# Patient Record
Sex: Female | Born: 1955 | Race: White | Hispanic: No | Smoking: Former smoker
Health system: Southern US, Community
[De-identification: ages and names within clinical notes are randomized; demographics above are authoritative.]

## PROBLEM LIST (undated history)

## (undated) DIAGNOSIS — Z8709 Personal history of other diseases of the respiratory system: Secondary | ICD-10-CM

## (undated) DIAGNOSIS — D649 Anemia, unspecified: Secondary | ICD-10-CM

## (undated) DIAGNOSIS — Z8041 Family history of malignant neoplasm of ovary: Secondary | ICD-10-CM

## (undated) DIAGNOSIS — F32A Depression, unspecified: Secondary | ICD-10-CM

## (undated) DIAGNOSIS — I10 Essential (primary) hypertension: Secondary | ICD-10-CM

## (undated) DIAGNOSIS — Z862 Personal history of diseases of the blood and blood-forming organs and certain disorders involving the immune mechanism: Secondary | ICD-10-CM

## (undated) DIAGNOSIS — D219 Benign neoplasm of connective and other soft tissue, unspecified: Secondary | ICD-10-CM

## (undated) DIAGNOSIS — K219 Gastro-esophageal reflux disease without esophagitis: Secondary | ICD-10-CM

## (undated) DIAGNOSIS — Z973 Presence of spectacles and contact lenses: Secondary | ICD-10-CM

## (undated) DIAGNOSIS — Z8489 Family history of other specified conditions: Secondary | ICD-10-CM

## (undated) DIAGNOSIS — T7840XA Allergy, unspecified, initial encounter: Secondary | ICD-10-CM

## (undated) DIAGNOSIS — M1990 Unspecified osteoarthritis, unspecified site: Secondary | ICD-10-CM

## (undated) DIAGNOSIS — J189 Pneumonia, unspecified organism: Secondary | ICD-10-CM

## (undated) DIAGNOSIS — Z9889 Other specified postprocedural states: Secondary | ICD-10-CM

## (undated) DIAGNOSIS — F329 Major depressive disorder, single episode, unspecified: Secondary | ICD-10-CM

## (undated) DIAGNOSIS — R112 Nausea with vomiting, unspecified: Secondary | ICD-10-CM

## (undated) HISTORY — PX: ESOPHAGOGASTRODUODENOSCOPY: SHX1529

## (undated) HISTORY — DX: Allergy, unspecified, initial encounter: T78.40XA

## (undated) HISTORY — PX: JOINT REPLACEMENT: SHX530

## (undated) HISTORY — DX: Benign neoplasm of connective and other soft tissue, unspecified: D21.9

## (undated) HISTORY — PX: COLONOSCOPY: SHX174

## (undated) HISTORY — DX: Family history of malignant neoplasm of ovary: Z80.41

## (undated) HISTORY — DX: Personal history of diseases of the blood and blood-forming organs and certain disorders involving the immune mechanism: Z86.2

---

## 1998-11-18 ENCOUNTER — Other Ambulatory Visit: Admission: RE | Admit: 1998-11-18 | Discharge: 1998-11-18 | Payer: Self-pay | Admitting: Gynecology

## 2000-06-15 ENCOUNTER — Other Ambulatory Visit: Admission: RE | Admit: 2000-06-15 | Discharge: 2000-06-15 | Payer: Self-pay | Admitting: Gynecology

## 2000-06-21 ENCOUNTER — Encounter: Admission: RE | Admit: 2000-06-21 | Discharge: 2000-06-21 | Payer: Self-pay | Admitting: Gynecology

## 2000-06-21 ENCOUNTER — Encounter: Payer: Self-pay | Admitting: Gynecology

## 2000-06-28 ENCOUNTER — Encounter: Admission: RE | Admit: 2000-06-28 | Discharge: 2000-06-28 | Payer: Self-pay | Admitting: Sports Medicine

## 2000-07-01 ENCOUNTER — Encounter: Admission: RE | Admit: 2000-07-01 | Discharge: 2000-07-01 | Payer: Self-pay | Admitting: Sports Medicine

## 2000-07-01 ENCOUNTER — Encounter: Payer: Self-pay | Admitting: Sports Medicine

## 2000-07-05 ENCOUNTER — Encounter: Admission: RE | Admit: 2000-07-05 | Discharge: 2000-07-05 | Payer: Self-pay | Admitting: Sports Medicine

## 2000-07-06 ENCOUNTER — Encounter: Admission: RE | Admit: 2000-07-06 | Discharge: 2000-07-06 | Payer: Self-pay | Admitting: Sports Medicine

## 2000-07-30 ENCOUNTER — Encounter: Admission: RE | Admit: 2000-07-30 | Discharge: 2000-07-30 | Payer: Self-pay | Admitting: Sports Medicine

## 2000-08-30 ENCOUNTER — Encounter: Admission: RE | Admit: 2000-08-30 | Discharge: 2000-08-30 | Payer: Self-pay | Admitting: Sports Medicine

## 2000-09-27 ENCOUNTER — Encounter: Admission: RE | Admit: 2000-09-27 | Discharge: 2000-09-27 | Payer: Self-pay | Admitting: Sports Medicine

## 2001-06-16 ENCOUNTER — Encounter: Admission: RE | Admit: 2001-06-16 | Discharge: 2001-06-16 | Payer: Self-pay | Admitting: Family Medicine

## 2001-06-16 ENCOUNTER — Encounter: Payer: Self-pay | Admitting: Family Medicine

## 2001-09-13 ENCOUNTER — Other Ambulatory Visit: Admission: RE | Admit: 2001-09-13 | Discharge: 2001-09-13 | Payer: Self-pay | Admitting: Gynecology

## 2003-01-02 ENCOUNTER — Other Ambulatory Visit: Admission: RE | Admit: 2003-01-02 | Discharge: 2003-01-02 | Payer: Self-pay | Admitting: Gynecology

## 2003-01-31 ENCOUNTER — Encounter: Admission: RE | Admit: 2003-01-31 | Discharge: 2003-01-31 | Payer: Self-pay | Admitting: Sports Medicine

## 2004-06-04 ENCOUNTER — Other Ambulatory Visit: Admission: RE | Admit: 2004-06-04 | Discharge: 2004-06-04 | Payer: Self-pay | Admitting: Gynecology

## 2004-06-17 ENCOUNTER — Ambulatory Visit: Payer: Self-pay | Admitting: Sports Medicine

## 2004-06-30 ENCOUNTER — Encounter: Admission: RE | Admit: 2004-06-30 | Discharge: 2004-06-30 | Payer: Self-pay | Admitting: Gynecology

## 2004-09-26 ENCOUNTER — Ambulatory Visit: Payer: Self-pay | Admitting: Sports Medicine

## 2004-10-28 ENCOUNTER — Ambulatory Visit: Payer: Self-pay | Admitting: Sports Medicine

## 2005-11-03 ENCOUNTER — Other Ambulatory Visit: Admission: RE | Admit: 2005-11-03 | Discharge: 2005-11-03 | Payer: Self-pay | Admitting: Gynecology

## 2006-02-23 ENCOUNTER — Ambulatory Visit: Payer: Self-pay | Admitting: Family Medicine

## 2006-02-24 ENCOUNTER — Encounter: Admission: RE | Admit: 2006-02-24 | Discharge: 2006-02-24 | Payer: Self-pay | Admitting: Sports Medicine

## 2006-03-23 ENCOUNTER — Ambulatory Visit: Payer: Self-pay | Admitting: Family Medicine

## 2006-10-06 ENCOUNTER — Encounter: Admission: RE | Admit: 2006-10-06 | Discharge: 2006-10-06 | Payer: Self-pay | Admitting: Gynecology

## 2006-10-11 ENCOUNTER — Encounter: Admission: RE | Admit: 2006-10-11 | Discharge: 2006-10-11 | Payer: Self-pay | Admitting: Gynecology

## 2007-01-24 ENCOUNTER — Other Ambulatory Visit: Admission: RE | Admit: 2007-01-24 | Discharge: 2007-01-24 | Payer: Self-pay | Admitting: Gynecology

## 2007-11-03 ENCOUNTER — Encounter: Admission: RE | Admit: 2007-11-03 | Discharge: 2007-11-03 | Payer: Self-pay | Admitting: Gynecology

## 2008-08-07 ENCOUNTER — Ambulatory Visit: Payer: Self-pay | Admitting: Internal Medicine

## 2008-09-04 ENCOUNTER — Encounter: Admission: RE | Admit: 2008-09-04 | Discharge: 2008-09-04 | Payer: Self-pay | Admitting: Internal Medicine

## 2008-09-04 ENCOUNTER — Ambulatory Visit: Payer: Self-pay | Admitting: Internal Medicine

## 2008-12-05 ENCOUNTER — Encounter: Admission: RE | Admit: 2008-12-05 | Discharge: 2008-12-05 | Payer: Self-pay | Admitting: Gynecology

## 2009-05-20 ENCOUNTER — Ambulatory Visit: Payer: Self-pay | Admitting: Internal Medicine

## 2009-07-17 ENCOUNTER — Ambulatory Visit: Payer: Self-pay | Admitting: Sports Medicine

## 2009-07-17 DIAGNOSIS — M76899 Other specified enthesopathies of unspecified lower limb, excluding foot: Secondary | ICD-10-CM | POA: Insufficient documentation

## 2009-07-17 DIAGNOSIS — M1611 Unilateral primary osteoarthritis, right hip: Secondary | ICD-10-CM | POA: Insufficient documentation

## 2009-09-27 ENCOUNTER — Ambulatory Visit: Payer: Self-pay | Admitting: Internal Medicine

## 2010-01-28 ENCOUNTER — Ambulatory Visit: Payer: Self-pay | Admitting: Internal Medicine

## 2010-02-12 ENCOUNTER — Ambulatory Visit: Payer: Self-pay | Admitting: Internal Medicine

## 2010-03-04 ENCOUNTER — Ambulatory Visit: Payer: Self-pay | Admitting: Internal Medicine

## 2010-03-18 ENCOUNTER — Ambulatory Visit: Payer: Self-pay | Admitting: Internal Medicine

## 2010-03-19 ENCOUNTER — Encounter: Admission: RE | Admit: 2010-03-19 | Discharge: 2010-03-19 | Payer: Self-pay | Admitting: Internal Medicine

## 2010-06-24 ENCOUNTER — Encounter: Admission: RE | Admit: 2010-06-24 | Discharge: 2010-06-24 | Payer: Self-pay | Admitting: Gynecology

## 2010-07-17 ENCOUNTER — Ambulatory Visit: Payer: Self-pay | Admitting: Internal Medicine

## 2010-10-01 ENCOUNTER — Other Ambulatory Visit: Payer: Self-pay | Admitting: Family Medicine

## 2010-10-01 ENCOUNTER — Ambulatory Visit
Admission: RE | Admit: 2010-10-01 | Discharge: 2010-10-01 | Disposition: A | Discharge status: Home / Self Care | Payer: 59 | Source: Ambulatory Visit | Attending: Family Medicine | Admitting: Family Medicine

## 2010-10-01 ENCOUNTER — Ambulatory Visit (INDEPENDENT_AMBULATORY_CARE_PROVIDER_SITE_OTHER): Payer: 59 | Admitting: Sports Medicine

## 2010-10-01 ENCOUNTER — Encounter: Payer: Self-pay | Admitting: Sports Medicine

## 2010-10-01 DIAGNOSIS — M25559 Pain in unspecified hip: Secondary | ICD-10-CM

## 2010-10-01 DIAGNOSIS — M25539 Pain in unspecified wrist: Secondary | ICD-10-CM | POA: Insufficient documentation

## 2010-10-01 DIAGNOSIS — M545 Low back pain, unspecified: Secondary | ICD-10-CM

## 2010-10-01 DIAGNOSIS — M25519 Pain in unspecified shoulder: Secondary | ICD-10-CM

## 2010-10-08 NOTE — Assessment & Plan Note (Addendum)
Summary: HIP PAIN/MJD [phone]   Vital Signs:  Patient profile:   55 year old female BP sitting:   133 / 84  Vitals Entered By: Lillia Pauls CMA (October 01, 2010 8:42 AM)  History of Present Illness: 55 yo F h/o Rt troch bursitis injected by Jakiah Bienaime over 1 year ago here with multiple complaints.  1.  B/l low back pain - present for last 3-4 months.  Located at top of buttock.  Denies new injury.  Sometimes comes around lateral aspect of Rt hip and into groin, sometimes down leg into her knee.  Has not radiated into Lt hip or leg.  Pain with initially getting out of seated position and into standing position.  Pain gets so bad when walks sometimes that she has to stop.  2.  Rt hip/groin pain - see above.  has noticed some increasing pain in b/l lateral hips, Rt > Lt.  Also with Rt groin pain as above.  3.  B/l hand numbness/tingling - present for several months as well, seems to be getting worse.  Worst at night, gets this in thumb and index finger.  Doesn't feel like radiating from neck.  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  4.  Upper back/b/l shoulder pain - present for a couple months.  seems to have gotten worse after dad died 6 weeks ago, which is causing lots of stress.   Not sleeping well through the night.  Present constantly, worst with lying on her back.  Physical Exam  General:  overweight-appearing.   Head:  normocephalic.   Eyes:  vision grossly intact.   Neck:  supple.  FROM.  neg spurling's b/l Lungs:  normal respiratory effort.   Msk:  Shoulders: FROM.  Nl RC strength.  + spasm/TTP diffusely across b/l traps and parascapular muscles  Spine: + ttp b/l lower lumbar muscles. Mild TTP over SI joints.  No bony ttp.  Neg seated SLR seated and supine.  Limited ROM with flexion/ext/b/l lateral flexion.  Neuro: nl LE strength and reflexes  Hips: Nl ROM b/l.  Neg log roll b/l.  + Stinchfield test on Rt.  Rt sided TTP about buttock and lateral hip.  Wrists: nl ROM.  Mildly + Tinel's and Durkin's.  Strength and muscle tone intact Neurologic:  alert & oriented X3.     Impression & Recommendations:  Problem # 1:  HIP PAIN, RIGHT (ICD-719.45) Concerning for hip path such as OA. - check AP pelvis - mobic once daily - stretches and low impact exercise - f/u 1 month  Her updated medication list for this problem includes:    Mobic 15 Mg Tabs (Meloxicam) ..... One by mouth once daily  Orders: Radiology other (Radiology Other)  Problem # 2:  LUMBAGO (ICD-724.2) muscular in nature.  no evidence of nerve impingement. - mobic once daily - discussed ROM and strength exercises - f/u 1 month - I think amitriptyline will help this as well  Her updated medication list for this problem includes:    Mobic 15 Mg Tabs (Meloxicam) ..... One by mouth once daily  Problem # 3:  SHOULDER PAIN, BILATERAL (ICD-719.41) More upper back and parascapular pathology.  I think related to muscle tension, spasm, and stress - mobic once daily - amitriptyline 25 mg at bedtime and titrate up to effective sleep pattern - f/u 1 month  Her updated medication list for this problem includes:    Mobic 15 Mg Tabs (Meloxicam) ..... One by mouth once daily  Problem # 4:  WRIST PAIN, BILATERAL (ICD-719.43) sounds like she may have had h/o CTS and has used wrist braces before.  Does not appear to be cervical radiculopathy - mobic once daily - restart night time wrist splints - f/u 1 month  >30 min spent in face to face time, > 50% spent on diagnosis, prognosis, and treatment  Complete Medication List: 1)  Mobic 15 Mg Tabs (Meloxicam) .... One by mouth once daily 2)  Amitriptyline Hcl 25 Mg Tabs (Amitriptyline hcl) .... One at bedtime Prescriptions: AMITRIPTYLINE HCL 25 MG TABS (AMITRIPTYLINE HCL) ONE at bedtime  #60 x 0   Entered by:   Lillia Pauls CMA   Authorized by:   Corbin Ade MD   Signed by:   Lillia Pauls CMA on 10/01/2010   Method used:   Electronically to         [facility]* (retail)       [address]       [place], [place]  [id number]       Ph: [phone]       Fax: [phone]   RxID:   [id number] MOBIC 15 MG TABS (MELOXICAM) ONE by mouth once daily  #30 x 1   Entered by:   Lillia Pauls CMA   Authorized by:   Corbin Ade MD   Signed by:   Lillia Pauls CMA on 10/01/2010   Method used:   Electronically to        [facility]* (retail)       [address]       [place], [place]  [address]       Ph: [phone]       Fax: [phone]   RxID:   [id number]    Orders Added: 1)  Radiology other [Radiology Other] 2)  Est. Patient Level IV [23557]  Appended Document: HIP PAIN/MJD [phone] We will adjust her medicines.  Too groggy.  cut amitriptyline to 10 @ hs. Stop Mobic and use ibuprofen during day. Try 1 hydrocodone at hs as needed.

## 2010-10-14 ENCOUNTER — Encounter (INDEPENDENT_AMBULATORY_CARE_PROVIDER_SITE_OTHER): Payer: Self-pay | Admitting: *Deleted

## 2010-10-22 ENCOUNTER — Ambulatory Visit: Payer: 59 | Admitting: Sports Medicine

## 2010-10-23 NOTE — Miscellaneous (Signed)
  Clinical Lists Changes  Medications: Added new medication of AMITRIPTYLINE HCL 10 MG TABS (AMITRIPTYLINE HCL) take 1 by mouth at bedtime - Signed Added new medication of HYDROCODONE-ACETAMINOPHEN 5-325 MG TABS (HYDROCODONE-ACETAMINOPHEN) take 1-2 at bedtime as needed - Signed Rx of AMITRIPTYLINE HCL 10 MG TABS (AMITRIPTYLINE HCL) take 1 by mouth at bedtime;  #30 x 2;  Signed;  Entered by: Rochele Pages RN;  Authorized by: Enid Baas MD;  Method used: Electronically to [facility]*, [address], [place], [place]  [id number], Ph: [phone], Fax: [phone] Rx of HYDROCODONE-ACETAMINOPHEN 5-325 MG TABS (HYDROCODONE-ACETAMINOPHEN) take 1-2 at bedtime as needed;  #60 x 0;  Signed;  Entered by: Rochele Pages RN;  Authorized by: Enid Baas MD;  Method used: Telephoned to [facility]*, [address], [place], [place]  [address], Ph: [phone], Fax: [phone]    Prescriptions: HYDROCODONE-ACETAMINOPHEN 5-325 MG TABS (HYDROCODONE-ACETAMINOPHEN) take 1-2 at bedtime as needed  #60 x 0   Entered by:   Rochele Pages RN   Authorized by:   Enid Baas MD   Signed by:   Rochele Pages RN on 10/14/2010   Method used:   Telephoned to ...       [facility]* (retail)       [address]       [place], [place]  [address]       Ph: [phone]       Fax: [phone]   RxID:   [id number] AMITRIPTYLINE HCL 10 MG TABS (AMITRIPTYLINE HCL) take 1 by mouth at bedtime  #30 x 2   Entered by:   Rochele Pages RN   Authorized by:   Enid Baas MD   Signed by:   Rochele Pages RN on 10/14/2010   Method used:   Electronically to        [facility]* (retail)       [address]       [place], [place]  [address]       Ph: [phone]       Fax: [phone]   RxID:   [id number]

## 2010-10-29 ENCOUNTER — Encounter: Payer: Self-pay | Admitting: Sports Medicine

## 2010-10-29 ENCOUNTER — Ambulatory Visit (INDEPENDENT_AMBULATORY_CARE_PROVIDER_SITE_OTHER): Payer: 59 | Admitting: Sports Medicine

## 2010-10-29 DIAGNOSIS — M545 Low back pain, unspecified: Secondary | ICD-10-CM

## 2010-10-29 DIAGNOSIS — M25559 Pain in unspecified hip: Secondary | ICD-10-CM

## 2010-10-29 DIAGNOSIS — M25519 Pain in unspecified shoulder: Secondary | ICD-10-CM

## 2010-11-04 NOTE — Assessment & Plan Note (Signed)
Summary: F/U HIP/ARM,MC   Vital Signs:  Patient profile:   55 year old female Height:      68 inches Weight:      250 pounds BMI:     38.15 Pulse rate:   85 / minute BP sitting:   119 / 76  (right arm)  Vitals Entered By: Rochele Pages RN (October 29, 2010 9:01 AM) CC: f/u hips and shoulders   CC:  f/u hips and shoulders.  History of Present Illness: Pt presents to clinic for follow up of bilat hip, shoulder, and arm pain.   Hips are 40% improved.  Pain is more localized in low back and posterior hip area, instead of entire low body.  Flexion and twisting at the waist is painful. Pain less intense at night now.  Meloxicam is helpful.  Doing HEP, and trying to do more walking for exercise.  Went on trip to [place] last weekend, did significant walking.  Most pain in hips with she stopped walking. She still cannot touch her toes which she is normally able to do.  Arm/hand numbness  not improved- r>l.  Worse when lying down at night.  Amitriptylene very sedative for her, taking 10 mg instead of 25mg .    New bilat upper back pain but worse on the left. It is located between shoulder blades x1 week.    Preventive Screening-Counseling & Management  Alcohol-Tobacco     Smoking Status: quit  Allergies (verified): 1)  ! Codeine 2)  ! Tramadol Hcl  Social History: Smoking Status:  quit  Physical Exam  General:  Well-developed,well-nourished,in no acute distress; alert,appropriate and cooperative throughout examination. overweight-appearing.   Head:  no abnormalities observed.   Eyes:  vision grossly intact.   Ears:  no external deformities.   Nose:  no external deformity.   Lungs:  normal respiratory effort.   Msk:  Neck: Numbness in distribution of C5-C7 Tenderness over the trapezius worse on the left Full neck range of motion Grip strength and sensation normal in bilateral hands Strength good C4 to T1 distribution Reflexes normal  Shoulder: Inspection reveals no abnormalities,  atrophy or asymmetry. Palpation is normal with no tenderness over AC joint or bicipital groove. ROM is full in all planes. Rotator cuff strength normal throughout. No signs of impingement with negative Neer and Hawkin's tests, empty can. Normal scapular function observed. No painful arc and no drop arm sign.  Hip: ROM Seated her RT hip shows only 50 deg of rotatory motion w IR limited to 20 deg Left hip is normal at 40 deg ER and 25 deg IR  Strength IR: 5/5, ER: 5/5, Flexion: 5/5, Extension: 5/5, Abduction: 5/5, Adduction: 5/5 Pelvic alignment unremarkable to inspection and palpation. Standing hip rotation and gait without trendelenburg / unsteadiness. Greater trochanter without tenderness to palpation. No SI joint tenderness and normal minimal SI movement.  Back Exam: Inspection: Normal Motion: Flexion: pain at more than 60 deg Extension feels good and is normal SLR seated:                          SLR lying: XSLR seated:                        XSLR lying: Palpable tenderness: None         Impression & Recommendations:  Problem # 1:  HIP PAIN, RIGHT (ICD-719.45)  Her updated medication list for this problem includes:  Mobic 15 Mg Tabs (Meloxicam) ..... One by mouth once daily    Hydrocodone-acetaminophen 5-325 Mg Tabs (Hydrocodone-acetaminophen) .Marland Kitchen... 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 One by mouth once daily    Hydrocodone-acetaminophen 5-325 Mg Tabs (Hydrocodone-acetaminophen) .Marland Kitchen... Take 1-2 at bedtime as needed   I think most of her shoulder and arm pain is triggered by symptoms from her neck. While she has normal neck range of motion there is tightness over both trapezius muscles and particularly over the left.     we will give her a trial of the cervical collar to use intermittently. Use neck range of motion exercises. Also try tramadol again but only a half a tablet at night to see if she can tolerate this. I would like to be able to increase her dose of amitriptyline as this did help her arm numbness but she is not able to tolerate it secondary to nausea at normal doses  Problem # 3:  LUMBAGO (ICD-724.2)  Her updated medication list for this problem includes:    Mobic 15 Mg Tabs (Meloxicam) ..... One by mouth once daily    Hydrocodone-acetaminophen 5-325 Mg Tabs (Hydrocodone-acetaminophen) .Marland Kitchen... Take 1-2 at bedtime as needed   back is clearly doing better and so we'll primarily encouraged her to keep up labeled walking program and easy motion exercises. We will recheck in 2-3 months.  Complete Medication List: 1)  Mobic 15 Mg Tabs (Meloxicam) .... One by mouth once daily 2)  Amitriptyline Hcl 25 Mg Tabs (Amitriptyline hcl) .... One at bedtime 3)  Amitriptyline Hcl 10 Mg Tabs (Amitriptyline hcl) .... Take 1 by mouth at bedtime 4)  Hydrocodone-acetaminophen 5-325 Mg Tabs (Hydrocodone-acetaminophen) .... Take 1-2 at bedtime as needed   Orders Added: 1)  Est. Patient Level IV [98119]

## 2010-11-18 ENCOUNTER — Other Ambulatory Visit: Payer: Self-pay | Admitting: *Deleted

## 2010-11-18 MED ORDER — TRAMADOL HCL 50 MG PO TABS: ORAL_TABLET | ORAL | Status: DC

## 2010-12-04 ENCOUNTER — Ambulatory Visit (INDEPENDENT_AMBULATORY_CARE_PROVIDER_SITE_OTHER): Payer: 59 | Admitting: Sports Medicine

## 2010-12-04 ENCOUNTER — Encounter: Payer: Self-pay | Admitting: Sports Medicine

## 2010-12-04 VITALS — BP 137/79

## 2010-12-04 DIAGNOSIS — M5412 Radiculopathy, cervical region: Secondary | ICD-10-CM

## 2010-12-04 DIAGNOSIS — R200 Anesthesia of skin: Secondary | ICD-10-CM

## 2010-12-04 DIAGNOSIS — M25559 Pain in unspecified hip: Secondary | ICD-10-CM

## 2010-12-04 DIAGNOSIS — R209 Unspecified disturbances of skin sensation: Secondary | ICD-10-CM

## 2010-12-04 MED ORDER — CELECOXIB 200 MG PO CAPS: 200.0000 mg | ORAL_CAPSULE | Freq: Two times a day (BID) | ORAL | Status: DC

## 2010-12-04 NOTE — Assessment & Plan Note (Signed)
This has improved w Mobic We will try to find another medication that does not cause edema but helps Has used several NSAIDs Try celebrex 200 bid

## 2010-12-04 NOTE — Assessment & Plan Note (Signed)
Cont to use the collar as needed Try combination of 1/2 Vicodin and 1/2 tramadol tab at night as whole vicodin makes her too drowsy  Check cervical spine films  Reck after 4 to 6 wks

## 2010-12-04 NOTE — Progress Notes (Signed)
Subjective:    Patient ID: Stephanie Andrade, female    DOB: January 29, 1956, 55 y.o.   MRN: [id number]  HPI  Pt presents to clinic for f/u of meds. Was taking mobic- which was working well for lower extremity pain, but causes siginificant swelling.  Stopped this 10 days ago.  Take 25 mg tramadol and amitriptyline 10 mg this was working well, but caused drowsiness.  This spontaneously stopped working. Lt arm numbness is distally, rt involves entire arm with lying flat. Having hand numbness which is causing problems with her job.  Has to drop hands and reposition neck to regain feeling in hands.  Wearing cervical collar when she gets home from work- which is helpful, but does not help her sleep.   Has used hydrocodone 1/2 tablet for the past 4 nights, which helps her sleep.  Lower back and Hip symptoms improved while on Mobic and are stable now.    Review of Systems     Objective:   Physical Exam   Obese, NAD C5-T1 good strength Good biceps reflex bilat Good triceps reflex on lt, decreased on rt Neck ROM good    ROM left hip rotational is good;  RT ROM is about 50 deg total with 20 IR  No limp w walking    Assessment & Plan:

## 2010-12-04 NOTE — Patient Instructions (Signed)
Try taking 1/2 tramadol and 1/2 hydrocodone at bedtime Start celebrex 1-2 times daily Monitor if you are getting less sleepiness, and hand/arm numbness tingling.

## 2010-12-05 ENCOUNTER — Ambulatory Visit
Admission: RE | Admit: 2010-12-05 | Discharge: 2010-12-05 | Disposition: A | Discharge status: Home / Self Care | Payer: 59 | Source: Ambulatory Visit | Attending: Sports Medicine | Admitting: Sports Medicine

## 2010-12-05 DIAGNOSIS — R200 Anesthesia of skin: Secondary | ICD-10-CM

## 2011-01-15 ENCOUNTER — Encounter: Payer: Self-pay | Admitting: Sports Medicine

## 2011-01-15 ENCOUNTER — Ambulatory Visit (INDEPENDENT_AMBULATORY_CARE_PROVIDER_SITE_OTHER): Payer: BC Managed Care – PPO | Admitting: Sports Medicine

## 2011-01-15 DIAGNOSIS — M5412 Radiculopathy, cervical region: Secondary | ICD-10-CM

## 2011-01-15 DIAGNOSIS — M545 Low back pain, unspecified: Secondary | ICD-10-CM

## 2011-01-15 MED ORDER — METHOCARBAMOL 500 MG PO TABS: ORAL_TABLET | ORAL | Status: DC

## 2011-01-15 NOTE — Patient Instructions (Signed)
1) Start taking Robaxin 500mg  1 tab by mouth at bedtime - this is a muscle relaxant that should help with your hip/leg pain.  May take it up to every 6-hours if needed, but start mainly at bedtime. 2) Continue motrin as you are doing 3) Continue tramadol & vicodin as you are doing 4) Continue with home exercises & focus on some stretching to help with flexibility.  Yoga would be good to try. 5) Follow-up 4 to 6-weeks, call with any questions/concerns.

## 2011-01-15 NOTE — Assessment & Plan Note (Signed)
Right-sided low back pain with sciatica symptoms radiating into her right lower tremor the and hip - Will start Robaxin at bedtime to help with the symptoms. She is not tolerate amitriptyline in the past due to excessive sedation. - May continue her tramadol and Vicodin as she is doing. - Should continue with home exercises and stretching activities. Would benefit from yoga. - Followup 4-6 weeks.

## 2011-01-15 NOTE — Progress Notes (Signed)
Subjective:    Patient ID: Stephanie Andrade, female    DOB: 1955-09-03, 55 y.o.   MRN: [id number]  HPI 55 year old female to office for followup of cervical radiculopathy and low back pain/sciatica. Neck symptoms are proximally 70% improved, no longer having any numbness and tingling into the left arm, only has residual numbness in third and fourth fingers of her right hand. Has not needed to use her cervical collar for the last 3 weeks. Was taking Celebrex, but stopped 3 days ago because was making her feel funny. Has been using Motrin which is helpful. Also taking tramadol half tab plus Vicodin half tab at bedtime.  Continues to have right-sided low back pain radiating into her right posterior thigh and hip. This radiation does not past the knee. It is increased with activity. Tramadol and hydrocodone are helpful. Has been using Motrin since stopping Celebrex which helps some. Had tried amitriptyline in the past, but was over sedating. She is doing home exercises on a regular basis. Denies any change in bowel or bladder, denies any lower extremity numbness, denies fevers chills, denies saddle anesthesia.   Review of Systems Per history of present illness, otherwise negative    Objective:   Physical Exam GENERAL: Obese, no acute distress, pleasant MSK: - C-spine: Full range of motion without pain. No midline or paraspinal muscle tenderness. Negative Spurling's bilaterally. Normal upper extremity strength bilaterally. - L-spine: Decreased range of motion. Tender palpation along the right paraspinal musculature at L4, L5 and lower right SI joint and piriformis. Negative straight leg raise bilaterally. Normal large term strength. - Hips: Left hip with good range of motion. Right hip range of motion is restricted with 20 internal rotation and 50 external rotation. She has negative logroll. She is walking without a limp. NEURO: DTR +1/4 right triceps, otherwise +2/4 biceps bilaterally, brachial  radialis bilaterally, left tricep. Slightly decreased sensation to light touch over right third finger. VASCULAR:  pulses +2/4 radial artery bilaterally and symmetric        Assessment & Plan:

## 2011-01-15 NOTE — Assessment & Plan Note (Addendum)
Cervical radiculopathy with residual symptoms on the right along C7 distribution, no longer having symptoms on the left - May continue tramadol and Vicodin as she is doing - Rx for Robaxin given to take at bedtime, this is mainly for her hip/leg, but maybe helpful for her neck as well. 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 - Followup 4-6 weeks for reevaluation

## 2011-01-19 ENCOUNTER — Encounter: Payer: Self-pay | Admitting: Internal Medicine

## 2011-01-19 ENCOUNTER — Ambulatory Visit (INDEPENDENT_AMBULATORY_CARE_PROVIDER_SITE_OTHER): Payer: BC Managed Care – PPO | Admitting: Internal Medicine

## 2011-01-19 VITALS — BP 124/76 | HR 76 | Temp 99.5°F | Ht 64.0 in | Wt 285.0 lb

## 2011-01-19 DIAGNOSIS — J45909 Unspecified asthma, uncomplicated: Secondary | ICD-10-CM

## 2011-01-19 DIAGNOSIS — R5383 Other fatigue: Secondary | ICD-10-CM

## 2011-01-19 DIAGNOSIS — I10 Essential (primary) hypertension: Secondary | ICD-10-CM

## 2011-01-19 DIAGNOSIS — F329 Major depressive disorder, single episode, unspecified: Secondary | ICD-10-CM

## 2011-01-19 DIAGNOSIS — M5410 Radiculopathy, site unspecified: Secondary | ICD-10-CM

## 2011-01-19 DIAGNOSIS — R5381 Other malaise: Secondary | ICD-10-CM

## 2011-01-19 DIAGNOSIS — IMO0001 Reserved for inherently not codable concepts without codable children: Secondary | ICD-10-CM

## 2011-01-19 DIAGNOSIS — IMO0002 Reserved for concepts with insufficient information to code with codable children: Secondary | ICD-10-CM

## 2011-01-19 DIAGNOSIS — M791 Myalgia, unspecified site: Secondary | ICD-10-CM

## 2011-01-19 DIAGNOSIS — F32A Depression, unspecified: Secondary | ICD-10-CM

## 2011-01-19 LAB — COMPREHENSIVE METABOLIC PANEL
ALT: 22 U/L (ref 0–35)
AST: 19 U/L (ref 0–37)
Albumin: 4.5 g/dL (ref 3.5–5.2)
BUN: 17 mg/dL (ref 6–23)
CO2: 28 mEq/L (ref 19–32)
Calcium: 9.4 mg/dL (ref 8.4–10.5)
Chloride: 100 mEq/L (ref 96–112)
Creat: 0.63 mg/dL (ref 0.50–1.10)
Glucose, Bld: 110 mg/dL — ABNORMAL HIGH (ref 70–99)
Potassium: 3.8 mEq/L (ref 3.5–5.3)
Total Bilirubin: 0.3 mg/dL (ref 0.3–1.2)

## 2011-01-19 LAB — CBC WITH DIFFERENTIAL/PLATELET
Basophils Absolute: 0 10*3/uL (ref 0.0–0.1)
Basophils Relative: 0 % (ref 0–1)
Eosinophils Absolute: 0.2 10*3/uL (ref 0.0–0.7)
Hemoglobin: 12.2 g/dL (ref 12.0–15.0)
Lymphocytes Relative: 26 % (ref 12–46)
Lymphs Abs: 2 10*3/uL (ref 0.7–4.0)
MCV: 79 fL (ref 78.0–100.0)
Platelets: 243 10*3/uL (ref 150–400)
RBC: 4.72 MIL/uL (ref 3.87–5.11)

## 2011-01-19 LAB — CK TOTAL AND CKMB (NOT AT ARMC): CK, MB: 1.1 ng/mL (ref 0.3–4.0)

## 2011-01-20 ENCOUNTER — Encounter: Payer: Self-pay | Admitting: Internal Medicine

## 2011-01-20 LAB — SEDIMENTATION RATE: Sed Rate: 12 mm/hr (ref 0–22)

## 2011-01-20 LAB — ANA: Anti Nuclear Antibody(ANA): NEGATIVE

## 2011-02-09 ENCOUNTER — Ambulatory Visit (INDEPENDENT_AMBULATORY_CARE_PROVIDER_SITE_OTHER): Payer: BC Managed Care – PPO | Admitting: Internal Medicine

## 2011-02-09 ENCOUNTER — Encounter: Payer: Self-pay | Admitting: Internal Medicine

## 2011-02-09 VITALS — BP 118/70 | HR 78 | Temp 98.4°F | Ht 64.02 in | Wt 278.0 lb

## 2011-02-09 DIAGNOSIS — M5410 Radiculopathy, site unspecified: Secondary | ICD-10-CM

## 2011-02-09 DIAGNOSIS — J45909 Unspecified asthma, uncomplicated: Secondary | ICD-10-CM

## 2011-02-09 DIAGNOSIS — IMO0002 Reserved for concepts with insufficient information to code with codable children: Secondary | ICD-10-CM

## 2011-02-09 DIAGNOSIS — F3289 Other specified depressive episodes: Secondary | ICD-10-CM

## 2011-02-09 DIAGNOSIS — F329 Major depressive disorder, single episode, unspecified: Secondary | ICD-10-CM

## 2011-02-09 DIAGNOSIS — F32A Depression, unspecified: Secondary | ICD-10-CM

## 2011-02-09 DIAGNOSIS — I10 Essential (primary) hypertension: Secondary | ICD-10-CM

## 2011-02-09 MED ORDER — BUPROPION HCL ER (XL) 300 MG PO TB24: 300.0000 mg | ORAL_TABLET | ORAL | Status: DC

## 2011-02-11 ENCOUNTER — Encounter: Payer: Self-pay | Admitting: Internal Medicine

## 2011-02-11 DIAGNOSIS — F32A Depression, unspecified: Secondary | ICD-10-CM | POA: Insufficient documentation

## 2011-02-11 DIAGNOSIS — I10 Essential (primary) hypertension: Secondary | ICD-10-CM | POA: Insufficient documentation

## 2011-02-11 DIAGNOSIS — J45909 Unspecified asthma, uncomplicated: Secondary | ICD-10-CM | POA: Insufficient documentation

## 2011-02-11 DIAGNOSIS — F329 Major depressive disorder, single episode, unspecified: Secondary | ICD-10-CM | POA: Insufficient documentation

## 2011-02-11 NOTE — Patient Instructions (Signed)
Take Sterapred DS 12 day Dosepak as prescribed. We will obtain MRI of LS-spine as soon as possible and make further recommendations at that time

## 2011-02-11 NOTE — Patient Instructions (Signed)
Start Wellbutrin XL 150 mg daily. Return in 3 weeks

## 2011-02-11 NOTE — Progress Notes (Signed)
Subjective:    Patient ID: Stephanie Andrade, female    DOB: 1956/03/19, 55 y.o.   MRN: [id number]  HPI this is a followup visit regarding depression after death of her father, stress with alcoholic brother, chronic pain right buttock radiating down right posterior lateral leg. Also has history of asthma and hypertension. Remote history of sarcoidosis 1995. Who recently did a number of rheumatology studies because she was complaining of some diffuse pain and aching. ANA was negative, total CK within normal limits, CBC, comprehensive metabolic panel, TSH within normal limits. She was started on Wellbutrin XL 150 mg daily at last visit and is feeling better with regard to depression and decreased energy. She and her husband have been trying to walk in the late evenings. She is morbidly obese. She's been taking some tramadol for pain but it causes some nausea. If she takes it at bedtime she doesn't usually get nauseated. Sometimes she feels like her leg is going to give way and she's noticed some weakness in her right leg. She's also been taking some Robaxin. These were prescribed by Dr. Roanna Epley about a year ago. She has not had an MRI of her LS spine.    Review of Systems     Objective:   Physical Exam affect seems brighter. Straight leg raising positive on the right muscle strength 5/5 left lower extremity and 4/5 right lower extremity. Deep tendon reflexes in the knees 1+ and symmetrical;   absent in the ankles.        Assessment & Plan:  1-depression improved on Wellbutrin XL 150 mg daily. Continue with same dose. Patient says she has tried 300 mg in the past and did not do well on that.  2-right buttock and leg pain-this is been a somewhat confusing history. Apparently has tried Amitriptyline, Mobic, and Celebrex without relief or with side effects. Has been dealing with this apparently for about a year. It's been a bit difficult trying to sort out whether or not this was right hip pain  or radiculopathy the right leg but I feel at this point in time it's likely to be radiculopathy. We're going to get an MRI of the LS spine and reevaluate after that. We'll make further recommendations after MRI results are reviewed. In the meantime, she is going to take a Sterapred DS 10 mg 12 day Dosepak to see if that gives her any relief. She has Vicodin on hand which she takes sparingly for pain as it causes nausea  3-hypertension- well controlled on Benicar

## 2011-02-11 NOTE — Progress Notes (Signed)
Subjective:    Patient ID: Stephanie Andrade, female    DOB: 04-Oct-1955, 55 y.o.   MRN: [id number]  HPI patient in today for an acute visit complaining of "aching all over ". Admits to being depressed since the death of her father. Has recently learned that her brother a recovering alcoholic he started drinking once again. She has a known dog with chronic health problems that she is quite worried about as well. The dog is up a lot at night and patient is not sleeping well. She has a history of hypertension treated with Benicar/HCT 40/12.5 since 2008. She has a history of asthma and is on when necessary albuterol inhaler and uses Advair 250/50 twice a day. Remote history of sarcoidosis in 1995 but not symptomatic. Apparently has been seen at [name] in the past year and has been tried on amitriptyline, tramadol, hydrocodone, Mobic, Celebrex, Robaxin. Has pain in her right buttock and right hip radiating down her leg. I think initially it was felt that she may have had trochanteric bursitis. She had a hip x-ray of the neck x-ray she says. Has not had an MRI of her back. Was treated here 07/17/2010 for neck pain radiating down both arms for 2 weeks. Was given a 12 day Sterapred DS Dosepak, Vicodin and Flexeril. In July 2011 we treated her for low back strain with a Sterapred DS 10 mg 12 day Dosepak. Usually once or twice a year has a bout of asthmatic bronchitis. Patient's mother died with ovarian cancer. Patient sees Dr. Nicholas Andrade for GYN care.    Review of Systems     Objective:   Physical Exam patient has point tenderness over her right trochanter. Straight leg raising slightly positive on the left negative on the right at 90. Muscle strength 4/5 right lower extremity 5/ 5 left lower extremity. HEENT exam is negative neck is supple without thyromegaly chest clear cardiac exam regular rate and rhythm normal S1 and S2 extremities without edema        Assessment & Plan:  1-possible  radiculopathy right leg vs right trochanteric bursitis  2-depression  3-myalgias-? Myositis or other rheumatology illness remote history of sarcoidosis 1995. Could also be related to depression.? Possible fibromyalgia syndrome.  4-morbid obesity  5-hypertension Will control with Benicar/HCT  Plan is to do some baseline lab work including some rheumatology studies, thyroid functions. Start Wellbutrin XL 150 mg daily and reevaluate in a proximally 3 weeks. She says she's been on Wellbutrin in the remote past and it did well for her.

## 2011-03-01 ENCOUNTER — Ambulatory Visit
Admission: RE | Admit: 2011-03-01 | Discharge: 2011-03-01 | Disposition: A | Discharge status: Home / Self Care | Payer: BC Managed Care – PPO | Source: Ambulatory Visit | Attending: Internal Medicine | Admitting: Internal Medicine

## 2011-03-01 DIAGNOSIS — M5410 Radiculopathy, site unspecified: Secondary | ICD-10-CM

## 2011-03-10 ENCOUNTER — Encounter: Payer: Self-pay | Admitting: Internal Medicine

## 2011-03-10 ENCOUNTER — Other Ambulatory Visit: Payer: Self-pay | Admitting: *Deleted

## 2011-03-10 ENCOUNTER — Ambulatory Visit (INDEPENDENT_AMBULATORY_CARE_PROVIDER_SITE_OTHER): Payer: BC Managed Care – PPO | Admitting: Internal Medicine

## 2011-03-10 DIAGNOSIS — E669 Obesity, unspecified: Secondary | ICD-10-CM

## 2011-03-10 DIAGNOSIS — M5412 Radiculopathy, cervical region: Secondary | ICD-10-CM

## 2011-03-10 DIAGNOSIS — I10 Essential (primary) hypertension: Secondary | ICD-10-CM

## 2011-03-10 DIAGNOSIS — M48062 Spinal stenosis, lumbar region with neurogenic claudication: Secondary | ICD-10-CM | POA: Insufficient documentation

## 2011-03-10 DIAGNOSIS — J45909 Unspecified asthma, uncomplicated: Secondary | ICD-10-CM

## 2011-03-10 MED ORDER — METHOCARBAMOL 500 MG PO TABS: ORAL_TABLET | ORAL | Status: DC

## 2011-03-10 NOTE — Progress Notes (Signed)
Subjective:    Patient ID: Stephanie Andrade, female    DOB: [birth year], 55 y.o.   MRN: [id number]  HPI  patient in today for 2 issues. She has a history of asthma and frequent bouts of bronchitis. Says her breathing has been worse recently and she wakes up wheezing in the middle of the night. Reminded her she should be using Advair inhaler 250/50 twice daily. Doesn't like using albuterol inhaler because it makes her jittery.  Also was recently referred to neurosurgeon for evaluation consideration possibly of epidural steroid injections. She wants to see Dr. Lovell Sheehan. He has taken care of family members in the past. 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 Says the prednisone helped her back symptoms a great deal. She was to take another Sterapred dosepak. Says this will also help her breathing. Told her we could not keep her on chronic prednisone therapy. She is having some issues with pain control. She's been taking Robaxin, tramadol, Vicodin, and ibuprofen. Explained to her that we needed to come up with a regimen that was reasonable. She says some of these drugs make her drowsy if she takes them at work but she needs something to help her sleep.    Review of Systems     Objective:   Physical Exam chest is clear.dictation; cardiac exam regular rate and rhythm; extremities without edema. No change in back exam from previous visit        Assessment & Plan:  Asthma  Obesity  Hypertension  Chronic back pain due to multilevel spondylosis worst at L3-L4 where there is moderately severe central canal narrowing. Moderate to moderately severe central canal stenosis at L2-L3. Small foraminal protrusion at L4-L5 on the right causing mild foraminal narrowing.  Plan is to give her a note her Sterapred DS 10 mg 12 day dosepak. Schedule neurosurgery appointment after 03/20/2011. Refill tramadol 50 mg #60 patient is to take  one half to one by mouth twice daily in the morning and early afternoon. Try Tylox one capsule at bedtime (#30) no refill. Stop Robaxin. Stop Vicodin. Stop ibuprofen.

## 2011-03-11 ENCOUNTER — Telehealth: Payer: Self-pay

## 2011-03-11 NOTE — Telephone Encounter (Signed)
Dr. Baxley aware 

## 2011-04-01 ENCOUNTER — Telehealth: Payer: Self-pay

## 2011-04-01 NOTE — Telephone Encounter (Signed)
Patient would like a referrral to Dr. Herminio Heads. Is unhappy with [facility]. Spoke with Dr, Lenord Fellers via phone who gave me a verbal OK to do referral. Records faxed to them

## 2011-05-07 ENCOUNTER — Other Ambulatory Visit: Payer: Self-pay | Admitting: Sports Medicine

## 2011-05-21 ENCOUNTER — Other Ambulatory Visit: Payer: Self-pay | Admitting: Internal Medicine

## 2011-05-22 ENCOUNTER — Other Ambulatory Visit: Payer: Self-pay | Admitting: Internal Medicine

## 2011-06-16 ENCOUNTER — Telehealth: Payer: Self-pay | Admitting: Internal Medicine

## 2011-06-16 ENCOUNTER — Other Ambulatory Visit: Payer: Self-pay

## 2011-06-16 MED ORDER — ZOLPIDEM TARTRATE 10 MG PO TABS: 10.0000 mg | ORAL_TABLET | Freq: Every evening | ORAL | Status: DC | PRN

## 2011-06-16 NOTE — Telephone Encounter (Signed)
Rx for Ambien 10 mg #30 1/2 - 1 po qhs prn sleep sent to [facility].  Pt aware.

## 2011-07-07 ENCOUNTER — Encounter: Payer: Self-pay | Admitting: Internal Medicine

## 2011-07-07 ENCOUNTER — Ambulatory Visit (INDEPENDENT_AMBULATORY_CARE_PROVIDER_SITE_OTHER): Payer: BC Managed Care – PPO | Admitting: Internal Medicine

## 2011-07-07 DIAGNOSIS — R232 Flushing: Secondary | ICD-10-CM

## 2011-07-19 DIAGNOSIS — R232 Flushing: Secondary | ICD-10-CM | POA: Insufficient documentation

## 2011-07-19 NOTE — Patient Instructions (Signed)
Take prednisone taper and antibiotic sister corrected for bronchitis. Start estrogen replacement as directed as previously prescribed by GYN physician. Consider SSRI for depression

## 2011-07-19 NOTE — Progress Notes (Signed)
Subjective:    Patient ID: Stephanie Andrade, female    DOB: April 08, 1956, 55 y.o.   MRN: [id number]  HPI patient's brother died recently of complications of alcoholism. She had to euthanize her dog because of significant arthritis issues and age. Still grieving for father who died about a year ago. Complaining bitterly of hot flashes. She had epidural steroid injections done by Dr. Nickola Major in   [place] her back pain is better. Is on antidepressant therapy. Complains of fatigue. History of hypertension and asthma. History of depression which seems to be situational. Used to be on estrogen replacement but stopped it about a year ago. Says hot flashes are really unbearable and she's not sleeping. History of obesity but not motivated to diet. Some cough and respiratory congestion.    Review of Systems     Objective:   Physical Exam chest clear to auscultation; cardiac exam regular rate and rhythm; extremities without edema        Assessment & Plan:  Hot flashes related to menopause  Grief reaction  History of depression  Hypertension  History of low back pain  History of asthma  Bronchitis  Plan: Sterapred DS 10 mg 6 day dosepak; Levaquin 500 milligrams daily for 10 days. Restart estrogen replacement previously prescribed by GYN physician. Explained to patient that we could continue with this for about a year and that she should try to taper off again. Consider adding SSRI.

## 2011-08-31 ENCOUNTER — Other Ambulatory Visit: Payer: Self-pay

## 2011-08-31 MED ORDER — ZOLPIDEM TARTRATE 10 MG PO TABS: 10.0000 mg | ORAL_TABLET | Freq: Every evening | ORAL | Status: DC | PRN

## 2011-09-02 ENCOUNTER — Other Ambulatory Visit: Payer: Self-pay | Admitting: Physical Medicine and Rehabilitation

## 2011-09-02 DIAGNOSIS — R209 Unspecified disturbances of skin sensation: Secondary | ICD-10-CM

## 2011-09-02 DIAGNOSIS — M542 Cervicalgia: Secondary | ICD-10-CM

## 2011-09-02 DIAGNOSIS — M5412 Radiculopathy, cervical region: Secondary | ICD-10-CM

## 2011-09-02 DIAGNOSIS — M4802 Spinal stenosis, cervical region: Secondary | ICD-10-CM

## 2011-09-12 ENCOUNTER — Other Ambulatory Visit: Payer: BC Managed Care – PPO

## 2011-09-15 ENCOUNTER — Other Ambulatory Visit: Payer: Self-pay | Admitting: Internal Medicine

## 2011-09-24 ENCOUNTER — Other Ambulatory Visit: Payer: BC Managed Care – PPO

## 2011-10-08 ENCOUNTER — Other Ambulatory Visit: Payer: BC Managed Care – PPO

## 2011-10-16 ENCOUNTER — Other Ambulatory Visit: Payer: BC Managed Care – PPO | Admitting: Internal Medicine

## 2011-10-16 DIAGNOSIS — Z0000 Encounter for general adult medical examination without abnormal findings: Secondary | ICD-10-CM

## 2011-10-16 LAB — CBC WITH DIFFERENTIAL/PLATELET
Basophils Absolute: 0 10*3/uL (ref 0.0–0.1)
Basophils Relative: 1 % (ref 0–1)
Eosinophils Absolute: 0.2 10*3/uL (ref 0.0–0.7)
Eosinophils Relative: 3 % (ref 0–5)
HCT: 38 % (ref 36.0–46.0)
Hemoglobin: 12.2 g/dL (ref 12.0–15.0)
Lymphocytes Relative: 33 % (ref 12–46)
Lymphs Abs: 1.6 10*3/uL (ref 0.7–4.0)
MCH: 25.6 pg — ABNORMAL LOW (ref 26.0–34.0)
MCHC: 32.1 g/dL (ref 30.0–36.0)
MCV: 79.7 fL (ref 78.0–100.0)
Monocytes Absolute: 0.5 10*3/uL (ref 0.1–1.0)
Monocytes Relative: 10 % (ref 3–12)
Neutro Abs: 2.5 10*3/uL (ref 1.7–7.7)
Neutrophils Relative %: 52 % (ref 43–77)
Platelets: 212 10*3/uL (ref 150–400)
RBC: 4.77 MIL/uL (ref 3.87–5.11)
RDW: 13.1 % (ref 11.5–15.5)
WBC: 4.7 10*3/uL (ref 4.0–10.5)

## 2011-10-16 LAB — COMPREHENSIVE METABOLIC PANEL
ALT: 25 U/L (ref 0–35)
AST: 22 U/L (ref 0–37)
Albumin: 4.1 g/dL (ref 3.5–5.2)
Alkaline Phosphatase: 77 U/L (ref 39–117)
BUN: 20 mg/dL (ref 6–23)
CO2: 29 mEq/L (ref 19–32)
Calcium: 8.8 mg/dL (ref 8.4–10.5)
Chloride: 104 mEq/L (ref 96–112)
Creat: 0.88 mg/dL (ref 0.50–1.10)
Glucose, Bld: 85 mg/dL (ref 70–99)
Potassium: 4.6 mEq/L (ref 3.5–5.3)
Sodium: 139 mEq/L (ref 135–145)
Total Bilirubin: 0.4 mg/dL (ref 0.3–1.2)
Total Protein: 6.7 g/dL (ref 6.0–8.3)

## 2011-10-16 LAB — LIPID PANEL
Cholesterol: 158 mg/dL (ref 0–200)
HDL: 46 mg/dL (ref >39)
LDL Cholesterol: 89 mg/dL (ref 0–99)
Total CHOL/HDL Ratio: 3.4 Ratio
Triglycerides: 113 mg/dL (ref <150)
VLDL: 23 mg/dL (ref 0–40)

## 2011-10-16 LAB — TSH: TSH: 0.951 u[IU]/mL (ref 0.350–4.500)

## 2011-10-19 ENCOUNTER — Encounter: Payer: Self-pay | Admitting: Internal Medicine

## 2011-10-19 ENCOUNTER — Ambulatory Visit (INDEPENDENT_AMBULATORY_CARE_PROVIDER_SITE_OTHER): Payer: BC Managed Care – PPO | Admitting: Internal Medicine

## 2011-10-19 VITALS — BP 106/68 | HR 76 | Temp 98.2°F | Ht 68.0 in | Wt 267.0 lb

## 2011-10-19 DIAGNOSIS — F329 Major depressive disorder, single episode, unspecified: Secondary | ICD-10-CM

## 2011-10-19 DIAGNOSIS — D259 Leiomyoma of uterus, unspecified: Secondary | ICD-10-CM

## 2011-10-19 DIAGNOSIS — Z8639 Personal history of other endocrine, nutritional and metabolic disease: Secondary | ICD-10-CM

## 2011-10-19 DIAGNOSIS — D219 Benign neoplasm of connective and other soft tissue, unspecified: Secondary | ICD-10-CM

## 2011-10-19 DIAGNOSIS — F32A Depression, unspecified: Secondary | ICD-10-CM

## 2011-10-19 DIAGNOSIS — M545 Low back pain, unspecified: Secondary | ICD-10-CM

## 2011-10-19 DIAGNOSIS — Z8041 Family history of malignant neoplasm of ovary: Secondary | ICD-10-CM

## 2011-10-19 DIAGNOSIS — I10 Essential (primary) hypertension: Secondary | ICD-10-CM

## 2011-10-19 DIAGNOSIS — Z862 Personal history of diseases of the blood and blood-forming organs and certain disorders involving the immune mechanism: Secondary | ICD-10-CM

## 2011-10-19 DIAGNOSIS — J45909 Unspecified asthma, uncomplicated: Secondary | ICD-10-CM

## 2011-10-19 NOTE — Patient Instructions (Signed)
Continue same meds     Return in 6 months

## 2011-11-15 DIAGNOSIS — Z8639 Personal history of other endocrine, nutritional and metabolic disease: Secondary | ICD-10-CM | POA: Insufficient documentation

## 2011-11-15 DIAGNOSIS — Z862 Personal history of diseases of the blood and blood-forming organs and certain disorders involving the immune mechanism: Secondary | ICD-10-CM | POA: Insufficient documentation

## 2011-11-15 NOTE — Progress Notes (Signed)
Subjective:    Patient ID: Stephanie Andrade, female    DOB: 1956/07/18, 56 y.o.   MRN: [id number]  HPI 56 year old white female with history of hypertension, fibroids, family history of ovarian cancer, history of sarcoidosis diagnosed in 1995, history of asthma, history of vitamin D deficiency for health maintenance and evaluation of medical problems. Left heel fracture 2002. Bilateral carpal tunnel syndrome diagnosed 1997. Patient cannot eat mushrooms they cause hives. Codeine causes her to vomit. Cannot tolerate hydrocodone or Tessalon or dextromethorphan. Is on Benicar HCTZ since 2008 for hypertension. Dr. Nicholas Lose has her on Prometrium and Vivelle-Dot as estrogen replacement therapy. She has Advair inhaler to use twice daily 250/50 and an albuterol inhaler to use his rescue inhaler. Had Tdap Vaccine February 2011.  The past year has been hard for her. She lost her brother who had history of alcoholism and hypertension. A few months before that she lost her father who was in a skilled nursing facility at [facility] with dementia. Mother died at age 50 of ovarian cancer. She had put her dog to sleep the cause of orthopedic issues  Recently.  Social history patient does not smoke. Social alcohol consumption. Has 4 year college degree. Direct weddings and works for [employer]  Additional history of her father and brothers had a history of cholesterol issues. She has one sister in good health. Older brother who is living has hypertension   Review of Systems  Constitutional: Positive for fatigue.  HENT: Negative.   Eyes: Negative.   Respiratory:       History of asthma  Cardiovascular: Negative.   Genitourinary: Negative.   Musculoskeletal: Positive for back pain.  Neurological: Negative.   Hematological: Negative.   Psychiatric/Behavioral:       Depression       Objective:   Physical Exam  Vitals reviewed. Constitutional: She is oriented to person, place, and time. She appears  well-developed and well-nourished. No distress.  HENT:  Head: Normocephalic and atraumatic.  Right Ear: External ear normal.  Left Ear: External ear normal.  Nose: Nose normal.  Mouth/Throat: Oropharynx is clear and moist.  Eyes: Conjunctivae and EOM are normal. Pupils are equal, round, and reactive to light. Right eye exhibits no discharge. Left eye exhibits no discharge. No scleral icterus.  Neck: Neck supple. No JVD present. No thyromegaly present.  Cardiovascular: Normal rate, regular rhythm, normal heart sounds and intact distal pulses.   No murmur heard. Pulmonary/Chest: Effort normal and breath sounds normal. She has no wheezes. She has no rales.       Breasts normal female  Abdominal: Soft. Bowel sounds are normal. She exhibits no distension and no mass. There is no tenderness. There is no rebound and no guarding.  Genitourinary:       Pap deferred to GYN physician  Musculoskeletal: She exhibits no edema.  Lymphadenopathy:    She has no cervical adenopathy.  Neurological: She is alert and oriented to person, place, and time. She has normal reflexes. No cranial nerve deficit. Coordination normal.  Skin: Skin is warm and dry. No rash noted. She is not diaphoretic.  Psychiatric: She has a normal mood and affect. Her behavior is normal. Judgment and thought content normal.          Assessment & Plan:   hypertension  Asthma  Vitamin D deficiency  Family history of ovarian cancer in her mother  History of sarcoidosis diagnosed 1995-nonactive  History bilateral carpal tunnel syndrome 1997  Depression  History  of recurrent back pain  Plan: Patient is to return in 6 months or as needed. Colonoscopy was done in 2009 by Dr. Matthias Hughs.

## 2011-12-29 ENCOUNTER — Other Ambulatory Visit: Payer: Self-pay | Admitting: Internal Medicine

## 2012-02-26 ENCOUNTER — Other Ambulatory Visit: Payer: Self-pay | Admitting: Internal Medicine

## 2012-04-25 ENCOUNTER — Ambulatory Visit (INDEPENDENT_AMBULATORY_CARE_PROVIDER_SITE_OTHER): Payer: BC Managed Care – PPO | Admitting: Internal Medicine

## 2012-04-25 ENCOUNTER — Encounter: Payer: Self-pay | Admitting: Internal Medicine

## 2012-04-25 VITALS — BP 100/56 | HR 80 | Temp 98.8°F | Wt 263.0 lb

## 2012-04-25 DIAGNOSIS — L210 Seborrhea capitis: Secondary | ICD-10-CM

## 2012-04-25 DIAGNOSIS — L219 Seborrheic dermatitis, unspecified: Secondary | ICD-10-CM

## 2012-04-25 DIAGNOSIS — F32A Depression, unspecified: Secondary | ICD-10-CM

## 2012-04-25 DIAGNOSIS — Z23 Encounter for immunization: Secondary | ICD-10-CM

## 2012-04-25 DIAGNOSIS — J45909 Unspecified asthma, uncomplicated: Secondary | ICD-10-CM

## 2012-04-25 DIAGNOSIS — N951 Menopausal and female climacteric states: Secondary | ICD-10-CM

## 2012-04-25 DIAGNOSIS — F329 Major depressive disorder, single episode, unspecified: Secondary | ICD-10-CM

## 2012-04-25 DIAGNOSIS — E669 Obesity, unspecified: Secondary | ICD-10-CM

## 2012-04-25 DIAGNOSIS — M48061 Spinal stenosis, lumbar region without neurogenic claudication: Secondary | ICD-10-CM

## 2012-04-25 DIAGNOSIS — I10 Essential (primary) hypertension: Secondary | ICD-10-CM

## 2012-04-25 MED ORDER — PNEUMOCOCCAL VAC POLYVALENT 25 MCG/0.5ML IJ INJ: 0.5000 mL | INJECTION | INTRAMUSCULAR | Status: AC

## 2012-04-25 NOTE — Patient Instructions (Addendum)
Start Cozaar 50 mg daily when you run out of Benicar. Return after being on Cozaar for 30 days. Have pulmonary functions done. You have been given pneumovax vaccine today.

## 2012-05-05 ENCOUNTER — Encounter (HOSPITAL_COMMUNITY): Payer: BC Managed Care – PPO

## 2012-05-17 NOTE — Progress Notes (Signed)
Subjective:    Patient ID: Stephanie Andrade, female    DOB: May 21, 1956, 56 y.o.   MRN: [id number]  HPI 56 year old obese white female in today for followup of hypertension. She takes Benicar HCTZ. History of musculoskeletal pain which has improved. History of asthma for which she uses Advair inhaler, Singulair, albuterol inhaler. Remote history of sarcoidosis. History of lumbar spinal stenosis. History of hot flashes due to menopause. History of vitamin D deficiency. History of depression. History of insomnia. Complaining about cost of Benicar.  She has declined influenza immunization. Agrees to have Pneumovax immunization which was given today  Today complains of itchy scalp and says she's tried numerous over-the-counter products and also has tried Nizoral shampoo. I do not know what else to offer and she will need to see dermatologist for some special shampoo to which is likely to be tar based.    Review of Systems     Objective:   Physical Exam itchy flaky scalp consistent with seborrhea; chest clear to auscultation; no rales or wheezing; cardiac exam regular rate and rhythm normal S1 and S2. Extremities without edema. Affect is appropriate. Skin is warm and dry.        Assessment & Plan:  Obesity  Hypertension-stable on current regimen  Seborrhea of scalp  Musculoskeletal pain  Lumbar spinal stenosis  Depression  Insomnia  Asthma   Plan: Patient complaining of caused the Benicar. Switch to Cozaar 50 mg daily and return in 30 days. Otherwise Continue same medications. See dermatologist for seborrhea of scalp. Also [facility] is recommending you have pulmonary function testing and this will be arranged

## 2012-06-10 ENCOUNTER — Ambulatory Visit (INDEPENDENT_AMBULATORY_CARE_PROVIDER_SITE_OTHER): Payer: BC Managed Care – PPO | Admitting: Family Medicine

## 2012-06-10 VITALS — BP 100/60 | Ht 68.0 in | Wt 250.0 lb

## 2012-06-10 DIAGNOSIS — M629 Disorder of muscle, unspecified: Secondary | ICD-10-CM

## 2012-06-10 DIAGNOSIS — S93699A Other sprain of unspecified foot, initial encounter: Secondary | ICD-10-CM

## 2012-06-10 MED ORDER — NITROGLYCERIN 0.2 MG/HR TD PT24: MEDICATED_PATCH | TRANSDERMAL | Status: DC

## 2012-06-10 NOTE — Patient Instructions (Addendum)
Nitroglycerin Protocol   Apply 1/4 nitroglycerin patch to affected area daily.  Change position of patch within the affected area every 24 hours.  You may experience a headache during the first 1-2 weeks of using the patch, these should subside.  If you experience headaches after beginning nitroglycerin patch treatment, you may take your preferred over the counter pain reliever.  Another side effect of the nitroglycerin patch is skin irritation or rash related to patch adhesive.  Please notify our office if you develop more severe headaches or rash, and stop the patch.  Tendon healing with nitroglycerin patch may require 12 to 24 weeks depending on the extent of injury.  Men should not use if taking Viagra, Cialis, or Levitra.   Do not use if you have migraines or rosacea.   Please follow up in 1 month  Thank you for seeing [place] today!

## 2012-06-16 DIAGNOSIS — S93699A Other sprain of unspecified foot, initial encounter: Secondary | ICD-10-CM | POA: Insufficient documentation

## 2012-06-16 NOTE — Progress Notes (Signed)
Subjective:    Patient ID: Stephanie Andrade, female    DOB: [birth year], 56 y.o.   MRN: [id number]  HPI DOI  06/04/2012 Left heel pain that started acutely when she was walking on the beach. It was sharp and intense causing her to sit down., Felt nauseated. Had to sit there for quite a while until she was able to limp back to the ramp. Since then she is only been able to walk wearing one certain pair of shoes and even with that it is extremely painful. It throbs when she is at rest. When she walks on it is sharp and shooting.  PERTINENT  PMH / PSH: No prior history of injury or surgery to the left foot. Patient is not diabetic  Review of Systems Has noted no swelling of the left foot her she'll, no redness. She's had no fever, sweats or chills. No numbness in her toes on the left.    Objective:   Physical Exam  Vital signs are reviewed GENERAL: Well-developed female no acute distress mildly overweight FOOT: Left. Tender to palpation at the area of the origin of the left plantar fascia. Tenderness extends up into the lateral and medial portions of the calcaneus. Negative Tinel at the branching of the lateral and medial plantar cutaneous nerve. Distally she is neurovascularly intact. There is very small amount of soft tissue swelling noted on the heel back pad. ULTRASOUND: Fairly large disruption but not total tear of letter fascia. There is a lot of edema. There is increased vascularity is seen on Doppler. There is no cortical defect in the calcaneus.      Assessment & Plan:  Plantar fascia rupture traumatic. I'm surprised she's been able to walk on this at all. We will start nitroglycerin patch, I placed her in a sports insoles with scaphoid pads on that side and also added one to the other side because she has medial foot collapse occurring. Limited walking as needed. Elevation. Ice. We were some pain medicine and see her back in one to 2 weeks.

## 2012-06-29 ENCOUNTER — Other Ambulatory Visit: Payer: Self-pay | Admitting: Internal Medicine

## 2012-07-08 ENCOUNTER — Ambulatory Visit (INDEPENDENT_AMBULATORY_CARE_PROVIDER_SITE_OTHER): Payer: BC Managed Care – PPO | Admitting: Family Medicine

## 2012-07-08 VITALS — BP 108/70 | HR 80 | Ht 68.0 in | Wt 250.0 lb

## 2012-07-08 DIAGNOSIS — S93699A Other sprain of unspecified foot, initial encounter: Secondary | ICD-10-CM

## 2012-07-08 DIAGNOSIS — M629 Disorder of muscle, unspecified: Secondary | ICD-10-CM

## 2012-07-08 NOTE — Progress Notes (Signed)
History of present illness: Patient is a very pleasant 56 year old female who is following up for plantar fascia rupture. Patient states that she is approximately 50% better. Patient has been wearing a nitroglycerin patch and wearing a quarter of it at that time. Patient denies any side effects to this. Patient states that she has been walking a little more and is feeling much better in the sports insoles as well. Patient states that she's not taking any pain medications at this time and seems to be doing very well. Patient was told to stop doing any other activities other than just her regular daily activities. Patient denies any swelling or any numbness of the foot. Patient denies any nighttime awakenings.  Review of systems: 14 system review is done and unremarkable as related to the orthopedic problem.  Past medical history, social, surgical and family history all reviewed.    Physical exam Blood pressure 108/70, pulse 80, height 5\' 8"  (1.727 m), weight 250 lb (113.399 kg), last menstrual period 04/06/2011. General: No apparent distress alert and oriented x3 mood and affect somewhat normal but blunted. Respiratory: Patient's recent full sentences and does not appear short of breath Skin: Warm dry intact with no signs of infection or rash Neuro: Cranial nerves II through XII are intact, neurovascularly intact in all extremities with 2+ DTRs and 2+ pulses. Left foot: Patient does not have any erythema or any abnormalities on inspection. Patient does not have any swelling. Patient still is somewhat tender mostly over the medial portion of the calcaneus. Patient is tender along the fascia on the plantar aspect of the foot as well. She is neurovascularly intact distally and is able to bear weight.

## 2012-07-08 NOTE — Assessment & Plan Note (Signed)
Patient is doing considerably better at this time. Patient is having no side effects to the nitroglycerin patch so we'll increase to half a patch daily. In addition this patient was given stretches to start doing on the plantar fascia to try to help slowly. Patient also given exercises to do heel lifts as well as the eccentric. Discussed though that if she has any pain she should back off on this and go back to low activity. In addition if patient has side effects to the nitroglycerin patch at higher dose she will go back to the quarter of a patch daily. Patient will do these interventions and come back in 4-6 weeks for followup. At that time she should have a repeat ultrasound.

## 2012-08-19 ENCOUNTER — Encounter: Payer: Self-pay | Admitting: Sports Medicine

## 2012-08-19 ENCOUNTER — Ambulatory Visit: Payer: BC Managed Care – PPO | Admitting: Family Medicine

## 2012-08-19 ENCOUNTER — Ambulatory Visit (INDEPENDENT_AMBULATORY_CARE_PROVIDER_SITE_OTHER): Payer: BC Managed Care – PPO | Admitting: Sports Medicine

## 2012-08-19 VITALS — BP 121/74 | HR 87 | Ht 68.0 in | Wt 250.0 lb

## 2012-08-19 DIAGNOSIS — M5412 Radiculopathy, cervical region: Secondary | ICD-10-CM

## 2012-08-19 DIAGNOSIS — S93699A Other sprain of unspecified foot, initial encounter: Secondary | ICD-10-CM

## 2012-08-19 DIAGNOSIS — M629 Disorder of muscle, unspecified: Secondary | ICD-10-CM

## 2012-08-19 NOTE — Assessment & Plan Note (Signed)
Patient is now 10 weeks status post injury. On skin today patient does show some healing of the avulsion fracture but very minimal. At this point patient was fitted with new sports insoles and also given arch strap to see if this would be beneficial. Patient will stop doing weightbearing exercises at this time but will do more stretching. Patient can continue the nitroglycerin patch as long as Stephanie Andrade does not have any side effects. Patient will return again in 4 weeks' time for further evaluation. At that time we can consider another ultrasound.

## 2012-08-19 NOTE — Progress Notes (Signed)
History of present illness: Patient is a very pleasant 57 year old female who is following up for plantar fascia rupture. Patient was seen one month ago and did increase her nitroglycerin patch to half patch daily. Patient was also started on her exercises at that time. Since then though she has not made any improvement in states at the exercises seem to be more painful than beneficial. Patient denies any new symptoms such as swelling or numbness of the foot. Patient is still able to do all her activities of daily living. Patient feels that the sports insoles that she was given has broken down at this time and is not helping her like they did previously.  Patient states that she's not taking any pain medications at this time and seems to be doing very well. Patient was told to stop doing any other activities other than just her regular daily activities. Patient denies any swelling or any numbness of the foot. Patient denies any nighttime awakenings.  Patient also has a history of cervical radiculopathy with radiation down the left arm. Patient was wearing a c-collar at night which seemed to be beneficial. Patient is wondering if she can get a new collar today. Denies any new symptoms such as weakening of the upper extremity or worsening pain. Patient states that it only seems to be giving her trouble at night and when she wakes up she has some numbness in the left opportunity.  Review of systems: 14 system review is done and unremarkable as related to the orthopedic problem.  Past medical history, social, surgical and family history all reviewed.    Physical exam Blood pressure 121/74, pulse 87, height 5\' 8"  (1.727 m), weight 250 lb (113.399 kg), last menstrual period 04/06/2011. General: No apparent distress alert and oriented x3 mood and affect somewhat normal but blunted. Respiratory: Patient's recent full sentences and does not appear short of breath Skin: Warm dry intact with no signs of infection  or rash Neuro: Cranial nerves II through XII are intact, neurovascularly intact in all extremities with 2+ DTRs and 2+ pulses. Left foot: Patient does not have any erythema or any abnormalities on inspection. Patient does not have any swelling. Patient still is somewhat tender mostly over the medial portion of the calcaneus. Patient is tender along the fascia on the plantar aspect of the foot as well but much more focal near the calcaneus. She is neurovascularly intact distally and is able to bear weight.  Musculoskeletal ultrasound was performed and interpreted by me today. Patient still has what appears to be an avulsion rupture of the plantar fascia in the middle third of the calcaneus. Patient still has a defect in measures approximately 0.5 cm in length. Patient does have some mild hypoechoic changes but no neovascularization occurring in the area. Patient does have some increasing calcifications at the area of the avulsion signaling some healing properties.

## 2012-08-19 NOTE — Assessment & Plan Note (Signed)
Patient was not examined today but was given a cervical soft collar to help her with her symptoms. Patient though she to return again for further evaluation if this does not seem to relieve any of the pressure.

## 2012-08-19 NOTE — Patient Instructions (Signed)
It is good to see you and happy new year! I am sorry we are not perfect yet. We do see some healing on the scan today.  You can continue the nitro patch if you want.  Start the exercises again but not with your body weight.  Try the new insoles and lets see if that helps.  Try the cervical neck collar for sleeping for your neck. If it doesn't work we do have some medicine to help.  Lets have you come back again in 4 weeks.

## 2012-09-15 ENCOUNTER — Ambulatory Visit (INDEPENDENT_AMBULATORY_CARE_PROVIDER_SITE_OTHER): Payer: BC Managed Care – PPO | Admitting: Sports Medicine

## 2012-09-15 VITALS — BP 125/80 | Ht 68.0 in | Wt 240.0 lb

## 2012-09-15 DIAGNOSIS — S93699A Other sprain of unspecified foot, initial encounter: Secondary | ICD-10-CM

## 2012-09-15 DIAGNOSIS — M629 Disorder of muscle, unspecified: Secondary | ICD-10-CM

## 2012-09-15 DIAGNOSIS — M2420 Disorder of ligament, unspecified site: Secondary | ICD-10-CM

## 2012-09-15 DIAGNOSIS — M5412 Radiculopathy, cervical region: Secondary | ICD-10-CM

## 2012-09-15 NOTE — Progress Notes (Signed)
Patient ID: Stephanie Andrade, female   DOB: 1956-03-25, 57 y.o.   MRN: [id number]  Patient returns for followup of 2 issues.  Cervical radiculopathy- This is doing better. She occasionally takes some ibuprofen which helps but does not regularly use any medications. 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 The biggest challenge comes with sleep if her neck gets in an awkward position she will wake up with more symptoms into her left arm most the time but sometimes the right.  Plantar fascial rupture This is gradually but steadily improved to where she still has some pain but not nearly as severe and she doesn't feel that she has to limp. The pain is less on the medial side of the heel and less on the first step in the morning. There is some pain on the outside of the heel. Arch supports help. The exercises in a seated position it seemed to help.  Physical examination Pleasant and in no acute distress Neck motion Forward flexion extension rotation and lateral bending No pain on motion C4-T1 nerve roots are tested and she has good strength for all  Left plantar fascia Mild tenderness to palpation at the medial insertion No tenderness to palpation at the lateral insertion walking without a significant limp

## 2012-09-15 NOTE — Assessment & Plan Note (Signed)
Uses collar as needed to lessen symptoms  Purchase a pillow to help with sleep at night  Watch her posture and keep up the neck range of motion

## 2012-09-15 NOTE — Assessment & Plan Note (Signed)
Has improved  Use sports shoes but can start using some other shoes with good arch  Add an arch strap  Began home exercise program now with weightbearing  Recheck when necessary if not resolved in 3 months

## 2012-12-02 ENCOUNTER — Other Ambulatory Visit: Payer: Self-pay | Admitting: Internal Medicine

## 2013-02-07 ENCOUNTER — Other Ambulatory Visit: Payer: Self-pay | Admitting: Internal Medicine

## 2013-03-06 ENCOUNTER — Encounter: Payer: Self-pay | Admitting: Internal Medicine

## 2013-03-06 ENCOUNTER — Ambulatory Visit (INDEPENDENT_AMBULATORY_CARE_PROVIDER_SITE_OTHER): Payer: No Typology Code available for payment source | Admitting: Internal Medicine

## 2013-03-06 VITALS — BP 126/78 | HR 92 | Temp 99.6°F | Wt 269.0 lb

## 2013-03-06 DIAGNOSIS — J069 Acute upper respiratory infection, unspecified: Secondary | ICD-10-CM

## 2013-03-06 DIAGNOSIS — J029 Acute pharyngitis, unspecified: Secondary | ICD-10-CM

## 2013-03-06 MED ORDER — PREDNISONE 10 MG PO KIT: PACK | ORAL | Status: DC

## 2013-03-06 MED ORDER — LEVOFLOXACIN 500 MG PO TABS: 500.0000 mg | ORAL_TABLET | Freq: Every day | ORAL | Status: DC

## 2013-03-06 NOTE — Patient Instructions (Addendum)
Take Levaquin 500 mg daily for 7 days. If wheezing develops, begin Prednisone dose pack as directed.

## 2013-03-06 NOTE — Progress Notes (Signed)
Subjective:    Patient ID: Stephanie Andrade, female    DOB: 12-05-1955, 57 y.o.   MRN: [id number]  HPI 57 year old white female with history of hypertension in today with complaint of sore throat onset yesterday. Has malaise and fatigue despite just having been on vacation last week. No fever or shaking chills. Has developed a bit of a cough that is nonproductive. No wheezing.    Review of Systems     Objective:   Physical Exam HEENT exam: TMs are clear. Pharynx is red. Rapid strep screen is negative. Neck is supple without significant adenopathy. Chest clear to auscultation without rales or wheezing.        Assessment & Plan:  Acute URI  Pharyngitis  Hypertension-well-controlled on current regimen  Plan: Levaquin 500 milligrams daily for 7 days. If develops wheezing or protracted coughing, take Sterapred DS 10 mg 12 day dosepak. Needs physical exam Fall 2014

## 2013-03-07 ENCOUNTER — Other Ambulatory Visit: Payer: Self-pay | Admitting: Internal Medicine

## 2013-03-16 ENCOUNTER — Ambulatory Visit (INDEPENDENT_AMBULATORY_CARE_PROVIDER_SITE_OTHER): Payer: No Typology Code available for payment source | Admitting: Sports Medicine

## 2013-03-16 VITALS — BP 124/70 | Ht 68.0 in | Wt 260.0 lb

## 2013-03-16 DIAGNOSIS — S93699A Other sprain of unspecified foot, initial encounter: Secondary | ICD-10-CM

## 2013-03-16 DIAGNOSIS — M25551 Pain in right hip: Secondary | ICD-10-CM

## 2013-03-16 DIAGNOSIS — M25559 Pain in unspecified hip: Secondary | ICD-10-CM

## 2013-03-16 DIAGNOSIS — M629 Disorder of muscle, unspecified: Secondary | ICD-10-CM

## 2013-03-16 MED ORDER — DIAZEPAM 5 MG PO TABS: ORAL_TABLET | ORAL | Status: DC

## 2013-03-16 NOTE — Assessment & Plan Note (Signed)
Hx of PF rupture but current pain seems less likely to be PF and more likely related to gait and progressive calcaneal valgus Pressure over medial heel Wedges placed in shoes Arch strap  Return to have custom orthotics

## 2013-03-16 NOTE — Assessment & Plan Note (Signed)
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

## 2013-03-16 NOTE — Progress Notes (Signed)
Patient ID: Stephanie Andrade, female   DOB: 20-Jan-1956, 57 y.o.   MRN: [id number]  Cervical radiculopathy Collar helps Still gets hand numbness Pillows are a challenge Pain level is tolerable  Left PF Got better with HEP and stretches Now has flared last 2 mos May be related to some of RT hip pain changing walk Pain over calcaneus Some morning pain though  Rt Hip Pain deep in buttocks crease Not over trochanter this time Hurts getting out of low chair Causes tingling down to lateral calf and top of foot/ not thigh  Accupuncture helped with LB sxs from spinal stenosis Does not seem to affect this  PEXAM  Neck position and posture good today  Pain is localized over RT ischial tuberosity No HS weakness but pain with resisted flexion at 90 deg Hip joint ROM normal Mild TTP over piriformis as well Gret Troch is non tender  LT foot Callus along medial calcaneus and pain directly over medial bone margin isertion of PF is minimally TTP Movement of great toe does not cause pain  She has a high arch sitting but progressive arch breakdown and now has calcaneal valgus

## 2013-04-20 ENCOUNTER — Ambulatory Visit (INDEPENDENT_AMBULATORY_CARE_PROVIDER_SITE_OTHER): Payer: No Typology Code available for payment source | Admitting: Sports Medicine

## 2013-04-20 VITALS — BP 130/80 | Ht 68.0 in | Wt 260.0 lb

## 2013-04-20 DIAGNOSIS — S93699A Other sprain of unspecified foot, initial encounter: Secondary | ICD-10-CM

## 2013-04-20 DIAGNOSIS — M629 Disorder of muscle, unspecified: Secondary | ICD-10-CM

## 2013-04-20 NOTE — Progress Notes (Signed)
Patient ID: Stephanie Andrade, female   DOB: [birth year], 56 y.o.   MRN: [id number] Is a 57 year old female who presents to the clinic today for followup of right heel pain secondary to a plantar fascial rupture. Presents today with continued pain in her right heel. The pain is worse when walking or when putting direct pressure to her heel. At today's visit she would like to discuss and consider custom orthotics to help alleviate her symptoms. Mrs. been discussed in the past, however, at this time other measures do not seem to be helping alleviate the problem. She does wear an arch strap with moderate relief at times, but this is only temporary symptomatic relief. She denies any new injury.  Past Medical History  Diagnosis Date  . Essential hypertension, benign   . Fibroids   . Family history of ovarian cancer   . Hidradenitis   . History of sarcoidosis   . Asthma   . Vitamin D deficiency    No past surgical history on file. Allergies  Allergen Reactions  . Codeine   . Dextromethorphan   . Mushroom Extract Complex Hives  . Tessalon Perles    Review of systems as per history of present illness otherwise negative  Examination: BP 130/80  Ht 5\' 8"  (1.727 m)  Wt 260 lb (117.935 kg)  BMI 39.54 kg/m2  LMP 04/06/2011 Is a well-developed well-nourished 57 year old female awake alert oriented in no acute distress  Gait examination:  She supinates bilaterally with excessive pronation upon landing greater on the right than the left, and stands the left-sided supination and right-sided pronation  Foot examination: Pez planus Tenderness to palpation the right plantar fascia and heel. This is most noted at the anterior portion of the calcaneus. There is no evidence of swelling.  Left plantar fascia is nontender  Ankle examination: Normal Thompson's test bilaterally No ligamentous laxity. Normal talar tilt bilaterally with a negative drawer  Her vascular intact bilateral lower choice and equal  pulses.  Patient was fitted for a :  semi-rigid orthotic. The orthotic was heated and afterward the patient stood on the orthotic blank positioned on the orthotic stand. The patient was positioned in subtalar neutral position and 10 degrees of ankle dorsiflexion in a weight bearing stance. After completion of molding, a stable base was applied to the orthotic blank. The blank was ground to a stable position for weight bearing. Size: 10 Base: semi-rigid Posting: none Additional orthotic padding: none  Greater than 45 minutes of time was spent in evaluation with this patient and production of custom orthotics.

## 2013-04-20 NOTE — Assessment & Plan Note (Signed)
Custom orthotics were made the clinic today. Evaluation of her gait after the orthotics revealed marked improvement in balance between her left and right foot with ambulation with decrease in supination and control of the amount of pronation on her right foot.  She'll followup in the clinic as needed.

## 2013-06-05 ENCOUNTER — Other Ambulatory Visit: Payer: Self-pay

## 2013-06-05 ENCOUNTER — Other Ambulatory Visit: Payer: Self-pay | Admitting: Gynecology

## 2013-06-05 DIAGNOSIS — E2839 Other primary ovarian failure: Secondary | ICD-10-CM

## 2013-06-05 DIAGNOSIS — Z1231 Encounter for screening mammogram for malignant neoplasm of breast: Secondary | ICD-10-CM

## 2013-06-09 ENCOUNTER — Ambulatory Visit (INDEPENDENT_AMBULATORY_CARE_PROVIDER_SITE_OTHER): Payer: No Typology Code available for payment source | Admitting: Internal Medicine

## 2013-06-09 ENCOUNTER — Encounter: Payer: Self-pay | Admitting: Internal Medicine

## 2013-06-09 VITALS — BP 122/82 | HR 80 | Temp 97.3°F | Ht 68.0 in | Wt 256.0 lb

## 2013-06-09 DIAGNOSIS — M25559 Pain in unspecified hip: Secondary | ICD-10-CM

## 2013-06-09 DIAGNOSIS — M25551 Pain in right hip: Secondary | ICD-10-CM

## 2013-06-09 NOTE — Progress Notes (Signed)
Subjective:    Patient ID: Stephanie Andrade, female    DOB: 1956/07/27, 57 y.o.   MRN: [id number]  HPI Patient is here with excruciating right hip and right buttock pain. Finding it hard to ambulate today. Can hardly rise from a sitting position and has difficulty getting on exam table. Also having issue with left heel. Has seen sports medicine physicians in the past. No numbness or weakness in the right lower extremity. Can't get comfortable lying flat. While getting on the exam table she developed an extreme cramping in right groin. She has some leftover oxycodone 2.5/325 that she's been taking sparingly for pain and for some hydrocodone 5/325 for pain. Take some Flexeril sometimes but it causes nausea. She is morbidly obese.  She has a history of hypertension and asthma.    Review of Systems     Objective:   Physical Exam  straight leg raising is negative on the right at 90. She has extreme pain with internal rotation of right hip. Less pain with external rotation of right hip.        Assessment & Plan:  Right hip pain-possible avascular necrosis versus end-stage osteoarthritis  Morbid obesity  History of low back pain  History of left heel pain which was not addressed today  Plan: Prescribed oxycodone and a Pap 2.5/325 #60 one by mouth every 6-8 hours when necessary pain but pharmacy calls saying they did not have that strength. We agreed to change it to 5/325 one half tablet Q6 to 8 hours when necessary pain. We are going to attempt to get an MRI of the right hip approved. I have given her a Medrol 4 mg 6 day dosepak as well. She may need to see orthopedist if we cannot get MRI approved.  25 minutes spent with patient today

## 2013-06-09 NOTE — Patient Instructions (Signed)
Take pain medication as directed. MRI will be ordered. Take Medrol Dosepak as prescribed.

## 2013-06-15 ENCOUNTER — Telehealth: Payer: Self-pay | Admitting: *Deleted

## 2013-06-15 NOTE — Telephone Encounter (Signed)
Peggy from [facility] phoned in authorization for MRI.  Authorization # [id number] valid 11/1-12/1/14.

## 2013-06-17 ENCOUNTER — Other Ambulatory Visit: Payer: No Typology Code available for payment source

## 2013-06-19 ENCOUNTER — Telehealth: Payer: Self-pay | Admitting: Internal Medicine

## 2013-06-19 DIAGNOSIS — M79672 Pain in left foot: Secondary | ICD-10-CM

## 2013-06-19 NOTE — Telephone Encounter (Signed)
Complains of left heel pain for a year. Last year had stress fracture right heel she says. Has been seeing Dr. Darrick Penna. Wants x-ray of left heel. Having difficulty walking. Will order left foot x-ray.

## 2013-06-21 ENCOUNTER — Ambulatory Visit
Admission: RE | Admit: 2013-06-21 | Discharge: 2013-06-21 | Disposition: A | Discharge status: Home / Self Care | Payer: No Typology Code available for payment source | Source: Ambulatory Visit | Attending: Internal Medicine | Admitting: Internal Medicine

## 2013-06-21 ENCOUNTER — Telehealth: Payer: Self-pay | Admitting: Internal Medicine

## 2013-06-21 DIAGNOSIS — M79672 Pain in left foot: Secondary | ICD-10-CM

## 2013-06-21 NOTE — Telephone Encounter (Signed)
Left foot x-ray shows calcaneal spurs, osteoarthritis, old healing trauma fourth metatarsal. Patient complaining of problems walking. Will suggest orthopedic consult. Needs bone density study.

## 2013-06-22 ENCOUNTER — Ambulatory Visit
Admission: RE | Admit: 2013-06-22 | Discharge: 2013-06-22 | Disposition: A | Discharge status: Home / Self Care | Payer: No Typology Code available for payment source | Source: Ambulatory Visit | Attending: Internal Medicine | Admitting: Internal Medicine

## 2013-06-27 ENCOUNTER — Ambulatory Visit (INDEPENDENT_AMBULATORY_CARE_PROVIDER_SITE_OTHER): Payer: No Typology Code available for payment source | Admitting: Internal Medicine

## 2013-06-27 ENCOUNTER — Encounter: Payer: Self-pay | Admitting: Internal Medicine

## 2013-06-27 VITALS — BP 118/78 | HR 64 | Temp 98.5°F | Ht 68.0 in | Wt 255.0 lb

## 2013-06-27 DIAGNOSIS — M19079 Primary osteoarthritis, unspecified ankle and foot: Secondary | ICD-10-CM

## 2013-06-27 DIAGNOSIS — M76899 Other specified enthesopathies of unspecified lower limb, excluding foot: Secondary | ICD-10-CM

## 2013-06-27 DIAGNOSIS — M24859 Other specific joint derangements of unspecified hip, not elsewhere classified: Secondary | ICD-10-CM

## 2013-06-27 DIAGNOSIS — M7062 Trochanteric bursitis, left hip: Secondary | ICD-10-CM

## 2013-06-27 DIAGNOSIS — M722 Plantar fascial fibromatosis: Secondary | ICD-10-CM

## 2013-06-27 DIAGNOSIS — M1611 Unilateral primary osteoarthritis, right hip: Secondary | ICD-10-CM

## 2013-06-27 DIAGNOSIS — M169 Osteoarthritis of hip, unspecified: Secondary | ICD-10-CM

## 2013-06-27 DIAGNOSIS — M2480 Other specific joint derangements of unspecified joint, not elsewhere classified: Secondary | ICD-10-CM

## 2013-06-27 DIAGNOSIS — M24852 Other specific joint derangements of left hip, not elsewhere classified: Secondary | ICD-10-CM

## 2013-06-27 DIAGNOSIS — M19072 Primary osteoarthritis, left ankle and foot: Secondary | ICD-10-CM

## 2013-06-27 DIAGNOSIS — M7061 Trochanteric bursitis, right hip: Secondary | ICD-10-CM

## 2013-06-27 DIAGNOSIS — M1610 Unilateral primary osteoarthritis, unspecified hip: Secondary | ICD-10-CM

## 2013-07-02 NOTE — Patient Instructions (Addendum)
You'll be referred to orthopedist for further evaluation. Flu vaccine declined.

## 2013-07-02 NOTE — Progress Notes (Signed)
Subjective:    Patient ID: Stephanie Andrade, female    DOB: 1956/01/03, 57 y.o.   MRN: [id number]  HPI In today to followup on hip and foot pain. Had MRI of her right hip. This showed bilateral greater trochanteric bursitis more pronounced on the left than the right. She had cystic degenerative changes. She had posterior lateral paralabral cyst at the right hip joint creating edema in the muscle. She had a partial tear of the distal right gluteus medius tendon. With regard to her left foot pain, she had several calcaneal spurs and osteoarthritis of the left foot. There is an old healing trauma of left fourth metatarsal. It seems  the hip is the more serious problem at the present time.   Review of Systems     Objective:   Physical Exam Not examined today. Spent 20 minutes speaking with patient about results of x-rays. She declines influenza immunization.       Assessment & Plan:  Osteoarthritis of right hip  Partial tear of distal right gluteus medius tendon  Bilateral trochanteric bursitis  Paralabral cyst  Calcaneal spurs  Healing trauma left fourth metatarsal  Osteoarthritis left foot  Plan: Patient will be referred to orthopedist for evaluation of hip issue and also foot issue.

## 2013-07-03 ENCOUNTER — Ambulatory Visit
Admission: RE | Admit: 2013-07-03 | Discharge: 2013-07-03 | Disposition: A | Discharge status: Home / Self Care | Payer: No Typology Code available for payment source | Source: Ambulatory Visit

## 2013-07-03 ENCOUNTER — Ambulatory Visit
Admission: RE | Admit: 2013-07-03 | Discharge: 2013-07-03 | Disposition: A | Discharge status: Home / Self Care | Payer: No Typology Code available for payment source | Source: Ambulatory Visit | Attending: Gynecology | Admitting: Gynecology

## 2013-07-03 ENCOUNTER — Other Ambulatory Visit: Payer: Self-pay | Admitting: Internal Medicine

## 2013-07-03 DIAGNOSIS — Z1231 Encounter for screening mammogram for malignant neoplasm of breast: Secondary | ICD-10-CM

## 2013-07-03 DIAGNOSIS — E2839 Other primary ovarian failure: Secondary | ICD-10-CM

## 2013-08-21 ENCOUNTER — Other Ambulatory Visit: Payer: Self-pay | Admitting: Internal Medicine

## 2013-10-20 ENCOUNTER — Other Ambulatory Visit: Payer: Self-pay

## 2013-10-20 MED ORDER — ZOLPIDEM TARTRATE 5 MG PO TABS: 5.0000 mg | ORAL_TABLET | Freq: Every evening | ORAL | Status: DC | PRN

## 2013-11-25 ENCOUNTER — Other Ambulatory Visit: Payer: Self-pay | Admitting: Internal Medicine

## 2014-01-19 ENCOUNTER — Telehealth: Payer: Self-pay | Admitting: Internal Medicine

## 2014-01-19 MED ORDER — OXYCODONE-ACETAMINOPHEN 5-325 MG PO TABS: 1.0000 | ORAL_TABLET | Freq: Three times a day (TID) | ORAL | Status: DC | PRN

## 2014-01-19 NOTE — Telephone Encounter (Signed)
She will need to pick up RX. Will print out.

## 2014-01-19 NOTE — Telephone Encounter (Signed)
Refill Oxycodone/ APAP 5/325 #60 with no refill one po q 8 hours prn pain

## 2014-07-16 ENCOUNTER — Ambulatory Visit (INDEPENDENT_AMBULATORY_CARE_PROVIDER_SITE_OTHER): Payer: BC Managed Care – PPO | Admitting: Internal Medicine

## 2014-07-16 ENCOUNTER — Encounter: Payer: Self-pay | Admitting: Internal Medicine

## 2014-07-16 VITALS — BP 120/78 | HR 79 | Temp 98.6°F | Ht 68.0 in

## 2014-07-16 DIAGNOSIS — H109 Unspecified conjunctivitis: Secondary | ICD-10-CM

## 2014-07-16 DIAGNOSIS — L03211 Cellulitis of face: Secondary | ICD-10-CM

## 2014-07-16 DIAGNOSIS — L0201 Cutaneous abscess of face: Secondary | ICD-10-CM

## 2014-07-16 MED ORDER — OFLOXACIN 0.3 % OP SOLN: 2.0000 [drp] | Freq: Four times a day (QID) | OPHTHALMIC | Status: DC

## 2014-07-16 MED ORDER — MUPIROCIN 2 % EX OINT: 1.0000 "application " | TOPICAL_OINTMENT | Freq: Two times a day (BID) | CUTANEOUS | Status: DC

## 2014-07-16 MED ORDER — LEVOFLOXACIN 500 MG PO TABS: 500.0000 mg | ORAL_TABLET | Freq: Every day | ORAL | Status: DC

## 2014-07-16 NOTE — Patient Instructions (Signed)
Take Levaquin daily for 10 days. Use Ocuflox eyedrops in both eyes for conjunctivitis. Apply Bactroban  to right nostril twice daily.

## 2014-07-16 NOTE — Progress Notes (Signed)
Subjective:    Patient ID: LIV FOOS, female    DOB: 1955-08-31, 58 y.o.   MRN: [id number]  HPI  A  couple weeks ago she noticed some irritation or abrasion in her right nostril. She used cortisone cream but it did not completely clear up. Now the tip of her nose is red and she's beginning to see some redness adjacent to her right nostril. She is also having drainage from her right eye. No fever.    Review of Systems     Objective:   Physical Exam  Conjunctivitis right eye. Right nostril is swollen and erythematous. There is redness at the tip of her nose and adjacent to right nostril.       Assessment & Plan:  Conjunctivitis right eye  Cellulitis nose and paranasal area  Plan: Levaquin 500 milligrams daily for 10 days. Bactroban ointment to apply in right nostril twice daily. Ocuflox ophthalmic solution 2 drops in each eye 4 times a day for 5 days

## 2014-09-06 ENCOUNTER — Other Ambulatory Visit: Payer: Self-pay | Admitting: Internal Medicine

## 2014-09-24 ENCOUNTER — Other Ambulatory Visit: Payer: Self-pay | Admitting: Internal Medicine

## 2014-09-24 NOTE — Telephone Encounter (Signed)
No labs since 2013. Needs CPE. Please schedule and refill until then

## 2014-09-24 NOTE — Telephone Encounter (Signed)
Sent in refill on losartan for 30 day supply patient to get CPE before any more refills

## 2014-10-02 ENCOUNTER — Other Ambulatory Visit: Payer: Self-pay | Admitting: Internal Medicine

## 2014-10-02 ENCOUNTER — Other Ambulatory Visit: Payer: BLUE CROSS/BLUE SHIELD | Admitting: Internal Medicine

## 2014-10-02 DIAGNOSIS — Z0000 Encounter for general adult medical examination without abnormal findings: Secondary | ICD-10-CM

## 2014-10-02 DIAGNOSIS — Z130 Encounter for screening for diseases of the blood and blood-forming organs and certain disorders involving the immune mechanism: Secondary | ICD-10-CM

## 2014-10-02 DIAGNOSIS — Z13220 Encounter for screening for lipoid disorders: Secondary | ICD-10-CM

## 2014-10-02 DIAGNOSIS — Z1321 Encounter for screening for nutritional disorder: Secondary | ICD-10-CM

## 2014-10-02 DIAGNOSIS — Z1329 Encounter for screening for other suspected endocrine disorder: Secondary | ICD-10-CM

## 2014-10-02 LAB — LIPID PANEL
CHOL/HDL RATIO: 2.6 ratio
CHOLESTEROL: 156 mg/dL (ref 0–200)
HDL: 60 mg/dL (ref >39)
LDL Cholesterol: 74 mg/dL (ref 0–99)
Triglycerides: 110 mg/dL (ref <150)
VLDL: 22 mg/dL (ref 0–40)

## 2014-10-02 LAB — COMPREHENSIVE METABOLIC PANEL
ALK PHOS: 73 U/L (ref 39–117)
ALT: 14 U/L (ref 0–35)
AST: 13 U/L (ref 0–37)
Albumin: 3.8 g/dL (ref 3.5–5.2)
BUN: 20 mg/dL (ref 6–23)
CO2: 30 meq/L (ref 19–32)
Calcium: 8.9 mg/dL (ref 8.4–10.5)
Chloride: 102 mEq/L (ref 96–112)
Creat: 0.83 mg/dL (ref 0.50–1.10)
GLUCOSE: 86 mg/dL (ref 70–99)
POTASSIUM: 4.8 meq/L (ref 3.5–5.3)
SODIUM: 139 meq/L (ref 135–145)
Total Bilirubin: 0.5 mg/dL (ref 0.2–1.2)
Total Protein: 6.6 g/dL (ref 6.0–8.3)

## 2014-10-02 LAB — CBC WITH DIFFERENTIAL/PLATELET
BASOS ABS: 0 10*3/uL (ref 0.0–0.1)
Basophils Relative: 0 % (ref 0–1)
Eosinophils Absolute: 0.3 10*3/uL (ref 0.0–0.7)
Eosinophils Relative: 4 % (ref 0–5)
HEMATOCRIT: 37.7 % (ref 36.0–46.0)
HEMOGLOBIN: 12.2 g/dL (ref 12.0–15.0)
LYMPHS ABS: 2.1 10*3/uL (ref 0.7–4.0)
Lymphocytes Relative: 34 % (ref 12–46)
MCH: 26.3 pg (ref 26.0–34.0)
MCHC: 32.4 g/dL (ref 30.0–36.0)
MCV: 81.3 fL (ref 78.0–100.0)
MONOS PCT: 5 % (ref 3–12)
MPV: 9.9 fL (ref 8.6–12.4)
Monocytes Absolute: 0.3 10*3/uL (ref 0.1–1.0)
Neutro Abs: 3.6 10*3/uL (ref 1.7–7.7)
Neutrophils Relative %: 57 % (ref 43–77)
Platelets: 214 10*3/uL (ref 150–400)
RBC: 4.64 MIL/uL (ref 3.87–5.11)
RDW: 14 % (ref 11.5–15.5)
WBC: 6.3 10*3/uL (ref 4.0–10.5)

## 2014-10-02 LAB — TSH: TSH: 0.974 u[IU]/mL (ref 0.350–4.500)

## 2014-10-03 LAB — VITAMIN D 25 HYDROXY (VIT D DEFICIENCY, FRACTURES): Vit D, 25-Hydroxy: 51 ng/mL (ref 30–100)

## 2014-10-04 ENCOUNTER — Ambulatory Visit (INDEPENDENT_AMBULATORY_CARE_PROVIDER_SITE_OTHER): Payer: BLUE CROSS/BLUE SHIELD | Admitting: Internal Medicine

## 2014-10-04 ENCOUNTER — Encounter: Payer: Self-pay | Admitting: Internal Medicine

## 2014-10-04 VITALS — BP 118/74 | HR 75 | Temp 98.6°F | Wt 265.0 lb

## 2014-10-04 DIAGNOSIS — Z8739 Personal history of other diseases of the musculoskeletal system and connective tissue: Secondary | ICD-10-CM

## 2014-10-04 DIAGNOSIS — Z0000 Encounter for general adult medical examination without abnormal findings: Secondary | ICD-10-CM | POA: Diagnosis not present

## 2014-10-04 DIAGNOSIS — R7302 Impaired glucose tolerance (oral): Secondary | ICD-10-CM

## 2014-10-04 DIAGNOSIS — D259 Leiomyoma of uterus, unspecified: Secondary | ICD-10-CM | POA: Diagnosis not present

## 2014-10-04 DIAGNOSIS — Z87898 Personal history of other specified conditions: Secondary | ICD-10-CM | POA: Diagnosis not present

## 2014-10-04 DIAGNOSIS — Z8659 Personal history of other mental and behavioral disorders: Secondary | ICD-10-CM | POA: Diagnosis not present

## 2014-10-04 DIAGNOSIS — Z5181 Encounter for therapeutic drug level monitoring: Secondary | ICD-10-CM | POA: Diagnosis not present

## 2014-10-04 DIAGNOSIS — E669 Obesity, unspecified: Secondary | ICD-10-CM | POA: Diagnosis not present

## 2014-10-04 DIAGNOSIS — Z79890 Hormone replacement therapy (postmenopausal): Secondary | ICD-10-CM

## 2014-10-04 DIAGNOSIS — Z8639 Personal history of other endocrine, nutritional and metabolic disease: Secondary | ICD-10-CM

## 2014-10-04 DIAGNOSIS — Z8709 Personal history of other diseases of the respiratory system: Secondary | ICD-10-CM

## 2014-10-04 DIAGNOSIS — Z862 Personal history of diseases of the blood and blood-forming organs and certain disorders involving the immune mechanism: Secondary | ICD-10-CM | POA: Diagnosis not present

## 2014-10-04 DIAGNOSIS — I10 Essential (primary) hypertension: Secondary | ICD-10-CM | POA: Diagnosis not present

## 2014-10-04 LAB — POCT URINALYSIS DIPSTICK
BILIRUBIN UA: NEGATIVE
Blood, UA: NEGATIVE
Glucose, UA: NEGATIVE
KETONES UA: NEGATIVE
Leukocytes, UA: NEGATIVE
Nitrite, UA: NEGATIVE
Protein, UA: NEGATIVE
Spec Grav, UA: 1.015
Urobilinogen, UA: NEGATIVE
pH, UA: 6

## 2014-10-05 LAB — HEMOGLOBIN A1C
HEMOGLOBIN A1C: 5.8 % — AB (ref <5.7)
MEAN PLASMA GLUCOSE: 120 mg/dL — AB (ref <117)

## 2014-10-16 NOTE — Progress Notes (Signed)
Subjective:    Patient ID: Stephanie Andrade, female    DOB: 12-11-1955, 59 y.o.   MRN: [id number]  HPI  59 year old White Female in today for health maintenance and evaluation of medical problems. She has a history of hypertension, uterine fibroids, family history of ovarian cancer, history of sarcoidosis diagnosed in 1995 currently asymptomatic, history of asthma, history of vitamin D deficiency.  Past medical history: Left heel fracture 2002. Bilateral carpal tunnel syndrome diagnosed 1997. Has been on Benicar HCTZ since 2008 for hypertension.  GYN has her on estrogen replacement.  Had Tap vaccine February 2011.  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 Codeine causes vomiting, cannot tolerate hydrocodone, Tessalon Perles or dextromethorphan.  Declined influenza immunization.  Mammography ordered.  History of recurrent back pain.  Family history: Brother died with history of hypertension and alcoholism. Father died of complications of dementia. Mother died at age 22 of ovarian cancer. One sister in good health. Older brother living with hypertension and hyperlipidemia. Father had hyperlipidemia.  Social history: She does not smoke. Social alcohol consumption. Has a four-year college degree. 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 Married. No children.  Colonoscopy done by Dr. Matthias Hughs in 2009  Review of Systems  Constitutional: Positive for fatigue.  Respiratory:       History of asthma  Musculoskeletal:       History of recurrent back pain  Hematological: Negative.   Psychiatric/Behavioral:       History of depression       Objective:   Physical Exam  Constitutional: She is oriented to person, place, and time. She appears well-developed and well-nourished. No distress.  HENT:  Head: Normocephalic and atraumatic.  Right Ear: External ear normal.  Left Ear: External ear normal.  Mouth/Throat: Oropharynx is clear and moist. No oropharyngeal exudate.    Eyes: Conjunctivae and EOM are normal. Pupils are equal, round, and reactive to light. Right eye exhibits no discharge. Left eye exhibits no discharge. No scleral icterus.  Neck: Neck supple. No JVD present. No thyromegaly present.  Cardiovascular: Normal rate, regular rhythm, normal heart sounds and intact distal pulses.   No murmur heard. Pulmonary/Chest: Effort normal and breath sounds normal. No respiratory distress. She has no wheezes. She has no rales. She exhibits no tenderness.  Breasts normal female  Abdominal: Soft. Bowel sounds are normal. She exhibits no distension and no mass. There is no tenderness. There is no rebound and no guarding.  Genitourinary:  Deferred to GYN. Is to see Stephanie Andrade in 2 months  Musculoskeletal: Normal range of motion. She exhibits no edema.  Lymphadenopathy:    She has no cervical adenopathy.  Neurological: She is alert and oriented to person, place, and time. She has normal reflexes. No cranial nerve deficit. Coordination normal.  Skin: Skin is warm and dry. No rash noted. She is not diaphoretic.  Psychiatric: She has a normal mood and affect. Her behavior is normal. Judgment and thought content normal.  Vitals reviewed.         Assessment & Plan:  Essential hypertension  Estrogen replacement  History of recurrent low back pain  History of asthma  History of vitamin D deficiency  History of sarcoidosis diagnosed in 1995-currently not an issue  History of bilateral carpal tunnel syndrome 1997  History depression  Impaired glucose tolerance. Hemoglobin A1c 5.8%. Recommend diet exercise. Recheck in 6 months.  Plan: Return in 6 months or as needed. Continue same medications. Encouraged diet and exercise.

## 2014-10-29 ENCOUNTER — Other Ambulatory Visit: Payer: Self-pay | Admitting: *Deleted

## 2014-10-29 ENCOUNTER — Other Ambulatory Visit: Payer: Self-pay | Admitting: Internal Medicine

## 2014-10-29 MED ORDER — LOSARTAN POTASSIUM 50 MG PO TABS: 50.0000 mg | ORAL_TABLET | Freq: Every day | ORAL | Status: DC

## 2014-10-29 NOTE — Telephone Encounter (Signed)
Losartan refills sent to pharmacy

## 2014-12-06 ENCOUNTER — Other Ambulatory Visit: Payer: Self-pay | Admitting: Internal Medicine

## 2014-12-09 ENCOUNTER — Encounter: Payer: Self-pay | Admitting: Internal Medicine

## 2014-12-09 DIAGNOSIS — R7302 Impaired glucose tolerance (oral): Secondary | ICD-10-CM | POA: Insufficient documentation

## 2014-12-09 NOTE — Patient Instructions (Signed)
Encouraged diet exercise and weight loss. Return in 6 months. Continue same medications. Screen for diabetes with hemoglobin A1c.

## 2014-12-17 ENCOUNTER — Other Ambulatory Visit: Payer: Self-pay | Admitting: *Deleted

## 2014-12-17 MED ORDER — OXYCODONE-ACETAMINOPHEN 5-325 MG PO TABS: 1.0000 | ORAL_TABLET | Freq: Three times a day (TID) | ORAL | Status: DC | PRN

## 2014-12-17 NOTE — Telephone Encounter (Signed)
Pain medication refilled

## 2014-12-22 ENCOUNTER — Other Ambulatory Visit: Payer: Self-pay | Admitting: Internal Medicine

## 2015-01-29 ENCOUNTER — Encounter: Payer: Self-pay | Admitting: Internal Medicine

## 2015-01-29 ENCOUNTER — Ambulatory Visit (INDEPENDENT_AMBULATORY_CARE_PROVIDER_SITE_OTHER): Payer: BLUE CROSS/BLUE SHIELD | Admitting: Internal Medicine

## 2015-01-29 VITALS — BP 136/72 | HR 74 | Temp 97.9°F | Wt 275.0 lb

## 2015-01-29 DIAGNOSIS — M7061 Trochanteric bursitis, right hip: Secondary | ICD-10-CM | POA: Diagnosis not present

## 2015-01-29 DIAGNOSIS — M1611 Unilateral primary osteoarthritis, right hip: Secondary | ICD-10-CM

## 2015-01-29 MED ORDER — PREDNISONE 10 MG PO TABS: ORAL_TABLET | ORAL | Status: DC

## 2015-01-29 NOTE — Progress Notes (Signed)
Subjective:    Patient ID: Stephanie Andrade, female    DOB: [birth year], 59 y.o.   MRN: [id number]  HPI She has been directing weddings on a monthly basis. Had a long day this past Friday and again on Saturday. Was on her feet Saturday for well over 12 hours. Began to have pain in her right buttock radiating down her right leg and also pain in her right lateral leg near her trochanter. She has a history of osteoarthritis of the right hip. Last hip x-ray was in 2014 showing posterior lateral paralabral cyst formation. Currently having to take narcotic pain medication. Also has Flexeril on hand. Says she generally has a good response to the course of steroid-induced.    Review of Systems     Objective:   Physical Exam  Straight leg raising is negative at 90 on the right. Muscle strength is 5 over 5 in the right lower extremity. She is tender over her right trochanter.      Assessment & Plan:  Osteoarthritis of right hip  Right trochanteric bursitis  Plan: Sterapred DS 10 mg 12 day Dosepak. Take Flexeril at night. Continue narcotic pain medication sparingly. Will eventually need hip replacement.

## 2015-01-29 NOTE — Patient Instructions (Signed)
Takes to says directed in a 12 day tapering course. Take narcotic pain medication sparingly. Take Flexeril at night.

## 2015-02-19 ENCOUNTER — Ambulatory Visit (INDEPENDENT_AMBULATORY_CARE_PROVIDER_SITE_OTHER): Payer: BLUE CROSS/BLUE SHIELD | Admitting: Sports Medicine

## 2015-02-19 ENCOUNTER — Encounter: Payer: Self-pay | Admitting: Sports Medicine

## 2015-02-19 VITALS — BP 147/70 | HR 87 | Ht 68.0 in | Wt 279.0 lb

## 2015-02-19 DIAGNOSIS — M1611 Unilateral primary osteoarthritis, right hip: Secondary | ICD-10-CM

## 2015-02-19 DIAGNOSIS — M5441 Lumbago with sciatica, right side: Secondary | ICD-10-CM | POA: Diagnosis not present

## 2015-02-19 NOTE — Assessment & Plan Note (Signed)
Merits replacement at some point  Will ask her to consult with Dr Mayer Camel

## 2015-02-19 NOTE — Patient Instructions (Signed)
Gabapentin: if not improving, increase dose according to the next set of 5 days (each line). If getting relief you do not need to increase the dose according to the schedule Days 1-5: 300mg  at night.  Days 6-10: 600mg  at night Days 11-15: 300mg  during day, 600mg  at night Days 16-20: 300mg  in morning, 300mg  midday, 600mg  at night.  Follow up in 1 month  Okay to take robaxin and oxycodone as needed

## 2015-02-19 NOTE — Progress Notes (Signed)
Stephanie Andrade - 59 y.o. female MRN [id number]  Date of birth: [birth year]  SUBJECTIVE:  Including CC & ROS.   Stephanie Andrade is a 59 y.o. female presenting with right hip pain. This is a chronic issue for her that has gotten worse over past several months. She has known right hip OA and a history of trochanteric bursitis. She says she has been working 16-18 hour days on the weekends without much break and attributes a lot of her issues from this. Pain is located in the lower back on the right side/buttocks and will travel through to the front of the hip, also having separate pain in the right trochanteric region that travels down somewhat. She also gets a pain down into the right foot, but doesn't think the pain is continuous from the back/hip to the foot and has "skipped" areas. She has been taking 1/4 tablet of oxycodone-Apap 5mg  1 to 2 times a day, and 1/2 half tablet at night to help with the pain. She also just finished a 10 day course of steroids that helped for the first few days but no relief after that. In the past she had gotten good relief from acupuncture but the most recent treatments did not help. She is not adverse to getting hip surgery but would like to hold off as much as she can.   Similar bout of sciatica evaluated in [facility] ~ 5 yrs ago by Dr Christella Hartigan and response to robaxin   HISTORY: Past Medical, Surgical, Social, and Family History Reviewed & Updated per EMR. Pertinent Historical Findings include: Right greater trochanter bursitis Chronic right hip pain secondary to OA  DATA REVIEWED: 06/2013 MRI right hip - moderate OA with posterior lateral paralabral cyst  PHYSICAL EXAM:  VS: BP:(!) 147/70 mmHg  HR:87bpm  TEMP: ( )  RESP:   HT:5\' 8"  (172.7 cm)   WT:279 lb (126.554 kg)  BMI:42.5 PHYSICAL EXAM: General: NAD Neuro: alert and oriented x 3 MSK:  Back: INSPECTION: no deformities PALPATION: TTP right paraspinal, right buttocks RANGE OF MOTION: limited leftward  rotation due to pain, good ROM forward flexion, extension, lateral flexion SPECIAL TESTS: SLR testing negative bilaterally Right Hip:  INSPECTION:  PALPATION: TTP right greater trochanter RANGE OF MOTION: limited internal and external rotation STRENGTH: 5/5 flexion and extension NEUROVASCULAR STATUS: 2+ DP pulses  ASSESSMENT & PLAN: See problem based charting & AVS for pt instructions.

## 2015-02-19 NOTE — Assessment & Plan Note (Signed)
Multiple issues including history of spinal stenosis with neurogenic claudication, right hip OA (which likely needs replacement). Current exacerbation most likely with some nerve root irritation with radiation of pain down the right leg. She has been getting some relief with small doses of oxycodone (1/4 of 5mg  tablet). Went over some easy back stretching exercises to work on. Will add gabapentin in stepwise fashion (see AVS instructions). Okay to continue oxycodone and robaxin. Follow up in 1 month.

## 2015-02-21 ENCOUNTER — Other Ambulatory Visit: Payer: Self-pay | Admitting: *Deleted

## 2015-02-21 MED ORDER — GABAPENTIN 300 MG PO CAPS: 300.0000 mg | ORAL_CAPSULE | Freq: Every day | ORAL | Status: DC

## 2015-02-21 MED ORDER — METHOCARBAMOL 500 MG PO TABS: 500.0000 mg | ORAL_TABLET | Freq: Two times a day (BID) | ORAL | Status: DC | PRN

## 2015-02-28 ENCOUNTER — Telehealth: Payer: Self-pay | Admitting: Internal Medicine

## 2015-02-28 MED ORDER — OXYCODONE-ACETAMINOPHEN 5-325 MG PO TABS: 1.0000 | ORAL_TABLET | Freq: Three times a day (TID) | ORAL | Status: DC | PRN

## 2015-02-28 NOTE — Telephone Encounter (Signed)
Patient would like her Oxicodone APAP 5 325mg  refilled.

## 2015-02-28 NOTE — Telephone Encounter (Signed)
Only willing to do this once. If continues on it has to go to chronic pain med clinic

## 2015-02-28 NOTE — Telephone Encounter (Signed)
Patient will need to be referred to pain management for anymore refills on pain medications

## 2015-03-21 ENCOUNTER — Encounter (HOSPITAL_COMMUNITY): Payer: Self-pay | Admitting: *Deleted

## 2015-03-21 ENCOUNTER — Emergency Department (HOSPITAL_COMMUNITY)
Admission: EM | Admit: 2015-03-21 | Discharge: 2015-03-21 | Disposition: A | Discharge status: Home / Self Care | Payer: BLUE CROSS/BLUE SHIELD | Attending: Emergency Medicine | Admitting: Emergency Medicine

## 2015-03-21 ENCOUNTER — Emergency Department (HOSPITAL_COMMUNITY): Payer: BLUE CROSS/BLUE SHIELD

## 2015-03-21 ENCOUNTER — Telehealth: Payer: Self-pay | Admitting: Internal Medicine

## 2015-03-21 DIAGNOSIS — Z862 Personal history of diseases of the blood and blood-forming organs and certain disorders involving the immune mechanism: Secondary | ICD-10-CM | POA: Diagnosis not present

## 2015-03-21 DIAGNOSIS — Z8639 Personal history of other endocrine, nutritional and metabolic disease: Secondary | ICD-10-CM | POA: Insufficient documentation

## 2015-03-21 DIAGNOSIS — R51 Headache: Secondary | ICD-10-CM | POA: Diagnosis not present

## 2015-03-21 DIAGNOSIS — Z79899 Other long term (current) drug therapy: Secondary | ICD-10-CM | POA: Diagnosis not present

## 2015-03-21 DIAGNOSIS — R197 Diarrhea, unspecified: Secondary | ICD-10-CM | POA: Insufficient documentation

## 2015-03-21 DIAGNOSIS — R630 Anorexia: Secondary | ICD-10-CM | POA: Insufficient documentation

## 2015-03-21 DIAGNOSIS — Z87891 Personal history of nicotine dependence: Secondary | ICD-10-CM | POA: Diagnosis not present

## 2015-03-21 DIAGNOSIS — Z7952 Long term (current) use of systemic steroids: Secondary | ICD-10-CM | POA: Diagnosis not present

## 2015-03-21 DIAGNOSIS — Z86018 Personal history of other benign neoplasm: Secondary | ICD-10-CM | POA: Diagnosis not present

## 2015-03-21 DIAGNOSIS — R2243 Localized swelling, mass and lump, lower limb, bilateral: Secondary | ICD-10-CM | POA: Insufficient documentation

## 2015-03-21 DIAGNOSIS — M25552 Pain in left hip: Secondary | ICD-10-CM | POA: Insufficient documentation

## 2015-03-21 DIAGNOSIS — I10 Essential (primary) hypertension: Secondary | ICD-10-CM | POA: Diagnosis not present

## 2015-03-21 DIAGNOSIS — R112 Nausea with vomiting, unspecified: Secondary | ICD-10-CM

## 2015-03-21 DIAGNOSIS — Z792 Long term (current) use of antibiotics: Secondary | ICD-10-CM | POA: Insufficient documentation

## 2015-03-21 DIAGNOSIS — M25551 Pain in right hip: Secondary | ICD-10-CM | POA: Insufficient documentation

## 2015-03-21 DIAGNOSIS — Z872 Personal history of diseases of the skin and subcutaneous tissue: Secondary | ICD-10-CM | POA: Insufficient documentation

## 2015-03-21 DIAGNOSIS — R1013 Epigastric pain: Secondary | ICD-10-CM | POA: Insufficient documentation

## 2015-03-21 DIAGNOSIS — J45909 Unspecified asthma, uncomplicated: Secondary | ICD-10-CM | POA: Diagnosis not present

## 2015-03-21 LAB — COMPREHENSIVE METABOLIC PANEL
ALK PHOS: 68 U/L (ref 38–126)
ALT: 25 U/L (ref 14–54)
ANION GAP: 10 (ref 5–15)
AST: 23 U/L (ref 15–41)
Albumin: 4 g/dL (ref 3.5–5.0)
BUN: 12 mg/dL (ref 6–20)
CALCIUM: 9.2 mg/dL (ref 8.9–10.3)
CHLORIDE: 104 mmol/L (ref 101–111)
CO2: 24 mmol/L (ref 22–32)
CREATININE: 0.77 mg/dL (ref 0.44–1.00)
GFR calc non Af Amer: >60 mL/min (ref >60)
GLUCOSE: 107 mg/dL — AB (ref 65–99)
Potassium: 3.7 mmol/L (ref 3.5–5.1)
SODIUM: 138 mmol/L (ref 135–145)
TOTAL PROTEIN: 6.9 g/dL (ref 6.5–8.1)
Total Bilirubin: 0.5 mg/dL (ref 0.3–1.2)

## 2015-03-21 LAB — URINE MICROSCOPIC-ADD ON

## 2015-03-21 LAB — CBC
HEMATOCRIT: 37.2 % (ref 36.0–46.0)
Hemoglobin: 12.7 g/dL (ref 12.0–15.0)
MCH: 26.9 pg (ref 26.0–34.0)
MCHC: 34.1 g/dL (ref 30.0–36.0)
MCV: 78.8 fL (ref 78.0–100.0)
Platelets: 240 10*3/uL (ref 150–400)
RBC: 4.72 MIL/uL (ref 3.87–5.11)
RDW: 13.4 % (ref 11.5–15.5)
WBC: 8.7 10*3/uL (ref 4.0–10.5)

## 2015-03-21 LAB — URINALYSIS, ROUTINE W REFLEX MICROSCOPIC
Bilirubin Urine: NEGATIVE
Glucose, UA: NEGATIVE mg/dL
KETONES UR: NEGATIVE mg/dL
LEUKOCYTES UA: NEGATIVE
Nitrite: NEGATIVE
Protein, ur: NEGATIVE mg/dL
Specific Gravity, Urine: 1.013 (ref 1.005–1.030)
UROBILINOGEN UA: 0.2 mg/dL (ref 0.0–1.0)
pH: 6 (ref 5.0–8.0)

## 2015-03-21 LAB — I-STAT TROPONIN, ED: Troponin i, poc: 0 ng/mL (ref 0.00–0.08)

## 2015-03-21 LAB — LIPASE, BLOOD: Lipase: 26 U/L (ref 22–51)

## 2015-03-21 MED ORDER — OMEPRAZOLE 20 MG PO CPDR: 20.0000 mg | DELAYED_RELEASE_CAPSULE | Freq: Two times a day (BID) | ORAL | Status: DC

## 2015-03-21 MED ORDER — GI COCKTAIL ~~LOC~~
30.0000 mL | Freq: Once | ORAL | Status: AC
  Administered 2015-03-21: 30 mL via ORAL
  Filled 2015-03-21: qty 30

## 2015-03-21 NOTE — Telephone Encounter (Signed)
Received call from close friend that pt has been receiving accupuncture and taking numerous supplements from accupuncturist.

## 2015-03-21 NOTE — Telephone Encounter (Signed)
Patient called with c/o indigestion, jaw pain, L sided HA, midsternal pain, nausea.  Not sure if she has fever (could be hot flash), has had nothing to eat today.  Symptoms started at 3:00 a.m.  Patient later states that she did have some dental work done about 10 days ago and jaw pain COULD be r/t to that.  However, all other symptoms are still present.  Patient then states that her HA is on the R side.    Advised patient to go to the ED to be evaluated.  Patient wanted to know if she can be evaluated at the Urgent Care.  Advised that they will send her to the ED.

## 2015-03-21 NOTE — ED Notes (Signed)
Pt sent here by her pcp for epigastric pain that she would like r/o cardiac pain.  Pt c/o epigastric pain, diarrhea, dry heaves from 3 am till 10.  Pt continues to have belching, nausea and bil jaw pain.

## 2015-03-21 NOTE — ED Notes (Signed)
Pt ambulating in room and dressing self independently.

## 2015-03-21 NOTE — Discharge Instructions (Signed)
Please follow up with your doctor for further evaluation of your pain.  Avoid taking ibuprofen as it may aggravate your pain.  Take prilosec 30 minutes before meal to help decrease acid build up.  Return to ER if your condition worsen or if you have other concerns.  Abdominal Pain, Women Abdominal (stomach, pelvic, or belly) pain can be caused by many things. It is important to tell your doctor:  The location of the pain.  Does it come and go or is it present all the time?  Are there things that start the pain (eating certain foods, exercise)?  Are there other symptoms associated with the pain (fever, nausea, vomiting, diarrhea)? All of this is helpful to know when trying to find the cause of the pain. CAUSES   Stomach: virus or bacteria infection, or ulcer.  Intestine: appendicitis (inflamed appendix), regional ileitis (Crohn's disease), ulcerative colitis (inflamed colon), irritable bowel syndrome, diverticulitis (inflamed diverticulum of the colon), or cancer of the stomach or intestine.  Gallbladder disease or stones in the gallbladder.  Kidney disease, kidney stones, or infection.  Pancreas infection or cancer.  Fibromyalgia (pain disorder).  Diseases of the female organs:  Uterus: fibroid (non-cancerous) tumors or infection.  Fallopian tubes: infection or tubal pregnancy.  Ovary: cysts or tumors.  Pelvic adhesions (scar tissue).  Endometriosis (uterus lining tissue growing in the pelvis and on the pelvic organs).  Pelvic congestion syndrome (female organs filling up with blood just before the menstrual period).  Pain with the menstrual period.  Pain with ovulation (producing an egg).  Pain with an IUD (intrauterine device, birth control) in the uterus.  Cancer of the female organs.  Functional pain (pain not caused by a disease, may improve without treatment).  Psychological pain.  Depression. DIAGNOSIS  Your doctor will decide the seriousness of your pain  by doing an examination.  Blood tests.  X-rays.  Ultrasound.  CT scan (computed tomography, special type of X-ray).  MRI (magnetic resonance imaging).  Cultures, for infection.  Barium enema (dye inserted in the large intestine, to better view it with X-rays).  Colonoscopy (looking in intestine with a lighted tube).  Laparoscopy (minor surgery, looking in abdomen with a lighted tube).  Major abdominal exploratory surgery (looking in abdomen with a large incision). TREATMENT  The treatment will depend on the cause of the pain.   Many cases can be observed and treated at home.  Over-the-counter medicines recommended by your caregiver.  Prescription medicine.  Antibiotics, for infection.  Birth control pills, for painful periods or for ovulation pain.  Hormone treatment, for endometriosis.  Nerve blocking injections.  Physical therapy.  Antidepressants.  Counseling with a psychologist or psychiatrist.  Minor or major surgery. HOME CARE INSTRUCTIONS   Do not take laxatives, unless directed by your caregiver.  Take over-the-counter pain medicine only if ordered by your caregiver. Do not take aspirin because it can cause an upset stomach or bleeding.  Try a clear liquid diet (broth or water) as ordered by your caregiver. Slowly move to a bland diet, as tolerated, if the pain is related to the stomach or intestine.  Have a thermometer and take your temperature several times a day, and record it.  Bed rest and sleep, if it helps the pain.  Avoid sexual intercourse, if it causes pain.  Avoid stressful situations.  Keep your follow-up appointments and tests, as your caregiver orders.  If the pain does not go away with medicine or surgery, you may try:  Acupuncture.  Relaxation exercises (yoga, meditation).  Group therapy.  Counseling. SEEK MEDICAL CARE IF:   You notice certain foods cause stomach pain.  Your home care treatment is not helping your  pain.  You need stronger pain medicine.  You want your IUD removed.  You feel faint or lightheaded.  You develop nausea and vomiting.  You develop a rash.  You are having side effects or an allergy to your medicine. SEEK IMMEDIATE MEDICAL CARE IF:   Your pain does not go away or gets worse.  You have a fever.  Your pain is felt only in portions of the abdomen. The right side could possibly be appendicitis. The left lower portion of the abdomen could be colitis or diverticulitis.  You are passing blood in your stools (bright red or black tarry stools, with or without vomiting).  You have blood in your urine.  You develop chills, with or without a fever.  You pass out. MAKE SURE YOU:   Understand these instructions.  Will watch your condition.  Will get help right away if you are not doing well or get worse. Document Released: 05/31/2007 Document Revised: 12/18/2013 Document Reviewed: 06/20/2009 [facility] Patient Information 2015 [place], [place]. This information is not intended to replace advice given to you by your health care provider. Make sure you discuss any questions you have with your health care provider.

## 2015-03-21 NOTE — ED Provider Notes (Signed)
CSN: [id number]     Arrival date & time 03/21/15  1628 History   First MD Initiated Contact with Patient 03/21/15 1837     Chief Complaint  Patient presents with  . Abdominal Pain     (Consider location/radiation/quality/duration/timing/severity/associated sxs/prior Treatment) HPI   59 year old female with history of asthma, hypertension, sent here at the recommendation of her primary care provider for evaluation of epigastric pain. Patient reports this morning  at 3 AM pt developed epigastric abdominal pain. Pain started shortly after she became nauseous and vomitted nonbloody nonbilious vomitus. States pain is sharp, and burning, lasting for seconds to minutes. Patient has been belching throughout the day. She endorses having some loose stools. She has been avoiding eating. She also endorsed a mild frontal headache with right-sided jaw pain. State jaw pain has been ongoing for the past week after she had a crown placed 2 weeks ago. States headache is related to her jaw pain. She also has bilateral hip pain which she takes ibuprofen earlier in the day. She is currently going through menopausal and unable to tell me if she has fever or chills. She denies any lightheadedness or dizziness. Denies diaphoresis, shortness of breath. No productive cough or hemoptysis. No back pain, dysuria, focal numbness or weakness. No significant family history of cardiac disease. She is a former smoker. No prior history of cardiac disease or MI. No prior cardiac provocative test. She is currently pain-free.  Past Medical History  Diagnosis Date  . Essential hypertension, benign   . Fibroids   . Family history of ovarian cancer   . Hidradenitis   . History of sarcoidosis   . Asthma   . Vitamin D deficiency    History reviewed. No pertinent past surgical history. Family History  Problem Relation Age of Onset  . Cancer Mother   . Arthritis Father   . Hypertension Father   . Arthritis Sister   . Hypertension  Brother    History  Substance Use Topics  . Smoking status: Former Smoker    Types: Cigarettes    Quit date: 06/20/2009  . Smokeless tobacco: Never Used  . Alcohol Use: Yes     Comment: socially   OB History    No data available     Review of Systems  All other systems reviewed and are negative.     Allergies  Codeine; Dextromethorphan; Mushroom extract complex; and Tessalon perles  Home Medications   Prior to Admission medications   Medication Sig Start Date End Date Taking? Authorizing Provider  buPROPion (WELLBUTRIN XL) 300 MG 24 hr tablet TAKE 1 TABLET IN THE MORNING. 12/24/14   Elby Showers, MD  diazepam (VALIUM) 5 MG tablet For muscle spasm 03/16/13   Stefanie Libel, MD  gabapentin (NEURONTIN) 300 MG capsule Take 1 capsule (300 mg total) by mouth at bedtime. See additional directions from office visit notes. 02/21/15   Stefanie Libel, MD  ibuprofen (ADVIL,MOTRIN) 200 MG tablet Take 200 mg by mouth every 6 (six) hours as needed.      Historical Provider, MD  losartan (COZAAR) 50 MG tablet TAKE 1 TABLET ONCE DAILY. 12/06/14   Elby Showers, MD  methocarbamol (ROBAXIN) 500 MG tablet Take 1 tablet (500 mg total) by mouth every 12 (twelve) hours as needed for muscle spasms. 02/21/15   Stefanie Libel, MD  montelukast (SINGULAIR) 10 MG tablet TAKE 1 TABLET EACH DAY. 09/06/14   Elby Showers, MD  multivitamin [facility]) per tablet Take 1 tablet by  mouth daily.      Historical Provider, MD  mupirocin ointment (BACTROBAN) 2 % Place 1 application into the nose 2 (two) times daily. 07/16/14   Elby Showers, MD  oxyCODONE-acetaminophen (ROXICET) 5-325 MG per tablet Take 1 tablet by mouth every 8 (eight) hours as needed for severe pain. 02/28/15   Elby Showers, MD  predniSONE (DELTASONE) 10 MG tablet 6 tabs PO x 2 days the 5 tabs x 2 days then 4 tabs x 2 days then 3 tabs x 2days then 2 tabs x 2 days then 1 tab x 2 days 01/29/15   Elby Showers, MD  valACYclovir (VALTREX) 500 MG tablet TAKE 1 TABLET  TWICE DAILY FOR 5 DAYS. 02/07/13   Elby Showers, MD  zolpidem (AMBIEN) 5 MG tablet Take 1 tablet (5 mg total) by mouth at bedtime as needed for sleep. 10/20/13   Elby Showers, MD   BP 127/82 mmHg  Pulse 98  Temp(Src) 98.1 F (36.7 C) (Oral)  Resp 18  Ht 5\' 8"  (1.727 m)  Wt 280 lb (127.007 kg)  BMI 42.58 kg/m2  SpO2 100%  LMP 04/06/2011 Physical Exam  Constitutional: She appears well-developed and well-nourished. No distress.  Moderately obese Caucasian female appears to be in no acute distress.  HENT:  Head: Atraumatic.  Mouth/Throat: Oropharynx is clear and moist.  Eyes: Conjunctivae are normal.  Neck: Neck supple.  No nuchal rigidity.  Cardiovascular: Normal rate, regular rhythm and intact distal pulses.   Pulmonary/Chest: Effort normal and breath sounds normal. No respiratory distress. She has no wheezes. She has no rales. She exhibits no tenderness.  Abdominal: Soft. Bowel sounds are normal. She exhibits no distension. There is tenderness (Tenderness to the epigastric region without guarding or rebound tenderness. Negative Murphy sign, no pain at McBurney's point.).  Musculoskeletal: She exhibits edema (Trace bilateral edema to lower extremities which is normal for patient. Intact pedal pulses bilaterally.).  Neurological: She is alert.  Skin: No rash noted.  Psychiatric: She has a normal mood and affect.  Nursing note and vitals reviewed.   ED Course  Procedures (including critical care time)  Patient here with reproducible epigastric abdominal pain. Her pain is atypical for cardiac disease. She is currently pain-free. GI cocktail given. Low suspicion for biliary disease such as cholecystitis this time. Her EKG and her labs are reassuring.  7:46 PM HEART score <3, low suspicion for MACE.  GI cocktail has greatly improves patient's pain. Since patient has been taking ibuprofen regularly for hip pain, I believe this may aggravate her stomach pain. Nausea vomiting diarrhea,  likely GI etiology. At this time patient is stable for discharge with close follow-up with PCP for further care. She may benefit outpatient cardiac workup if indicated. Return precautions discussed. I recommend avoid NSAIDs, and to take Prilosec in the meantime.  Labs Review Labs Reviewed  COMPREHENSIVE METABOLIC PANEL - Abnormal; Notable for the following:    Glucose, Bld 107 (*)    All other components within normal limits  URINALYSIS, ROUTINE W REFLEX MICROSCOPIC (NOT AT [facility]) - Abnormal; Notable for the following:    Hgb urine dipstick SMALL (*)    All other components within normal limits  LIPASE, BLOOD  CBC  URINE MICROSCOPIC-ADD ON  Randolm Idol, ED    Imaging Review Dg Chest 2 View  03/21/2015   CLINICAL DATA:  Epigastric pain  EXAM: CHEST  2 VIEW  COMPARISON:  03/19/2010  FINDINGS: Normal heart size and mediastinal contours. No  acute infiltrate or edema. No effusion or pneumothorax. No acute osseous findings.  IMPRESSION: No active cardiopulmonary disease.   Electronically Signed   By: Monte Fantasia M.D.   On: 03/21/2015 17:16     EKG Interpretation None      Date: 03/21/2015  Rate: 92  Rhythm: normal sinus rhythm  QRS Axis: normal  Intervals: normal  ST/T Wave abnormalities: normal  Conduction Disutrbances: none  Narrative Interpretation:   Old EKG Reviewed: No significant changes noted     MDM   Final diagnoses:  Epigastric abdominal pain  Nausea vomiting and diarrhea    BP 146/65 mmHg  Pulse 71  Temp(Src) 98.1 F (36.7 C) (Oral)  Resp 11  Ht 5\' 8"  (1.727 m)  Wt 280 lb (127.007 kg)  BMI 42.58 kg/m2  SpO2 91%  LMP 04/06/2011  I have reviewed nursing notes and vital signs. I personally viewed the imaging tests through PACS system and agrees with radiologist's intepretation I reviewed available ER/hospitalization records through the EMR     Domenic Moras, PA-C 03/21/15 [place], MD 03/22/15 [phone]

## 2015-03-22 ENCOUNTER — Telehealth: Payer: Self-pay | Admitting: *Deleted

## 2015-03-22 NOTE — Telephone Encounter (Signed)
Spoke with patient she states OTC supplements are Vit D and Fish oil she states she has been taking those for a long time and doesn't think they are related to her abdominal pain. She states she is due to see dentist next week about her crown. Patient is to call back to schedule an appt with [place] for follow up

## 2015-03-26 ENCOUNTER — Ambulatory Visit (INDEPENDENT_AMBULATORY_CARE_PROVIDER_SITE_OTHER): Payer: BLUE CROSS/BLUE SHIELD | Admitting: Sports Medicine

## 2015-03-26 ENCOUNTER — Encounter: Payer: Self-pay | Admitting: Sports Medicine

## 2015-03-26 ENCOUNTER — Ambulatory Visit
Admission: RE | Admit: 2015-03-26 | Discharge: 2015-03-26 | Disposition: A | Discharge status: Home / Self Care | Payer: BLUE CROSS/BLUE SHIELD | Source: Ambulatory Visit | Attending: Sports Medicine | Admitting: Sports Medicine

## 2015-03-26 VITALS — BP 136/66 | HR 89 | Ht 68.0 in | Wt 279.0 lb

## 2015-03-26 DIAGNOSIS — M25551 Pain in right hip: Secondary | ICD-10-CM

## 2015-03-26 DIAGNOSIS — M1611 Unilateral primary osteoarthritis, right hip: Secondary | ICD-10-CM

## 2015-03-26 NOTE — Progress Notes (Signed)
Stephanie Andrade - 59 y.o. female MRN [id number]  Date of birth: 01-30-1956   Stephanie Andrade is a 59 y.o. female who presents today for right hip pain. She has a history or right hip OA but reports that this pain feels different than before. The pain is occuring in her right buttock and is wrapping around to the anterior portion of her hip. She denies any injury. Pain is worse in the morning and when she is walking on it. At times her hip has given way. The pain ranges from 4-8/10.  Pain described as shooting in nature. She has had to increase the amount of oxycodone to 1/2 tablet in the morning and 1/2 tablet at night. The gabapentin has relieved her back pain but hasn't helped with her hip pain. She has been taking ibuprofen 800 mg 1 to two times daily with some improvement of the pain.   PMHx -  reviewed.  Contributory factors include: OA of right hip, depression, morbid obesity Medications - gabapentin, oxycodone, omeprazole, ambien, wellbutrin    ROS Per HPI   Exam:  Filed Vitals:   03/26/15 1343  BP: 136/66  Pulse: 89   Gen: NAD Cardiorespiratory - Normal respiratory effort/rate.   Back:  Appearance: no erythema or ecchymosis  Palpation: no tenderness of paraspinal muscles, no spinous process tenderness, Hip:  Appearance: symmetric, trendelenburg gait with ambulation  Palpation: TTP upon ischial tuberosity, no tenderness of greater trochanter  Rotation Reduced: pain reproduced with internal rotation on right and limited ROM, normal internal and external ROM on left,  Neuro: Strength hip flexion 5/5, hip adduction 5/5 on left, unable to perform hip adduction on right 2/2 pain,  knee extension 5/5, knee flexion 5/5, dorsiflexion 5/5, plantar flexion 5/5 Reflexes: patella 2/2 Bilateral  Achilles 2/2 Bilateral Straight Leg Raise: negative Sensation to light touch intact: yes Neurovascularly intact  Imaging:  2014 MRI right hip: moderate OA with prominent posterior labral  paralabral cyst

## 2015-03-27 NOTE — Assessment & Plan Note (Addendum)
Concern that she has worsening of her OA in right hip.  Reports of her hip/right leg giving way 2/2 to pain.  - right hip x-rays ordered  - continue current meds for pain control  - provided crutches for crutch walking over the next week. She will then ease her way away from the crutches as her pain dictates  - she can continue to exercise if no pain  - pending imaging results will dictate the strategy at this point. She may need to consider hip replacement with Dr. Mayer Camel.   Hip Xray is worse and shows severe DJD.  Will plan ortho consult.

## 2015-04-03 ENCOUNTER — Encounter: Payer: Self-pay | Admitting: *Deleted

## 2015-04-03 DIAGNOSIS — M1611 Unilateral primary osteoarthritis, right hip: Secondary | ICD-10-CM

## 2015-04-03 NOTE — Patient Instructions (Signed)
[facility] Dr. Mayer Camel Thursday September 1 at 315pm with arrival time of 245pm [address] [place], [place] [address] Phone: [phone]

## 2015-05-15 ENCOUNTER — Other Ambulatory Visit: Payer: Self-pay | Admitting: *Deleted

## 2015-05-15 MED ORDER — OXYCODONE-ACETAMINOPHEN 5-325 MG PO TABS: 1.0000 | ORAL_TABLET | Freq: Three times a day (TID) | ORAL | Status: DC | PRN

## 2015-05-22 ENCOUNTER — Encounter: Payer: Self-pay | Admitting: Sports Medicine

## 2015-05-22 ENCOUNTER — Ambulatory Visit (INDEPENDENT_AMBULATORY_CARE_PROVIDER_SITE_OTHER): Payer: BLUE CROSS/BLUE SHIELD | Admitting: Sports Medicine

## 2015-05-22 VITALS — Ht 68.0 in | Wt 250.0 lb

## 2015-05-22 DIAGNOSIS — M1611 Unilateral primary osteoarthritis, right hip: Secondary | ICD-10-CM

## 2015-05-22 DIAGNOSIS — M7061 Trochanteric bursitis, right hip: Secondary | ICD-10-CM | POA: Diagnosis not present

## 2015-05-22 MED ORDER — METHYLPREDNISOLONE ACETATE 40 MG/ML IJ SUSP
40.0000 mg | Freq: Once | INTRAMUSCULAR | Status: AC
  Administered 2015-05-22: 40 mg via INTRA_ARTICULAR

## 2015-05-22 NOTE — Progress Notes (Signed)
Subjective:    Patient ID: Stephanie Andrade, female    DOB: 1956-01-14, 59 y.o.   MRN: [id number]  HPI chief complaint: Right hip pain  Very pleasant 59 year old female comes in today complaining of long-standing right hip pain. She has a well-documented history of hip DJD. She saw Dr. Darrick Penna a few weeks ago and was referred to Dr. Turner Daniels. Dr. Turner Daniels recommended that the patient wait for total hip arthroplasty until her symptoms were more severe. He also wants her to lose weight before surgery The majority of her pain is along the lateral hip. She does get some groin pain as well. She has difficulty sleeping on her right hip at night. Some radiating pain down her thigh but no associated numbness or tingling. She has had cortisone injections in the past. She denies any recent trauma. She does take an occasional hydrocodone as needed for pain.  Interim medical history reviewed Medications reviewed Allergies reviewed    Review of Systems As above    Objective:   Physical Exam Obese. No acute distress. Awake alert and oriented 3. Vital signs reviewed.  Right hip: Limited passive internal rotation. This does cause some mild groin pain. She is tender to palpation directly over the greater trochanteric bursa. Negative straight leg raise. Neurovascularly intact distally.  X-rays of her right hip are reviewed. She has advanced right hip DJD. Nothing acute       Assessment & Plan:  Right hip pain secondary to greater trochanteric bursitis versus hip DJD Obesity  Patient's main pain generator seems to be her greater trochanteric bursa. Therefore I've elected to inject this area with cortisone today. She works with the physical therapist and I would like for her to start doing some very light hip abductor exercises and IT band stretching. She will follow-up with me in 3 weeks for reevaluation. If symptoms persist then I would consider an intra-articular injection. She understands that definitive  treatment for her right hip is total hip arthroplasty but she has been instructed by Dr. Turner Daniels to lose some weight before proceeding with surgery.  Consent obtained and verified. Time-out conducted. Noted no overlying erythema, induration, or other signs of local infection. Skin prepped in a sterile fashion. Topical analgesic spray: Ethyl chloride. Joint: right greater trochanter Needle: 22g spinal Completed without difficulty. Meds: 2cc (80mg ) depomedrol and 6cc 1% xylocaine  Advised to call if fevers/chills, erythema, induration, drainage, or persistent bleeding.

## 2015-06-18 ENCOUNTER — Ambulatory Visit: Payer: BLUE CROSS/BLUE SHIELD | Admitting: Sports Medicine

## 2015-07-01 ENCOUNTER — Encounter: Payer: Self-pay | Admitting: Sports Medicine

## 2015-07-01 ENCOUNTER — Ambulatory Visit (INDEPENDENT_AMBULATORY_CARE_PROVIDER_SITE_OTHER): Payer: BLUE CROSS/BLUE SHIELD | Admitting: Sports Medicine

## 2015-07-01 VITALS — BP 132/68 | Ht 68.0 in | Wt 255.0 lb

## 2015-07-01 DIAGNOSIS — M1611 Unilateral primary osteoarthritis, right hip: Secondary | ICD-10-CM

## 2015-07-01 NOTE — Progress Notes (Signed)
Subjective:    Patient ID: Stephanie Andrade, female    DOB: 1956-08-12, 59 y.o.   MRN: [id number]  HPI   Patient comes in today for follow-up. Lateral right hip pain has improved after a recent cortisone injection into the greater trochanteric bursa. In fact, she states she is about 50% better. She has been participating in water exercise classes 4 times a week. She has also been seeing an acupuncturist who has been doing some soft tissue release around her hip. She feels like this is helpful. Majority of her residual pain is in the posterior hip with occasional anterior hip pain as well. It is most noticeable when standing for long periods of time and with going up and down stairs. She has lost 15 pounds since her last office visit.    Review of Systems     Objective:   Physical Exam Overweight. No acute distress  Right hip: Limited passive internal and external rotation. There is no tenderness to palpation along the lateral hip. She does have pain with resisted hip flexion. Neurovascularly intact distally. Walking with a limp.       Assessment & Plan:  Right hip pain secondary to dense DJD  Although some of her pain has improved with the greater trochanteric bursal injection, her real issue is her hip DJD. She is making a great effort toward weight loss in hopes of being able to one day have a total hip arthroplasty. She has seen Dr. Turner Daniels in the past for this. I've encouraged her to continue with her acupuncture treatment as well as her water exercise classes. She takes an occasional oxycodone which does help with her pain. She is asking about future refills. She is taking it quite responsibly and I've agreed to give her one additional refill when needed. However, she understands that we are not a chronic pain clinic and that definitive treatment for her hip is total hip arthroplasty. Follow-up as needed.

## 2015-08-01 ENCOUNTER — Other Ambulatory Visit: Payer: Self-pay | Admitting: Internal Medicine

## 2015-08-01 NOTE — Telephone Encounter (Signed)
Due for CPE after Oct 05, 2015. Please call and schedule and refill until then

## 2015-08-07 ENCOUNTER — Other Ambulatory Visit: Payer: Self-pay | Admitting: *Deleted

## 2015-08-07 ENCOUNTER — Telehealth: Payer: Self-pay | Admitting: *Deleted

## 2015-08-07 MED ORDER — OXYCODONE-ACETAMINOPHEN 5-325 MG PO TABS: 1.0000 | ORAL_TABLET | Freq: Three times a day (TID) | ORAL | Status: DC | PRN

## 2015-08-07 NOTE — Telephone Encounter (Signed)
Refilled her oxycodone this one time per Draper's last note.

## 2015-09-30 ENCOUNTER — Ambulatory Visit (INDEPENDENT_AMBULATORY_CARE_PROVIDER_SITE_OTHER): Payer: BLUE CROSS/BLUE SHIELD | Admitting: Internal Medicine

## 2015-09-30 VITALS — BP 130/76 | HR 108 | Temp 99.3°F | Resp 20 | Ht 68.0 in | Wt 258.0 lb

## 2015-09-30 DIAGNOSIS — J069 Acute upper respiratory infection, unspecified: Secondary | ICD-10-CM | POA: Diagnosis not present

## 2015-09-30 DIAGNOSIS — J9801 Acute bronchospasm: Secondary | ICD-10-CM

## 2015-09-30 DIAGNOSIS — I10 Essential (primary) hypertension: Secondary | ICD-10-CM | POA: Diagnosis not present

## 2015-09-30 MED ORDER — ALBUTEROL SULFATE HFA 108 (90 BASE) MCG/ACT IN AERS: 2.0000 | INHALATION_SPRAY | Freq: Four times a day (QID) | RESPIRATORY_TRACT | Status: DC | PRN

## 2015-09-30 MED ORDER — VALACYCLOVIR HCL 500 MG PO TABS: 500.0000 mg | ORAL_TABLET | Freq: Two times a day (BID) | ORAL | Status: DC

## 2015-09-30 MED ORDER — LEVOFLOXACIN 500 MG PO TABS: 500.0000 mg | ORAL_TABLET | Freq: Every day | ORAL | Status: DC

## 2015-09-30 MED ORDER — CLARITHROMYCIN 500 MG PO TABS: 500.0000 mg | ORAL_TABLET | Freq: Two times a day (BID) | ORAL | Status: DC

## 2015-09-30 MED ORDER — PREDNISONE 10 MG PO TABS: ORAL_TABLET | ORAL | Status: DC

## 2015-09-30 NOTE — Patient Instructions (Addendum)
Biaxin 500 milligrams twice daily for 10 days. Albuterol inhaler 2 sprays 4 times daily as needed for wheezing. Prednisone in tapering course as directed.

## 2015-10-07 ENCOUNTER — Other Ambulatory Visit: Payer: BLUE CROSS/BLUE SHIELD | Admitting: Internal Medicine

## 2015-10-07 DIAGNOSIS — Z1329 Encounter for screening for other suspected endocrine disorder: Secondary | ICD-10-CM

## 2015-10-07 DIAGNOSIS — R7309 Other abnormal glucose: Secondary | ICD-10-CM

## 2015-10-07 DIAGNOSIS — E559 Vitamin D deficiency, unspecified: Secondary | ICD-10-CM

## 2015-10-07 DIAGNOSIS — I10 Essential (primary) hypertension: Secondary | ICD-10-CM

## 2015-10-07 DIAGNOSIS — Z0000 Encounter for general adult medical examination without abnormal findings: Secondary | ICD-10-CM

## 2015-10-07 LAB — CBC WITH DIFFERENTIAL/PLATELET
BASOS ABS: 0 10*3/uL (ref 0.0–0.1)
BASOS PCT: 0 % (ref 0–1)
EOS ABS: 0.2 10*3/uL (ref 0.0–0.7)
EOS PCT: 4 % (ref 0–5)
HCT: 37.6 % (ref 36.0–46.0)
Hemoglobin: 12.1 g/dL (ref 12.0–15.0)
Lymphocytes Relative: 42 % (ref 12–46)
Lymphs Abs: 2.6 10*3/uL (ref 0.7–4.0)
MCH: 26.1 pg (ref 26.0–34.0)
MCHC: 32.2 g/dL (ref 30.0–36.0)
MCV: 81.2 fL (ref 78.0–100.0)
MPV: 9.7 fL (ref 8.6–12.4)
Monocytes Absolute: 0.3 10*3/uL (ref 0.1–1.0)
Monocytes Relative: 5 % (ref 3–12)
Neutro Abs: 3 10*3/uL (ref 1.7–7.7)
Neutrophils Relative %: 49 % (ref 43–77)
Platelets: 195 10*3/uL (ref 150–400)
RBC: 4.63 MIL/uL (ref 3.87–5.11)
RDW: 14.1 % (ref 11.5–15.5)
WBC: 6.1 10*3/uL (ref 4.0–10.5)

## 2015-10-08 LAB — LIPID PANEL
CHOLESTEROL: 139 mg/dL (ref 125–200)
HDL: 44 mg/dL — ABNORMAL LOW (ref >=46)
LDL CALC: 67 mg/dL (ref <130)
Total CHOL/HDL Ratio: 3.2 Ratio (ref <=5.0)
Triglycerides: 139 mg/dL (ref <150)
VLDL: 28 mg/dL (ref <30)

## 2015-10-08 LAB — HEMOGLOBIN A1C
HEMOGLOBIN A1C: 5.9 % — AB (ref <5.7)
MEAN PLASMA GLUCOSE: 123 mg/dL — AB (ref <117)

## 2015-10-08 LAB — COMPLETE METABOLIC PANEL WITH GFR
ALT: 56 U/L — AB (ref 6–29)
AST: 32 U/L (ref 10–35)
Albumin: 3.9 g/dL (ref 3.6–5.1)
Alkaline Phosphatase: 63 U/L (ref 33–130)
BILIRUBIN TOTAL: 0.5 mg/dL (ref 0.2–1.2)
BUN: 19 mg/dL (ref 7–25)
CO2: 31 mmol/L (ref 20–31)
CREATININE: 0.66 mg/dL (ref 0.50–1.05)
Calcium: 9 mg/dL (ref 8.6–10.4)
Chloride: 103 mmol/L (ref 98–110)
GFR, Est African American: >89 mL/min (ref >=60)
Glucose, Bld: 86 mg/dL (ref 65–99)
Potassium: 4.9 mmol/L (ref 3.5–5.3)
SODIUM: 141 mmol/L (ref 135–146)
TOTAL PROTEIN: 6.6 g/dL (ref 6.1–8.1)

## 2015-10-08 LAB — TSH: TSH: 0.81 mIU/L

## 2015-10-08 LAB — VITAMIN D 25 HYDROXY (VIT D DEFICIENCY, FRACTURES): VIT D 25 HYDROXY: 62 ng/mL (ref 30–100)

## 2015-10-11 ENCOUNTER — Encounter: Payer: Self-pay | Admitting: Internal Medicine

## 2015-10-11 ENCOUNTER — Ambulatory Visit (INDEPENDENT_AMBULATORY_CARE_PROVIDER_SITE_OTHER): Payer: BLUE CROSS/BLUE SHIELD | Admitting: Internal Medicine

## 2015-10-11 VITALS — BP 128/70 | HR 92 | Temp 98.2°F | Resp 22 | Ht 68.0 in | Wt 265.0 lb

## 2015-10-11 DIAGNOSIS — R748 Abnormal levels of other serum enzymes: Secondary | ICD-10-CM

## 2015-10-11 DIAGNOSIS — G8929 Other chronic pain: Secondary | ICD-10-CM

## 2015-10-11 DIAGNOSIS — I10 Essential (primary) hypertension: Secondary | ICD-10-CM | POA: Diagnosis not present

## 2015-10-11 DIAGNOSIS — E669 Obesity, unspecified: Secondary | ICD-10-CM | POA: Diagnosis not present

## 2015-10-11 DIAGNOSIS — M545 Low back pain, unspecified: Secondary | ICD-10-CM

## 2015-10-11 DIAGNOSIS — Z0000 Encounter for general adult medical examination without abnormal findings: Secondary | ICD-10-CM

## 2015-10-11 DIAGNOSIS — J209 Acute bronchitis, unspecified: Secondary | ICD-10-CM | POA: Diagnosis not present

## 2015-10-11 DIAGNOSIS — R7302 Impaired glucose tolerance (oral): Secondary | ICD-10-CM

## 2015-10-11 DIAGNOSIS — Z8709 Personal history of other diseases of the respiratory system: Secondary | ICD-10-CM | POA: Diagnosis not present

## 2015-10-11 DIAGNOSIS — M25551 Pain in right hip: Secondary | ICD-10-CM | POA: Diagnosis not present

## 2015-10-11 DIAGNOSIS — M25562 Pain in left knee: Secondary | ICD-10-CM | POA: Diagnosis not present

## 2015-10-11 DIAGNOSIS — Z8659 Personal history of other mental and behavioral disorders: Secondary | ICD-10-CM

## 2015-10-11 LAB — POCT URINALYSIS DIPSTICK
Bilirubin, UA: NEGATIVE
Blood, UA: NEGATIVE
GLUCOSE UA: NEGATIVE
Ketones, UA: NEGATIVE
LEUKOCYTES UA: NEGATIVE
Nitrite, UA: NEGATIVE
Protein, UA: NEGATIVE
Spec Grav, UA: >=1.03
UROBILINOGEN UA: 0.2
pH, UA: 7

## 2015-10-11 MED ORDER — PREDNISONE 10 MG PO TABS: ORAL_TABLET | ORAL | Status: DC

## 2015-10-11 MED ORDER — ZOLPIDEM TARTRATE 5 MG PO TABS: 5.0000 mg | ORAL_TABLET | Freq: Every evening | ORAL | Status: DC | PRN

## 2015-10-11 MED ORDER — CLARITHROMYCIN 500 MG PO TABS: 500.0000 mg | ORAL_TABLET | Freq: Two times a day (BID) | ORAL | Status: DC

## 2015-10-11 MED ORDER — NALTREXONE-BUPROPION HCL ER 8-90 MG PO TB12: 8.0000 mg | ORAL_TABLET | Freq: Two times a day (BID) | ORAL | Status: DC

## 2015-10-11 MED ORDER — LOSARTAN POTASSIUM 50 MG PO TABS: 50.0000 mg | ORAL_TABLET | Freq: Every day | ORAL | Status: DC

## 2015-10-11 NOTE — Patient Instructions (Signed)
Repeat Biaxin and prednisone as directed. Try Contrave  8/90 twice daily. Return in 4 weeks for weight check and office visit on Contrave. Discontinue Wellbutrin. Cannot take oxycodone with Contrave. Have ultrasound of liver due to elevated liver function.

## 2015-10-11 NOTE — Progress Notes (Signed)
Subjective:    Patient ID: Stephanie Andrade, female    DOB: April 21, 1956, 60 y.o.   MRN: [id number]  HPI 60 year old Female in today for health maintenance exam and evaluation of medical issues. Has a lot of musculoskeletal issues. Currently has knee injury from PT. Doing water aerobics 4 nights  a week. Still has residual URI so will refill Biaxin and Prednisone.  She has a history of essential hypertension, impaired glucose tolerance, history of sarcoidosis diagnosed in 1995 currently asymptomatic, history of asthma, history of vitamin D deficiency, history of chronic musculoskeletal pain back, hip, knee. History of obesity.  GYN has her on estrogen replacement.  Had Tdap vaccine February 2011.  Left heel fracture 2002. Bilateral carpal tunnel syndrome diagnosed 1997. His been on Benicar HCTZ since 2008 for hypertension.  Intolerant of mushrooms that cause hives. Codeine causes vomiting. Cannot tolerate hydrocodone, Tessalon Perles or dextromethorphan.  Had colonoscopy done by Dr. Odetta Pink in 2009.  Social history: She is married. No children. Four-year college degree. 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 Social alcohol consumption. Does not smoke.  Family history: Brother died with history of hypertension and alcoholism. Father died of, patient's of dementia. Mother died at age 65 of ovarian cancer. One sister in good health. Older brother living with hypertension and hyperlipidemia. Father had hyperlipidemia. Dr. Turner Daniels said she did not need hip replacement. Lots of low back pain. Not willing to have bariatric surgery at this point. Wants to try Contrave. Will need to stop Wellbutrin and Oxycodone.   Review of Systems  Constitutional: Positive for fatigue.  Respiratory: Positive for cough.   Musculoskeletal:       Right musculoskeletal pain  Psychiatric/Behavioral:       History of depression treated with Wellbutrin   discussion with her about consideration of  bariatric surgery but she says family members do not want her to have this. She wants to try Contrave for weight loss. It contains Wellbutrin which she already takes. That will need to be discontinued. Contrave needs prior authorization approval.     Objective:   Physical Exam  Constitutional: She is oriented to person, place, and time. She appears well-developed and well-nourished. No distress.  HENT:  Head: Normocephalic and atraumatic.  Right Ear: External ear normal.  Left Ear: External ear normal.  Mouth/Throat: Oropharynx is clear and moist. No oropharyngeal exudate.  Eyes: Conjunctivae are normal. Pupils are equal, round, and reactive to light. Right eye exhibits no discharge. Left eye exhibits no discharge. No scleral icterus.  Neck: Neck supple. No JVD present. No thyromegaly present.  Cardiovascular: Normal rate, regular rhythm, normal heart sounds and intact distal pulses.   No murmur heard. Breast normal female without masses  Pulmonary/Chest: Effort normal and breath sounds normal. She has no wheezes. She has no rales.  Abdominal: Soft. Bowel sounds are normal. She exhibits no distension and no mass. There is no tenderness. There is no rebound and no guarding.  Genitourinary:  Deferred to GYN  Musculoskeletal: She exhibits no edema.  Lymphadenopathy:    She has no cervical adenopathy.  Neurological: She is alert and oriented to person, place, and time. She has normal reflexes. No cranial nerve deficit. Coordination normal.  Skin: Skin is warm and dry. No rash noted. She is not diaphoretic.  Psychiatric: She has a normal mood and affect. Her behavior is normal. Judgment and thought content normal.  Vitals reviewed.         Assessment &  Plan:  History of asthma-has inhalers. Will need Prevnar at next visit.  Obesity-trial of Contrave and return in 4 weeks  Essential hypertension-continue losartan  Chronic back pain-seen by Dr.  Roanna Epley and Dr. Turner Daniels  Left knee  pain  Acute bronchitis - protected URI-repeat Biaxin and prednisone taper  Elevated liver function-to have liver ultrasound.  Impaired glucose tolerance-continue diet and exercise efforts  Plan: Return in 4 weeks. Will need Prevnar at that time. Repeat prednisone and Biaxin as previously prescribed. To have liver ultrasound.

## 2015-10-13 ENCOUNTER — Encounter: Payer: Self-pay | Admitting: Internal Medicine

## 2015-10-13 NOTE — Progress Notes (Signed)
Subjective:    Patient ID: Stephanie Andrade, female    DOB: 09-Jan-1956, 60 y.o.   MRN: [id number]  HPI   Patient has come down with respiratory infection. Has had cough and congestion. She has history of asthma. Denies significant wheezing or shortness of breath.    Review of Systems     Objective:   Physical Exam  Skin warm and dry. Nodes none. Pharynx slightly injected. TMs are clear. Neck is supple without adenopathy. Chest fairly clear to auscultation with occasional scattered wheezing      Assessment & Plan:  Acute URI  History of asthma  Bronchospasm  History of hypertension  Plan: Biaxin 500 mg twice daily for 10 days. Sterapred DS 10 mg 6 day dosepak. Albuterol inhaler 2 sprays by mouth 4 times daily. Has upcoming physical exam in the near future. Call if not better in 7-10 days or sooner if worse.

## 2015-10-21 ENCOUNTER — Other Ambulatory Visit: Payer: BLUE CROSS/BLUE SHIELD

## 2015-11-12 ENCOUNTER — Ambulatory Visit: Payer: BLUE CROSS/BLUE SHIELD | Admitting: Internal Medicine

## 2015-11-14 ENCOUNTER — Other Ambulatory Visit: Payer: Self-pay | Admitting: *Deleted

## 2015-11-14 DIAGNOSIS — M25552 Pain in left hip: Secondary | ICD-10-CM

## 2015-11-19 ENCOUNTER — Other Ambulatory Visit: Payer: Self-pay | Admitting: *Deleted

## 2015-11-19 DIAGNOSIS — M1611 Unilateral primary osteoarthritis, right hip: Secondary | ICD-10-CM

## 2015-11-20 ENCOUNTER — Ambulatory Visit: Payer: BLUE CROSS/BLUE SHIELD | Admitting: Sports Medicine

## 2015-11-20 ENCOUNTER — Ambulatory Visit
Admission: RE | Admit: 2015-11-20 | Discharge: 2015-11-20 | Disposition: A | Discharge status: Home / Self Care | Payer: BLUE CROSS/BLUE SHIELD | Source: Ambulatory Visit | Attending: Sports Medicine | Admitting: Sports Medicine

## 2015-11-20 ENCOUNTER — Other Ambulatory Visit: Payer: Self-pay | Admitting: Sports Medicine

## 2015-11-20 DIAGNOSIS — M25552 Pain in left hip: Secondary | ICD-10-CM

## 2015-11-20 DIAGNOSIS — M1611 Unilateral primary osteoarthritis, right hip: Secondary | ICD-10-CM

## 2015-11-26 ENCOUNTER — Ambulatory Visit (INDEPENDENT_AMBULATORY_CARE_PROVIDER_SITE_OTHER): Payer: BLUE CROSS/BLUE SHIELD | Admitting: Sports Medicine

## 2015-11-26 ENCOUNTER — Encounter: Payer: Self-pay | Admitting: Sports Medicine

## 2015-11-26 VITALS — BP 148/68 | Ht 68.0 in | Wt 265.0 lb

## 2015-11-26 DIAGNOSIS — M160 Bilateral primary osteoarthritis of hip: Secondary | ICD-10-CM

## 2015-11-26 DIAGNOSIS — M1612 Unilateral primary osteoarthritis, left hip: Secondary | ICD-10-CM | POA: Diagnosis not present

## 2015-11-26 DIAGNOSIS — M1611 Unilateral primary osteoarthritis, right hip: Secondary | ICD-10-CM | POA: Diagnosis not present

## 2015-11-26 DIAGNOSIS — M129 Arthropathy, unspecified: Secondary | ICD-10-CM

## 2015-11-26 MED ORDER — MELOXICAM 15 MG PO TABS: 15.0000 mg | ORAL_TABLET | Freq: Every day | ORAL | Status: DC

## 2015-11-26 NOTE — Assessment & Plan Note (Signed)
Arthritis of left hip has progressed and causes a lot of pain in groing Problem is that both left and right give her a very unstable trendelenburg gait.  I think she needs to progress to THR sooner than later although left good be delayed  Otherwise the continued abn gait will lead to more severe limitations and progressive LB issues as well

## 2015-11-26 NOTE — Assessment & Plan Note (Signed)
Pt. Returns today for follow up. Her symptoms are most certainly worse. She is unable to really rest at night due to the pain. This has also impacted her ability to lose weight. She had been using low dose oxycodone with some slight relief, but felt ibuprofen was actually the best, though this is not taking all of her pain away. She has stopped the oxycodone however due to taking Contrave. She continues in water aerobics which is probably beneficial. She is considering reevaluation for hip replacement.  - Take Mobic 15mg  - Tylenol in addition to mobic.  - Continue water aerobics.  - Continue attempts at weight loss.  - Follow up / referral to orthopedics as needed when she is ready in the future.

## 2015-11-26 NOTE — Progress Notes (Signed)
[facility] Aquilla Hacker, MD Phone: [phone]  Subjective:   # Right Hip Osteoarthritis - Here for follow up.  - Had Films of her hips performed 6 days ago.  - Has worsening pain since seen previously, and had PT for 4 months with little improvement.  - She cannot sleep at night due to the pain.  - She has difficulty ambulating normally.  - Crutches have not helped.  - She was using the oxycodone for pain control, but stopped due to starting Contrave weight loss medicine.  - She has regained the weight that she lost previouly.  - She now has pain on the left side as well. Groin pain primarily.  - She gets some low back pain  - She has no numbness, tingling, or weakness into either leg.  - She says the medicine that actually helps the most with her pain is 800mg  Ibuprofen BID.  - She has been evaluated by orthopedic surgery for hip replacement previously, but has postponed surgery due to needing to lose weight and attempt at conservative measures first.   All relevant systems were reviewed and were negative unless otherwise noted in the HPI  Past Medical History Reviewed problem list.  Medications- reviewed and updated Current Outpatient Prescriptions  Medication Sig Dispense Refill  . albuterol (PROVENTIL HFA;VENTOLIN HFA) 108 (90 Base) MCG/ACT inhaler Inhale 2 puffs into the lungs every 6 (six) hours as needed for wheezing or shortness of breath. 1 Inhaler 2  . clarithromycin (BIAXIN) 500 MG tablet Take 1 tablet (500 mg total) by mouth 2 (two) times daily. 20 tablet 0  . diazepam (VALIUM) 5 MG tablet For muscle spasm 30 tablet 2  . gabapentin (NEURONTIN) 300 MG capsule Take 1 capsule (300 mg total) by mouth at bedtime. See additional directions from office visit notes. 60 capsule 2  . ibuprofen (ADVIL,MOTRIN) 200 MG tablet Take 200 mg by mouth every 6 (six) hours as needed.      Marland Kitchen losartan (COZAAR) 50 MG tablet Take 1 tablet (50 mg total) by mouth  daily. 90 tablet 3  . meloxicam (MOBIC) 15 MG tablet Take 1 tablet (15 mg total) by mouth daily. 30 tablet 4  . methocarbamol (ROBAXIN) 500 MG tablet Take 1 tablet (500 mg total) by mouth every 12 (twelve) hours as needed for muscle spasms. 60 tablet 1  . multivitamin (THERAGRAN) per tablet Take 1 tablet by mouth daily.      . mupirocin ointment (BACTROBAN) 2 % Place 1 application into the nose 2 (two) times daily. 22 g 0  . Naltrexone-Bupropion HCl ER (CONTRAVE) 8-90 MG TB12 Take 8-90 mg by mouth 2 (two) times daily. 60 tablet 0  . predniSONE (DELTASONE) 10 MG tablet Take in tapering course as directed 6-5-4-3-2-1 taper 21 tablet 0  . valACYclovir (VALTREX) 500 MG tablet Take 1 tablet (500 mg total) by mouth 2 (two) times daily. 10 tablet PRN  . zolpidem (AMBIEN) 5 MG tablet Take 1 tablet (5 mg total) by mouth at bedtime as needed for sleep. 30 tablet 0   No current facility-administered medications for this visit.   Chief complaint-noted No additions to family history Social history- patient is a non smoker  Objective: BP 148/68 mmHg  Ht 5\' 8"  (1.727 m)  Wt 265 lb (120.203 kg)  BMI 40.30 kg/m2  LMP 04/06/2011 Hip: ROM IR: 20 Deg on R, 35 Deg L, ER: 10 Deg R, 20 Deg L, Flexion: 110 Deg R, 120 Deg  L, Extension: 100 Deg BL, Abduction: 45 Deg BL, Adduction: 45 Deg BL Strength IR: 5/5, ER: 5/5, Flexion: 5/5, Extension: 5/5, Abduction: 5/5, Adduction: 5/5 Pelvic alignment unremarkable to inspection and palpation. Standing hip rotation and gait with Bilateral trendelenburg sign / unsteadiness. Greater trochanter without tenderness to palpation Bilaterally  She has significant pain with passive and active rotation of her hips R > L Seated on RT she gets 5 to 10 deg IR and 20 deg ER Seated on left she gets 10 to 15 deg IR and 35 ER No tenderness over piriformis. Groin Pain with FABER / FADIR  No SI joint tenderness and normal minimal SI movement.  Imaging:  Bilateral Hip Series X-Ray:    FINDINGS: There is significant age advanced degenerative joint disease involving the hips right much greater than left. On the right there is complete loss of joint space with sclerosis, spurring, and vacuum disc phenomenon. On the left there is some loss of joint space with sclerosis and mild spurring. The pelvic rami are intact. The SI joints are corticated  IMPRESSION: Degenerative joint disease of the hips right much greater than left.  Assessment/Plan:  Osteoarthritis of right hip Pt. Returns today for follow up. Her symptoms are most certainly worse. She is unable to really rest at night due to the pain. This has also impacted her ability to lose weight. She had been using low dose oxycodone with some slight relief, but felt ibuprofen was actually the best, though this is not taking all of her pain away. She has stopped the oxycodone however due to taking Contrave. She continues in water aerobics which is probably beneficial. She is considering reevaluation for hip replacement.  - Take Mobic 15mg  - Tylenol in addition to mobic.  - Continue water aerobics.  - Continue attempts at weight loss.  - Follow up / referral to orthopedics as needed when she is ready in the future.

## 2015-12-09 ENCOUNTER — Encounter: Payer: Self-pay | Admitting: Internal Medicine

## 2015-12-09 ENCOUNTER — Ambulatory Visit (INDEPENDENT_AMBULATORY_CARE_PROVIDER_SITE_OTHER): Payer: BLUE CROSS/BLUE SHIELD | Admitting: Internal Medicine

## 2015-12-09 VITALS — BP 118/62 | HR 86 | Temp 97.9°F | Resp 18 | Ht 68.0 in | Wt 254.0 lb

## 2015-12-09 DIAGNOSIS — E669 Obesity, unspecified: Secondary | ICD-10-CM | POA: Diagnosis not present

## 2015-12-09 DIAGNOSIS — M1611 Unilateral primary osteoarthritis, right hip: Secondary | ICD-10-CM | POA: Diagnosis not present

## 2015-12-09 DIAGNOSIS — G4700 Insomnia, unspecified: Secondary | ICD-10-CM | POA: Diagnosis not present

## 2015-12-09 DIAGNOSIS — R7401 Elevation of levels of liver transaminase levels: Secondary | ICD-10-CM

## 2015-12-09 DIAGNOSIS — R740 Nonspecific elevation of levels of transaminase and lactic acid dehydrogenase [LDH]: Secondary | ICD-10-CM

## 2015-12-09 MED ORDER — ZOLPIDEM TARTRATE 5 MG PO TABS: 5.0000 mg | ORAL_TABLET | Freq: Every evening | ORAL | Status: DC | PRN

## 2015-12-09 MED ORDER — CONTRAVE 8-90 MG PO TB12: ORAL_TABLET | ORAL | Status: DC

## 2015-12-09 NOTE — Patient Instructions (Signed)
Contrave and Ambien refilled. Continue working with [employer]. Return here in 3 months. Liver functions pending.

## 2015-12-09 NOTE — Progress Notes (Signed)
Subjective:    Patient ID: Stephanie Andrade, female    DOB: 1955/08/30, 60 y.o.   MRN: [id number]  HPI Patient is here to follow-up on weight loss regimen with Contrave. Recently started Weight Watchers. She is able to tolerate Contrave quite well. Is adjusting to lower dose of bupropion which is in Contrave. Used to take higher dose. Has lost 11 pounds since February 2017. I am pleased with that. She saw Dr. Darrick Penna recently who thinks she may well need to have hip replacement surgery. PT has indicated they do not feel like can be of more help at this point in time. She's been taking meloxicam and when necessary Tylenol. In February she had mildly elevated SGPT of 56. We recommended ultrasound of her abdomen. Suspected she might have fatty liver. She never went for that appointment. She is asking to have repeat lab work at this point in time and we will go from there. She may not need to have ultrasound of abdomen if SGPT has normalized.    Review of Systems     Objective:   Physical Exam  Not examined. Ambien refilled at her request.  Contrave refilled.      Assessment & Plan:  Obesity  Plan: Continue Contrave and follow-up in 3 months. Liver panel pending.

## 2015-12-10 LAB — HEPATIC FUNCTION PANEL
ALBUMIN: 4.5 g/dL (ref 3.6–5.1)
ALK PHOS: 64 U/L (ref 33–130)
ALT: 32 U/L — AB (ref 6–29)
AST: 26 U/L (ref 10–35)
BILIRUBIN INDIRECT: 0.3 mg/dL (ref 0.2–1.2)
Bilirubin, Direct: 0.1 mg/dL (ref <=0.2)
TOTAL PROTEIN: 6.9 g/dL (ref 6.1–8.1)
Total Bilirubin: 0.4 mg/dL (ref 0.2–1.2)

## 2015-12-17 ENCOUNTER — Other Ambulatory Visit: Payer: Self-pay | Admitting: *Deleted

## 2015-12-17 DIAGNOSIS — M1611 Unilateral primary osteoarthritis, right hip: Secondary | ICD-10-CM

## 2016-03-11 DIAGNOSIS — M1611 Unilateral primary osteoarthritis, right hip: Secondary | ICD-10-CM | POA: Diagnosis not present

## 2016-03-11 DIAGNOSIS — M545 Low back pain: Secondary | ICD-10-CM | POA: Diagnosis not present

## 2016-03-16 ENCOUNTER — Encounter (HOSPITAL_COMMUNITY): Payer: Self-pay

## 2016-03-16 ENCOUNTER — Other Ambulatory Visit: Payer: Self-pay | Admitting: Internal Medicine

## 2016-03-16 ENCOUNTER — Other Ambulatory Visit: Payer: Self-pay | Admitting: Physician Assistant

## 2016-03-16 NOTE — Telephone Encounter (Signed)
Refill x 3 months 

## 2016-03-16 NOTE — Pre-Procedure Instructions (Addendum)
Stephanie Andrade  03/16/2016      [address], [place] [address] Phone: [phone] Fax: [phone]    Your procedure is scheduled on Tuesday, August 8.  Report to [facility] Admitting at  12:25  P.M.                 Your surgery or procedure is scheduled for 2:25PM                  Call this number if you have problems the morning of surgery:[phone]               For any other questions, please call [phone], Monday - Friday 8 AM - 4 PM.   Remember:  Do not eat food or drink liquids after midnight Monday, August 7.    Take these medicines the morning of surgery with A SIP OF WATER: Acetaminophen (Tylenol) if needed, Albuterol inhaler if needed (please bring inhaler with you to the hospital), Wellbutrin, Oxycodone-Acetaminophen if needed.                                 1 Week prior to surgery STOP taking Meloxicam (Mobic), Aspirin , Aspirin Products (Goody Powder, Excedrin Migraine), Ibuprofen (Advil), Naproxen (Aleve), Viatiams and Herbal Products (ie Fish Oil), Vitamins.    Do not wear jewelry, make-up or nail polish.  Do not wear lotions, powders, or perfumes.    Do not shave 48 hours prior to surgery.  Do not bring valuables to the hospital.  [facility] is not responsible for any belongings or valuables.  Contacts, dentures or bridgework may not be worn into surgery.  Leave your suitcase in the car.  After surgery it may be brought to your room.  For patients admitted to the hospital, discharge time will be determined by your treatment team.  Special instructions:  Preparing for Surgery.   Please read over the following fact sheets that you were given.  Patient Instructions for Mupirocin Application,     [place]- Preparing For Surgery  Before surgery, you can play an important role. Because skin is not sterile, your skin needs to be as free of germs as  possible. You can reduce the number of germs on your skin by washing with CHG (chlorahexidine gluconate) Soap before surgery.  CHG is an antiseptic cleaner which kills germs and bonds with the skin to continue killing germs even after washing.  Please do not use if you have an allergy to CHG or antibacterial soaps. If your skin becomes reddened/irritated stop using the CHG.  Do not shave (including legs and underarms) for at least 48 hours prior to first CHG shower. It is OK to shave your face.  Please follow these instructions carefully.   1. Shower the NIGHT BEFORE SURGERY and the MORNING OF SURGERY with CHG.   2. If you chose to wash your hair, wash your hair first as usual with your normal shampoo.  3. After you shampoo, rinse your hair and body thoroughly to remove the shampoo.  4. Use CHG as you would any other liquid soap. You can apply CHG directly to the skin and wash gently with a scrungie or a clean washcloth.   5. Apply the CHG Soap to your body ONLY FROM THE NECK DOWN.  Do not use on  open wounds or open sores. Avoid contact with your eyes, ears, mouth and genitals (private parts). Wash genitals (private parts) with your normal soap.  6. Wash thoroughly, paying special attention to the area where your surgery will be performed.  7. Thoroughly rinse your body with warm water from the neck down.  8. DO NOT shower/wash with your normal soap after using and rinsing off the CHG Soap.  9. Pat yourself dry with a CLEAN TOWEL.   10. Wear CLEAN PAJAMAS   11. Place CLEAN SHEETS on your bed the night of your first shower and DO NOT SLEEP WITH PETS.   Day of Surgery: Do not apply any deodorants/lotions. Please wear clean clothes to the hospital/surgery center.

## 2016-03-17 ENCOUNTER — Encounter (HOSPITAL_COMMUNITY)
Admission: RE | Admit: 2016-03-17 | Discharge: 2016-03-17 | Disposition: A | Discharge status: Home / Self Care | Payer: BLUE CROSS/BLUE SHIELD | Source: Ambulatory Visit | Attending: Orthopaedic Surgery | Admitting: Orthopaedic Surgery

## 2016-03-17 ENCOUNTER — Encounter (HOSPITAL_COMMUNITY): Payer: Self-pay

## 2016-03-17 DIAGNOSIS — M1611 Unilateral primary osteoarthritis, right hip: Secondary | ICD-10-CM | POA: Diagnosis not present

## 2016-03-17 DIAGNOSIS — Z01812 Encounter for preprocedural laboratory examination: Secondary | ICD-10-CM | POA: Diagnosis not present

## 2016-03-17 DIAGNOSIS — Z01818 Encounter for other preprocedural examination: Secondary | ICD-10-CM | POA: Diagnosis not present

## 2016-03-17 DIAGNOSIS — I10 Essential (primary) hypertension: Secondary | ICD-10-CM | POA: Diagnosis not present

## 2016-03-17 HISTORY — DX: Essential (primary) hypertension: I10

## 2016-03-17 HISTORY — DX: Personal history of other diseases of the respiratory system: Z87.09

## 2016-03-17 HISTORY — DX: Family history of other specified conditions: Z84.89

## 2016-03-17 HISTORY — DX: Presence of spectacles and contact lenses: Z97.3

## 2016-03-17 HISTORY — DX: Depression, unspecified: F32.A

## 2016-03-17 HISTORY — DX: Major depressive disorder, single episode, unspecified: F32.9

## 2016-03-17 HISTORY — DX: Unspecified osteoarthritis, unspecified site: M19.90

## 2016-03-17 LAB — CBC
HCT: 38.5 % (ref 36.0–46.0)
Hemoglobin: 12.2 g/dL (ref 12.0–15.0)
MCH: 26.9 pg (ref 26.0–34.0)
MCHC: 31.7 g/dL (ref 30.0–36.0)
MCV: 84.8 fL (ref 78.0–100.0)
PLATELETS: 211 10*3/uL (ref 150–400)
RBC: 4.54 MIL/uL (ref 3.87–5.11)
RDW: 13.2 % (ref 11.5–15.5)
WBC: 6.5 10*3/uL (ref 4.0–10.5)

## 2016-03-17 LAB — BASIC METABOLIC PANEL
Anion gap: 5 (ref 5–15)
BUN: 18 mg/dL (ref 6–20)
CHLORIDE: 105 mmol/L (ref 101–111)
CO2: 30 mmol/L (ref 22–32)
CREATININE: 0.73 mg/dL (ref 0.44–1.00)
Calcium: 9.6 mg/dL (ref 8.9–10.3)
GFR calc Af Amer: >60 mL/min (ref >60)
GFR calc non Af Amer: >60 mL/min (ref >60)
Glucose, Bld: 93 mg/dL (ref 65–99)
Potassium: 5.5 mmol/L — ABNORMAL HIGH (ref 3.5–5.1)
Sodium: 140 mmol/L (ref 135–145)

## 2016-03-17 LAB — SURGICAL PCR SCREEN
MRSA, PCR: NEGATIVE
STAPHYLOCOCCUS AUREUS: NEGATIVE

## 2016-03-17 NOTE — Progress Notes (Signed)
   03/17/16 0932  OBSTRUCTIVE SLEEP APNEA  Have you ever been diagnosed with sleep apnea through a sleep study? No  Do you snore loudly (loud enough to be heard through closed doors)?  0  Do you often feel tired, fatigued, or sleepy during the daytime (such as falling asleep during driving or talking to someone)? 0  Has anyone observed you stop breathing during your sleep? 1  Do you have, or are you being treated for high blood pressure? 1  BMI more than 35 kg/m2? 1  Age > 50 (1-yes) 1  Neck circumference greater than:Female 16 inches or larger, Female 17inches or larger? 1  Female Gender (Yes=1) 0  Obstructive Sleep Apnea Score 5

## 2016-03-17 NOTE — Progress Notes (Signed)
PCP - Dr. Tedra [place] Cardiologist - denies  EKG - 03/17/16 CXR - denies  Echo/Stress Test/Cardiac Cath - denies  Patient denies chest pain and shortness of breath at PAT appointment.

## 2016-03-23 MED ORDER — TRANEXAMIC ACID 1000 MG/10ML IV SOLN
1000.0000 mg | INTRAVENOUS | Status: AC
  Administered 2016-03-24: 1000 mg via INTRAVENOUS
  Filled 2016-03-23: qty 10

## 2016-03-24 ENCOUNTER — Encounter (HOSPITAL_COMMUNITY)
Admission: RE | Disposition: A | Discharge status: Home Health | Payer: Self-pay | Source: Ambulatory Visit | Attending: Orthopaedic Surgery

## 2016-03-24 ENCOUNTER — Inpatient Hospital Stay (HOSPITAL_COMMUNITY): Payer: BLUE CROSS/BLUE SHIELD

## 2016-03-24 ENCOUNTER — Inpatient Hospital Stay (HOSPITAL_COMMUNITY)
Admission: RE | Admit: 2016-03-24 | Discharge: 2016-03-27 | DRG: 470 | Disposition: A | Discharge status: Home Health | Payer: BLUE CROSS/BLUE SHIELD | Source: Ambulatory Visit | Attending: Orthopaedic Surgery | Admitting: Orthopaedic Surgery

## 2016-03-24 ENCOUNTER — Inpatient Hospital Stay (HOSPITAL_COMMUNITY): Payer: BLUE CROSS/BLUE SHIELD | Admitting: Certified Registered"

## 2016-03-24 ENCOUNTER — Encounter (HOSPITAL_COMMUNITY): Payer: Self-pay | Admitting: Surgery

## 2016-03-24 DIAGNOSIS — M1611 Unilateral primary osteoarthritis, right hip: Secondary | ICD-10-CM | POA: Diagnosis not present

## 2016-03-24 DIAGNOSIS — Z471 Aftercare following joint replacement surgery: Secondary | ICD-10-CM | POA: Diagnosis not present

## 2016-03-24 DIAGNOSIS — Z885 Allergy status to narcotic agent status: Secondary | ICD-10-CM | POA: Diagnosis not present

## 2016-03-24 DIAGNOSIS — I10 Essential (primary) hypertension: Secondary | ICD-10-CM | POA: Diagnosis not present

## 2016-03-24 DIAGNOSIS — F329 Major depressive disorder, single episode, unspecified: Secondary | ICD-10-CM | POA: Diagnosis present

## 2016-03-24 DIAGNOSIS — Z79891 Long term (current) use of opiate analgesic: Secondary | ICD-10-CM | POA: Diagnosis not present

## 2016-03-24 DIAGNOSIS — M169 Osteoarthritis of hip, unspecified: Secondary | ICD-10-CM | POA: Diagnosis not present

## 2016-03-24 DIAGNOSIS — M25451 Effusion, right hip: Secondary | ICD-10-CM | POA: Diagnosis present

## 2016-03-24 DIAGNOSIS — Z888 Allergy status to other drugs, medicaments and biological substances status: Secondary | ICD-10-CM | POA: Diagnosis not present

## 2016-03-24 DIAGNOSIS — D62 Acute posthemorrhagic anemia: Secondary | ICD-10-CM | POA: Diagnosis not present

## 2016-03-24 DIAGNOSIS — Z8249 Family history of ischemic heart disease and other diseases of the circulatory system: Secondary | ICD-10-CM

## 2016-03-24 DIAGNOSIS — D869 Sarcoidosis, unspecified: Secondary | ICD-10-CM | POA: Diagnosis not present

## 2016-03-24 DIAGNOSIS — Z87891 Personal history of nicotine dependence: Secondary | ICD-10-CM | POA: Diagnosis not present

## 2016-03-24 DIAGNOSIS — Z79899 Other long term (current) drug therapy: Secondary | ICD-10-CM

## 2016-03-24 DIAGNOSIS — M545 Low back pain: Secondary | ICD-10-CM | POA: Diagnosis not present

## 2016-03-24 DIAGNOSIS — J45909 Unspecified asthma, uncomplicated: Secondary | ICD-10-CM | POA: Diagnosis not present

## 2016-03-24 DIAGNOSIS — Z96641 Presence of right artificial hip joint: Secondary | ICD-10-CM

## 2016-03-24 DIAGNOSIS — Z6837 Body mass index (BMI) 37.0-37.9, adult: Secondary | ICD-10-CM | POA: Diagnosis not present

## 2016-03-24 DIAGNOSIS — Z419 Encounter for procedure for purposes other than remedying health state, unspecified: Secondary | ICD-10-CM

## 2016-03-24 DIAGNOSIS — M4806 Spinal stenosis, lumbar region: Secondary | ICD-10-CM | POA: Diagnosis present

## 2016-03-24 HISTORY — DX: Nausea with vomiting, unspecified: R11.2

## 2016-03-24 HISTORY — PX: TOTAL HIP ARTHROPLASTY: SHX124

## 2016-03-24 HISTORY — DX: Other specified postprocedural states: Z98.890

## 2016-03-24 SURGERY — ARTHROPLASTY, HIP, TOTAL, ANTERIOR APPROACH
Anesthesia: Monitor Anesthesia Care | Site: Hip | Laterality: Right

## 2016-03-24 MED ORDER — KETOROLAC TROMETHAMINE 15 MG/ML IJ SOLN
INTRAMUSCULAR | Status: AC
  Filled 2016-03-24: qty 1

## 2016-03-24 MED ORDER — OXYCODONE HCL 5 MG/5ML PO SOLN
5.0000 mg | Freq: Once | ORAL | Status: AC | PRN
Start: 2016-03-24 — End: 2016-03-24

## 2016-03-24 MED ORDER — BUPIVACAINE HCL (PF) 0.5 % IJ SOLN
INTRAMUSCULAR | Status: DC | PRN
  Administered 2016-03-24: 3 mL via INTRATHECAL

## 2016-03-24 MED ORDER — ASPIRIN EC 325 MG PO TBEC
325.0000 mg | DELAYED_RELEASE_TABLET | Freq: Two times a day (BID) | ORAL | Status: DC
Start: 2016-03-25 — End: 2016-03-27
  Administered 2016-03-25 – 2016-03-27 (×5): 325 mg via ORAL
  Filled 2016-03-24 (×5): qty 1

## 2016-03-24 MED ORDER — METHOCARBAMOL 1000 MG/10ML IJ SOLN
500.0000 mg | Freq: Four times a day (QID) | INTRAVENOUS | Status: DC | PRN
  Filled 2016-03-24: qty 5

## 2016-03-24 MED ORDER — MENTHOL 3 MG MT LOZG: 1.0000 | LOZENGE | OROMUCOSAL | Status: DC | PRN

## 2016-03-24 MED ORDER — SODIUM CHLORIDE 0.9 % IR SOLN
Status: DC | PRN
  Administered 2016-03-24: 3000 mL

## 2016-03-24 MED ORDER — POLYETHYLENE GLYCOL 3350 17 G PO PACK
17.0000 g | PACK | Freq: Every day | ORAL | Status: DC | PRN
  Administered 2016-03-26 – 2016-03-27 (×2): 17 g via ORAL
  Filled 2016-03-24 (×2): qty 1

## 2016-03-24 MED ORDER — ONDANSETRON HCL 4 MG/2ML IJ SOLN
4.0000 mg | Freq: Four times a day (QID) | INTRAMUSCULAR | Status: DC | PRN
  Filled 2016-03-24: qty 2

## 2016-03-24 MED ORDER — FENTANYL CITRATE (PF) 250 MCG/5ML IJ SOLN
INTRAMUSCULAR | Status: AC
  Filled 2016-03-24: qty 5

## 2016-03-24 MED ORDER — HYDROMORPHONE HCL 1 MG/ML IJ SOLN
INTRAMUSCULAR | Status: AC
  Filled 2016-03-24: qty 1

## 2016-03-24 MED ORDER — ALBUTEROL SULFATE (2.5 MG/3ML) 0.083% IN NEBU: 3.0000 mL | INHALATION_SOLUTION | Freq: Four times a day (QID) | RESPIRATORY_TRACT | Status: DC | PRN

## 2016-03-24 MED ORDER — OXYCODONE HCL 5 MG PO TABS
5.0000 mg | ORAL_TABLET | ORAL | Status: DC | PRN
  Administered 2016-03-24 – 2016-03-25 (×2): 5 mg via ORAL
  Administered 2016-03-25: 10 mg via ORAL
  Administered 2016-03-25: 5 mg via ORAL
  Administered 2016-03-25 – 2016-03-26 (×3): 10 mg via ORAL
  Administered 2016-03-26 (×2): 5 mg via ORAL
  Administered 2016-03-26: 10 mg via ORAL
  Administered 2016-03-26 – 2016-03-27 (×3): 5 mg via ORAL
  Filled 2016-03-24: qty 2
  Filled 2016-03-24: qty 1
  Filled 2016-03-24: qty 2
  Filled 2016-03-24: qty 3
  Filled 2016-03-24: qty 1
  Filled 2016-03-24: qty 3
  Filled 2016-03-24: qty 1
  Filled 2016-03-24: qty 2
  Filled 2016-03-24 (×3): qty 1
  Filled 2016-03-24: qty 2
  Filled 2016-03-24: qty 1

## 2016-03-24 MED ORDER — LOSARTAN POTASSIUM 50 MG PO TABS
50.0000 mg | ORAL_TABLET | Freq: Every day | ORAL | Status: DC
  Administered 2016-03-25 – 2016-03-27 (×3): 50 mg via ORAL
  Filled 2016-03-24 (×3): qty 1

## 2016-03-24 MED ORDER — ZOLPIDEM TARTRATE 5 MG PO TABS: 5.0000 mg | ORAL_TABLET | Freq: Every evening | ORAL | Status: DC | PRN

## 2016-03-24 MED ORDER — METHOCARBAMOL 500 MG PO TABS
ORAL_TABLET | ORAL | Status: AC
  Filled 2016-03-24: qty 1

## 2016-03-24 MED ORDER — ACETAMINOPHEN 325 MG PO TABS: 325.0000 mg | ORAL_TABLET | ORAL | Status: DC | PRN

## 2016-03-24 MED ORDER — FENTANYL CITRATE (PF) 100 MCG/2ML IJ SOLN
INTRAMUSCULAR | Status: DC | PRN
  Administered 2016-03-24 (×3): 50 ug via INTRAVENOUS

## 2016-03-24 MED ORDER — ACETAMINOPHEN 650 MG RE SUPP: 650.0000 mg | Freq: Four times a day (QID) | RECTAL | Status: DC | PRN

## 2016-03-24 MED ORDER — METOCLOPRAMIDE HCL 5 MG/ML IJ SOLN
5.0000 mg | Freq: Three times a day (TID) | INTRAMUSCULAR | Status: DC | PRN
  Administered 2016-03-24: 10 mg via INTRAVENOUS

## 2016-03-24 MED ORDER — PHENOL 1.4 % MT LIQD
1.0000 | OROMUCOSAL | Status: DC | PRN
Start: 2016-03-24 — End: 2016-03-27

## 2016-03-24 MED ORDER — VITAMIN D 1000 UNITS PO TABS
1000.0000 [IU] | ORAL_TABLET | Freq: Every day | ORAL | Status: DC
  Administered 2016-03-24 – 2016-03-27 (×4): 1000 [IU] via ORAL
  Filled 2016-03-24 (×7): qty 1

## 2016-03-24 MED ORDER — 0.9 % SODIUM CHLORIDE (POUR BTL) OPTIME
TOPICAL | Status: DC | PRN
  Administered 2016-03-24: 1000 mL

## 2016-03-24 MED ORDER — PROPOFOL 10 MG/ML IV BOLUS
INTRAVENOUS | Status: AC
  Filled 2016-03-24: qty 20

## 2016-03-24 MED ORDER — CEFAZOLIN SODIUM-DEXTROSE 2-4 GM/100ML-% IV SOLN
2.0000 g | INTRAVENOUS | Status: AC
  Administered 2016-03-24: 2 g via INTRAVENOUS

## 2016-03-24 MED ORDER — PROPOFOL 500 MG/50ML IV EMUL
INTRAVENOUS | Status: DC | PRN
  Administered 2016-03-24: 50 ug/kg/min via INTRAVENOUS

## 2016-03-24 MED ORDER — KETOROLAC TROMETHAMINE 15 MG/ML IJ SOLN
7.5000 mg | Freq: Four times a day (QID) | INTRAMUSCULAR | Status: AC
  Administered 2016-03-24 – 2016-03-25 (×4): 7.5 mg via INTRAVENOUS
  Filled 2016-03-24 (×2): qty 1

## 2016-03-24 MED ORDER — BUPROPION HCL ER (SR) 150 MG PO TB12
150.0000 mg | ORAL_TABLET | Freq: Two times a day (BID) | ORAL | Status: DC
  Administered 2016-03-24 – 2016-03-27 (×6): 150 mg via ORAL
  Filled 2016-03-24 (×6): qty 1

## 2016-03-24 MED ORDER — ACETAMINOPHEN 325 MG PO TABS
650.0000 mg | ORAL_TABLET | Freq: Four times a day (QID) | ORAL | Status: DC | PRN
  Administered 2016-03-26 – 2016-03-27 (×4): 650 mg via ORAL
  Filled 2016-03-24 (×4): qty 2

## 2016-03-24 MED ORDER — METHOCARBAMOL 500 MG PO TABS
500.0000 mg | ORAL_TABLET | Freq: Four times a day (QID) | ORAL | Status: DC | PRN
  Administered 2016-03-24 – 2016-03-26 (×8): 500 mg via ORAL
  Filled 2016-03-24 (×7): qty 1

## 2016-03-24 MED ORDER — MIDAZOLAM HCL 5 MG/5ML IJ SOLN
INTRAMUSCULAR | Status: DC | PRN
  Administered 2016-03-24: 2 mg via INTRAVENOUS

## 2016-03-24 MED ORDER — ACETAMINOPHEN 160 MG/5ML PO SOLN
325.0000 mg | ORAL | Status: DC | PRN
  Filled 2016-03-24: qty 20.3

## 2016-03-24 MED ORDER — LIDOCAINE 2% (20 MG/ML) 5 ML SYRINGE
INTRAMUSCULAR | Status: AC
  Filled 2016-03-24: qty 5

## 2016-03-24 MED ORDER — ONDANSETRON HCL 4 MG PO TABS: 4.0000 mg | ORAL_TABLET | Freq: Four times a day (QID) | ORAL | Status: DC | PRN

## 2016-03-24 MED ORDER — CEFAZOLIN SODIUM-DEXTROSE 2-4 GM/100ML-% IV SOLN
INTRAVENOUS | Status: AC
  Filled 2016-03-24: qty 100

## 2016-03-24 MED ORDER — OXYCODONE HCL 5 MG PO TABS
5.0000 mg | ORAL_TABLET | Freq: Once | ORAL | Status: AC | PRN
  Administered 2016-03-24: 5 mg via ORAL

## 2016-03-24 MED ORDER — OXYCODONE HCL 5 MG PO TABS
ORAL_TABLET | ORAL | Status: AC
  Filled 2016-03-24: qty 1

## 2016-03-24 MED ORDER — SODIUM CHLORIDE 0.9 % IV SOLN
INTRAVENOUS | Status: DC
Start: 2016-03-24 — End: 2016-03-26
  Administered 2016-03-24: 18:00:00 via INTRAVENOUS

## 2016-03-24 MED ORDER — METOCLOPRAMIDE HCL 5 MG/ML IJ SOLN
INTRAMUSCULAR | Status: AC
  Filled 2016-03-24: qty 2

## 2016-03-24 MED ORDER — DOCUSATE SODIUM 100 MG PO CAPS
100.0000 mg | ORAL_CAPSULE | Freq: Two times a day (BID) | ORAL | Status: DC
  Administered 2016-03-24 – 2016-03-27 (×6): 100 mg via ORAL
  Filled 2016-03-24 (×6): qty 1

## 2016-03-24 MED ORDER — DIPHENHYDRAMINE HCL 12.5 MG/5ML PO ELIX: 12.5000 mg | ORAL_SOLUTION | ORAL | Status: DC | PRN

## 2016-03-24 MED ORDER — NALTREXONE-BUPROPION HCL ER 8-90 MG PO TB12: 1.0000 | ORAL_TABLET | Freq: Every day | ORAL | Status: DC

## 2016-03-24 MED ORDER — LACTATED RINGERS IV SOLN
INTRAVENOUS | Status: DC
  Administered 2016-03-24 (×2): via INTRAVENOUS

## 2016-03-24 MED ORDER — HYDROMORPHONE HCL 1 MG/ML IJ SOLN: 1.0000 mg | INTRAMUSCULAR | Status: DC | PRN

## 2016-03-24 MED ORDER — ALUM & MAG HYDROXIDE-SIMETH 200-200-20 MG/5ML PO SUSP: 30.0000 mL | ORAL | Status: DC | PRN

## 2016-03-24 MED ORDER — MIDAZOLAM HCL 2 MG/2ML IJ SOLN
INTRAMUSCULAR | Status: AC
  Filled 2016-03-24: qty 2

## 2016-03-24 MED ORDER — CHLORHEXIDINE GLUCONATE 4 % EX LIQD: 60.0000 mL | Freq: Once | CUTANEOUS | Status: DC

## 2016-03-24 MED ORDER — CEFAZOLIN IN D5W 1 GM/50ML IV SOLN
1.0000 g | Freq: Four times a day (QID) | INTRAVENOUS | Status: AC
  Administered 2016-03-24 – 2016-03-25 (×2): 1 g via INTRAVENOUS
  Filled 2016-03-24 (×2): qty 50

## 2016-03-24 MED ORDER — METOCLOPRAMIDE HCL 5 MG PO TABS: 5.0000 mg | ORAL_TABLET | Freq: Three times a day (TID) | ORAL | Status: DC | PRN

## 2016-03-24 MED ORDER — HYDROMORPHONE HCL 1 MG/ML IJ SOLN
0.2500 mg | INTRAMUSCULAR | Status: DC | PRN
  Administered 2016-03-24: 1 mg via INTRAVENOUS

## 2016-03-24 SURGICAL SUPPLY — 52 items
APL SKNCLS STERI-STRIP NONHPOA (GAUZE/BANDAGES/DRESSINGS) ×1
BENZOIN TINCTURE PRP APPL 2/3 (GAUZE/BANDAGES/DRESSINGS) ×2 IMPLANT
BLADE SAW SGTL 18X1.27X75 (BLADE) ×2 IMPLANT
BLADE SURG ROTATE 9660 (MISCELLANEOUS) IMPLANT
CAPT HIP TOTAL 2 ×2 IMPLANT
CELLS DAT CNTRL 66122 CELL SVR (MISCELLANEOUS) ×1 IMPLANT
COVER SURGICAL LIGHT HANDLE (MISCELLANEOUS) ×2 IMPLANT
DRAPE C-ARM 42X72 X-RAY (DRAPES) ×2 IMPLANT
DRAPE STERI IOBAN 125X83 (DRAPES) ×2 IMPLANT
DRAPE U-SHAPE 47X51 STRL (DRAPES) ×8 IMPLANT
DRSG AQUACEL AG ADV 3.5X10 (GAUZE/BANDAGES/DRESSINGS) ×2 IMPLANT
DURAPREP 26ML APPLICATOR (WOUND CARE) ×2 IMPLANT
ELECT BLADE 4.0 EZ CLEAN MEGAD (MISCELLANEOUS) ×2
ELECT BLADE 6.5 EXT (BLADE) IMPLANT
ELECT REM PT RETURN 9FT ADLT (ELECTROSURGICAL) ×2
ELECTRODE BLDE 4.0 EZ CLN MEGD (MISCELLANEOUS) ×1 IMPLANT
ELECTRODE REM PT RTRN 9FT ADLT (ELECTROSURGICAL) ×1 IMPLANT
FACESHIELD WRAPAROUND (MASK) ×4 IMPLANT
FACESHIELD WRAPAROUND OR TEAM (MASK) ×2 IMPLANT
GAUZE XEROFORM 1X8 LF (GAUZE/BANDAGES/DRESSINGS) ×1 IMPLANT
GLOVE BIOGEL PI IND STRL 8 (GLOVE) ×1 IMPLANT
GLOVE BIOGEL PI INDICATOR 8 (GLOVE) ×1
GLOVE ECLIPSE 8.0 STRL XLNG CF (GLOVE) IMPLANT
GLOVE ORTHO TXT STRL SZ7.5 (GLOVE) ×4 IMPLANT
GOWN STRL REUS W/ TWL LRG LVL3 (GOWN DISPOSABLE) ×2 IMPLANT
GOWN STRL REUS W/ TWL XL LVL3 (GOWN DISPOSABLE) ×2 IMPLANT
GOWN STRL REUS W/TWL LRG LVL3 (GOWN DISPOSABLE) ×4
GOWN STRL REUS W/TWL XL LVL3 (GOWN DISPOSABLE) ×4
HANDPIECE INTERPULSE COAX TIP (DISPOSABLE) ×2
KIT BASIN OR (CUSTOM PROCEDURE TRAY) ×2 IMPLANT
KIT ROOM TURNOVER OR (KITS) ×2 IMPLANT
MANIFOLD NEPTUNE II (INSTRUMENTS) ×2 IMPLANT
NS IRRIG 1000ML POUR BTL (IV SOLUTION) ×2 IMPLANT
PACK TOTAL JOINT (CUSTOM PROCEDURE TRAY) ×2 IMPLANT
PAD ARMBOARD 7.5X6 YLW CONV (MISCELLANEOUS) ×2 IMPLANT
RETRACTOR WND ALEXIS 18 MED (MISCELLANEOUS) ×1 IMPLANT
RTRCTR WOUND ALEXIS 18CM MED (MISCELLANEOUS) ×2
SET HNDPC FAN SPRY TIP SCT (DISPOSABLE) ×1 IMPLANT
STAPLER VISISTAT 35W (STAPLE) ×1 IMPLANT
STRIP CLOSURE SKIN 1/2X4 (GAUZE/BANDAGES/DRESSINGS) ×4 IMPLANT
SUT ETHIBOND NAB CT1 #1 30IN (SUTURE) ×2 IMPLANT
SUT MNCRL AB 4-0 PS2 18 (SUTURE) IMPLANT
SUT VIC AB 0 CT1 27 (SUTURE) ×2
SUT VIC AB 0 CT1 27XBRD ANBCTR (SUTURE) ×1 IMPLANT
SUT VIC AB 1 CT1 27 (SUTURE) ×2
SUT VIC AB 1 CT1 27XBRD ANBCTR (SUTURE) ×1 IMPLANT
SUT VIC AB 2-0 CT1 27 (SUTURE) ×2
SUT VIC AB 2-0 CT1 TAPERPNT 27 (SUTURE) ×1 IMPLANT
TOWEL OR 17X24 6PK STRL BLUE (TOWEL DISPOSABLE) ×2 IMPLANT
TOWEL OR 17X26 10 PK STRL BLUE (TOWEL DISPOSABLE) ×2 IMPLANT
TRAY FOLEY CATH 16FRSI W/METER (SET/KITS/TRAYS/PACK) ×1 IMPLANT
WATER STERILE IRR 1000ML POUR (IV SOLUTION) ×4 IMPLANT

## 2016-03-24 NOTE — Op Note (Signed)
[name], Andrade             ACCOUNT NO.:  [id number]  MEDICAL RECORD NO.:  [id number]  LOCATION:  [facility]                         FACILITY:  [facility]  PHYSICIAN:  Vanita Panda. Magnus Ivan, M.D.DATE OF BIRTH:  10-Oct-1955  DATE OF PROCEDURE:  03/24/2016 DATE OF DISCHARGE:                              OPERATIVE REPORT   PREOPERATIVE DIAGNOSIS:  Severe osteoarthritis and degenerative joint disease of right hip.  POSTOPERATIVE DIAGNOSIS:  Severe osteoarthritis and degenerative joint disease of right hip.  PROCEDURE:  Right total hip arthroplasty through direct anterior approach.  IMPLANTS:  DePuy Sector Gription acetabular component size 52, size 36+ 0 neutral polyethylene liner, size 12 Corail femoral component with standard offset, size 36+ 1.5 ceramic hip ball.  SURGEON:  Vanita Panda. Magnus Ivan, M.D.  ASSISTANT:  Patrick Jupiter, RNFA.  ANESTHESIA:  Spinal.  ANTIBIOTICS:  2 g of IV Ancef.  BLOOD LOSS:  300 mL.  COMPLICATIONS:  None.  INDICATIONS:  [name] is a 60 year old female, well known to me.  She has known, well-documented debilitating osteoarthritis and degenerative joint disease involving her right hip.  She has tried and failed all forms of conservative treatment.  Her pain is daily, it is 10/10 and is detrimentally affected her activities of daily living, her quality of life and her mobility.  At this point, she wished to proceed with a total hip arthroplasty.  We talked about prognosis through the direct anterior approach.  She understands fully the risks of acute blood loss anemia, nerve and vessel injury, fracture, DVT and dislocation.  She understands our goals are decreased pain, improved mobility and overall improved quality of life.  PROCEDURE DESCRIPTION:  After informed consent was obtained, appropriate right leg was marked.  She was brought to the operating room and spinal anesthesia was obtained while she was on her stretcher.  A Foley catheter  was placed and both feet had traction boots applied to them. Next, she was placed supine on the Hana fracture table with the perineal post in place and both legs in inline skeletal traction devices, but no traction applied.  Her right operative hip was then prepped and draped with DuraPrep and sterile drapes.  A time-out was called and she was identified as correct patient and correct right hip.  We then made our incision inferior and posterior to the anterior superior iliac spine and carried this obliquely down the leg.  We dissected down the tensor fascia lata muscle and tensor fascia was then divided longitudinally, so we could proceed with a direct anterior approach to the hip.  We identified and cauterized the circumflex vessels and then identified the hip capsule to open up the hip capsule in an L-type format, finding significant periarticular osteophytes and moderate joint effusion.  We placed our Cobra retractors within joint capsule and made our femoral neck cut with an oscillating saw proximal to the lesser trochanter and completed this with an osteotome.  We placed a corkscrew guide in the femoral head and removed the femoral head in its entirety and found it to be completely devoid of cartilage.  We then placed a bent Hohmann over the medial acetabular rim and externally rotated the leg little bit.  I  removed the remnants of the acetabular labrum.  I then began reaming from direct visualization from a size 48 reamer up to the size 52 and with the 52, we also put our direct visualization and direct fluoroscopy so we could obtain our depth of reaming, our inclination and anteversion.  Once we were pleased with this, we placed the real DePuy Sector Gription acetabular component size 52 and a 36+ 0 neutral polyethylene liner for that size acetabular component.  Attention was then turned to the femur.  With the leg externally rotated to 120 degrees, extended and adducted, we were  able to place a Mueller retractor medially and a Hohmann retractor behind the greater trochanter.  I released the lateral joint capsule and used a box cutting osteotome to enter the canal and a rongeur to lateralize.  We then began broaching from a size 8 broach going all the way up to the size 12. With the size 12 in place, we trialed a standard offset femoral neck and a 36+ 1.5 hip ball.  We brought the leg back over and up with traction and internal rotation, reducing the pelvis.  We were pleased with leg length, offset, stability and range of motion.  We then dislocated the hip and removed the trial components.  We were able to place the real size 12 Corail femoral component with standard offset and the real 36+ 1.5 ceramic hip ball and again reduced this in the pelvis and we were pleased with stability.  We then irrigated the soft tissue with normal saline solution using pulsatile lavage.  We closed the joint capsule with interrupted #1 Ethibond suture followed by running #1 Vicryl in the tensor fascia, 0 Vicryl in the deep tissue, 2-0 Vicryl in the subcutaneous tissue and interrupted staples on the skin.  Xeroform and a well-padded Aquacel dressing were applied.  She was taken off the Hana table, taken to the recovery room in stable condition.  All final counts were correct.  There were no complications noted.     Vanita Panda. Magnus Ivan, M.D.     CYB/MEDQ  D:  03/24/2016  T:  03/24/2016  Job:  [id number]

## 2016-03-24 NOTE — Anesthesia Procedure Notes (Signed)
Procedure Name: MAC Date/Time: 03/24/2016 3:15 PM Performed by: Babs Bertin Pre-anesthesia Checklist: Patient identified, Emergency Drugs available, Suction available, Patient being monitored and Timeout performed Patient Re-evaluated:Patient Re-evaluated prior to inductionOxygen Delivery Method: Simple face mask

## 2016-03-24 NOTE — H&P (Signed)
TOTAL HIP ADMISSION H&P  Patient is admitted for right total hip arthroplasty.  Subjective:  Chief Complaint: right hip pain  HPI: Stephanie Andrade, 60 y.o. female, has a history of pain and functional disability in the right hip(s) due to arthritis and patient has failed non-surgical conservative treatments for greater than 12 weeks to include NSAID's and/or analgesics, corticosteriod injections, flexibility and strengthening excercises, weight reduction as appropriate and activity modification.  Onset of symptoms was gradual starting 3 years ago with gradually worsening course since that time.The patient noted no past surgery on the right hip(s).  Patient currently rates pain in the right hip at 10 out of 10 with activity. Patient has night pain, worsening of pain with activity and weight bearing, trendelenberg gait, pain that interfers with activities of daily living and pain with passive range of motion. Patient has evidence of subchondral cysts, subchondral sclerosis, periarticular osteophytes and joint space narrowing by imaging studies. This condition presents safety issues increasing the risk of falls.  There is no current active infection.  Patient Active Problem List   Diagnosis Date Noted  . Osteoarthritis of left hip 11/26/2015  . Impaired glucose tolerance 12/09/2014  . Plantar fascia rupture 06/16/2012  . History of vitamin D deficiency 11/15/2011  . History of sarcoidosis 11/15/2011  . Hot flashes not due to menopause 07/19/2011  . Spinal stenosis, lumbar region, with neurogenic claudication 03/10/2011  . Hypertension 02/11/2011  . Asthma 02/11/2011  . Morbid obesity ([place]) 02/11/2011  . Depression 02/11/2011  . Cervical radiculopathy 12/04/2010  . SHOULDER PAIN, BILATERAL 10/01/2010  . WRIST PAIN, BILATERAL 10/01/2010  . LUMBAGO 10/01/2010  . Osteoarthritis of right hip 07/17/2009  . GREATER TROCHANTERIC BURSITIS 07/17/2009   Past Medical History:  Diagnosis Date  .  Arthritis   . Asthma   . Depression   . Family history of adverse reaction to anesthesia    sister, brother - PONV  . Family history of ovarian cancer   . Fibroids   . History of anemia   . History of bronchitis   . History of pneumonia   . History of sarcoidosis   . Hypertension   . Wears glasses     Past Surgical History:  Procedure Laterality Date  . COLONOSCOPY    . ESOPHAGOGASTRODUODENOSCOPY      Prescriptions Prior to Admission  Medication Sig Dispense Refill Last Dose  . acetaminophen (TYLENOL) 500 MG tablet Take 1,000 mg by mouth 3 (three) times daily.   03/24/2016 at 0700  . buPROPion (WELLBUTRIN SR) 150 MG 12 hr tablet Take 150 mg by mouth 2 (two) times daily.   03/24/2016 at 0700  . cholecalciferol (VITAMIN D) 1000 units tablet Take 1,000 Units by mouth daily.   Past Week at Unknown time  . CONTRAVE 8-90 MG TB12 Take 1 tab twice daily (Patient taking differently: Take 1 tablet by mouth daily. Take 1 tab twice daily) 60 tablet 2 Past Week at Unknown time  . losartan (COZAAR) 50 MG tablet Take 1 tablet (50 mg total) by mouth daily. 90 tablet 3 03/24/2016 at 0700  . meloxicam (MOBIC) 15 MG tablet Take 1 tablet (15 mg total) by mouth daily. 30 tablet 4 Past Week at Unknown time  . Omega-3 Fatty Acids (FISH OIL) 1000 MG CAPS Take 1,000 mg by mouth daily.   Past Week at Unknown time  . oxyCODONE-acetaminophen (PERCOCET/ROXICET) 5-325 MG tablet Take 1 tablet by mouth every 8 (eight) hours as needed for severe pain.  03/23/2016 at Unknown time  . Turmeric 500 MG TABS Take 500 mg by mouth daily.   Past Week at Unknown time  . zolpidem (AMBIEN) 5 MG tablet TAKE 1 TABLET AT BEDTIME AS NEEDED FOR SLEEP. 30 tablet 2 03/23/2016 at Unknown time  . albuterol (PROVENTIL HFA;VENTOLIN HFA) 108 (90 Base) MCG/ACT inhaler Inhale 2 puffs into the lungs every 6 (six) hours as needed for wheezing or shortness of breath. 1 Inhaler 2 More than a month at Unknown time  . diazepam (VALIUM) 5 MG tablet For  muscle spasm (Patient not taking: Reported on 03/16/2016) 30 tablet 2 Not Taking at Unknown time   Allergies  Allergen Reactions  . Mushroom Extract Complex Anaphylaxis and Hives  . Codeine Nausea Only  . Dextromethorphan   . Levaquin [Levofloxacin In D5w]     Myalgias  . Tessalon Perles     Social History  Substance Use Topics  . Smoking status: Former Smoker    Types: Cigarettes    Quit date: 06/20/2009  . Smokeless tobacco: Never Used     Comment: "smoked 2 weeks out of the year for about 15 years"   . Alcohol use Yes     Comment: socially    Family History  Problem Relation Age of Onset  . Cancer Mother   . Arthritis Father   . Hypertension Father   . Arthritis Sister   . Hypertension Brother      Review of Systems  Musculoskeletal: Positive for joint pain.  All other systems reviewed and are negative.   Objective:  Physical Exam  Constitutional: She is oriented to person, place, and time. She appears well-developed and well-nourished.  HENT:  Head: Normocephalic and atraumatic.  Eyes: EOM are normal. Pupils are equal, round, and reactive to light.  Neck: Normal range of motion. Neck supple.  Cardiovascular: Normal rate and regular rhythm.   Respiratory: Effort normal and breath sounds normal.  GI: Soft. Bowel sounds are normal.  Musculoskeletal:       Right hip: She exhibits decreased range of motion, decreased strength, tenderness and bony tenderness.  Neurological: She is alert and oriented to person, place, and time.  Skin: Skin is warm and dry.  Psychiatric: She has a normal mood and affect.    Vital signs in last 24 hours: Temp:  [98.5 F (36.9 C)] 98.5 F (36.9 C) (08/08 1147) Pulse Rate:  [79] 79 (08/08 1147) Resp:  [18] 18 (08/08 1147) BP: (127)/(58) 127/58 (08/08 1147) SpO2:  [99 %] 99 % (08/08 1147)  Labs:   Estimated body mass index is 37.21 kg/m as calculated from the following:   Height as of 03/17/16: 5\' 8"  (1.727 m).   Weight as of  03/17/16: 111 kg (244 lb 11.2 oz).   Imaging Review Plain radiographs demonstrate severe degenerative joint disease of the right hip(s). The bone quality appears to be good for age and reported activity level.  Assessment/Plan:  End stage arthritis, right hip(s)  The patient history, physical examination, clinical judgement of the provider and imaging studies are consistent with end stage degenerative joint disease of the right hip(s) and total hip arthroplasty is deemed medically necessary. The treatment options including medical management, injection therapy, arthroscopy and arthroplasty were discussed at length. The risks and benefits of total hip arthroplasty were presented and reviewed. The risks due to aseptic loosening, infection, stiffness, dislocation/subluxation,  thromboembolic complications and other imponderables were discussed.  The patient acknowledged the explanation, agreed to proceed with the plan  and consent was signed. Patient is being admitted for inpatient treatment for surgery, pain control, PT, OT, prophylactic antibiotics, VTE prophylaxis, progressive ambulation and ADL's and discharge planning.The patient is planning to be discharged home with home health services

## 2016-03-24 NOTE — Anesthesia Procedure Notes (Signed)
Spinal Patient location during procedure: OR Staffing Anesthesiologist: Shayan Bramhall Preanesthetic Checklist Completed: patient identified, surgical consent, pre-op evaluation, timeout performed, IV checked, risks and benefits discussed and monitors and equipment checked Spinal Block Patient position: sitting Prep: site prepped and draped and DuraPrep Patient monitoring: heart rate, cardiac monitor, continuous pulse ox and blood pressure Approach: midline Location: L3-4 Injection technique: single-shot Needle Needle type: Pencan  Needle gauge: 24 G Needle length: 10 cm Assessment Sensory level: T6   

## 2016-03-24 NOTE — Anesthesia Preprocedure Evaluation (Addendum)
Anesthesia Evaluation  Patient identified by MRN, date of birth, ID band Patient awake    Reviewed: Allergy & Precautions, NPO status , Patient's Chart, lab work & pertinent test results  History of Anesthesia Complications Negative for: history of anesthetic complications  Airway Mallampati: II  TM Distance: >3 FB Neck ROM: Full    Dental  (+) Teeth Intact   Pulmonary asthma , former smoker,    breath sounds clear to auscultation       Cardiovascular hypertension, + Peripheral Vascular Disease   Rhythm:Regular     Neuro/Psych PSYCHIATRIC DISORDERS Depression  Neuromuscular disease    GI/Hepatic negative GI ROS, Neg liver ROS,   Endo/Other  Morbid obesity  Renal/GU negative Renal ROS     Musculoskeletal  (+) Arthritis ,   Abdominal   Peds  Hematology negative hematology ROS (+)   Anesthesia Other Findings   Reproductive/Obstetrics                           Anesthesia Physical Anesthesia Plan  ASA: III  Anesthesia Plan: Spinal and MAC   Post-op Pain Management:    Induction: Intravenous  Airway Management Planned: Nasal Cannula, Natural Airway and Simple Face Mask  Additional Equipment: None  Intra-op Plan:   Post-operative Plan:   Informed Consent: I have reviewed the patients History and Physical, chart, labs and discussed the procedure including the risks, benefits and alternatives for the proposed anesthesia with the patient or authorized representative who has indicated his/her understanding and acceptance.   Dental advisory given  Plan Discussed with: CRNA and Surgeon  Anesthesia Plan Comments:        Anesthesia Quick Evaluation

## 2016-03-24 NOTE — Progress Notes (Signed)
Care of pt assumed by MA Lovelee Forner RN 

## 2016-03-24 NOTE — Transfer of Care (Signed)
Immediate Anesthesia Transfer of Care Note  Patient: Stephanie Andrade  Procedure(s) Performed: Procedure(s): RIGHT TOTAL HIP ARTHROPLASTY ANTERIOR APPROACH (Right)  Patient Location: PACU  Anesthesia Type:MAC and Spinal  Level of Consciousness: awake, alert  and oriented  Airway & Oxygen Therapy: Patient Spontanous Breathing  Post-op Assessment: Report given to RN and Post -op Vital signs reviewed and stable  Post vital signs: Reviewed and stable  Last Vitals:  Vitals:   03/24/16 1147  BP: (!) 127/58  Pulse: 79  Resp: 18  Temp: 36.9 C    Last Pain:  Vitals:   03/24/16 1147  TempSrc: Oral      Patients Stated Pain Goal: 3 ([id number])  Complications: No apparent anesthesia complications

## 2016-03-24 NOTE — Brief Op Note (Signed)
03/24/2016  4:47 PM  PATIENT:  Stephanie Andrade  60 y.o. female  PRE-OPERATIVE DIAGNOSIS:  severe endstage arthritis right hip  POST-OPERATIVE DIAGNOSIS:  severe endstage arthritis right hip  PROCEDURE:  Procedure(s): RIGHT TOTAL HIP ARTHROPLASTY ANTERIOR APPROACH (Right)  SURGEON:  Surgeon(s) and Role:    * Mcarthur Rossetti, MD - Primary  ASSISTANTS: Laure Kidney, RNFA   ANESTHESIA:   spinal  EBL:  Total I/O In: 1100 [I.V.:1100] Out: 600 [Urine:300; Blood:300]  COUNTS:  YES  DICTATION: .Other Dictation: Dictation Number [id number]  PLAN OF CARE: Admit to inpatient   PATIENT DISPOSITION:  PACU - hemodynamically stable.   Delay start of Pharmacological VTE agent (>24hrs) due to surgical blood loss or risk of bleeding: no

## 2016-03-24 NOTE — Anesthesia Postprocedure Evaluation (Signed)
Anesthesia Post Note  Patient: Stephanie Andrade  Procedure(s) Performed: Procedure(s) (LRB): RIGHT TOTAL HIP ARTHROPLASTY ANTERIOR APPROACH (Right)  Patient location during evaluation: PACU Anesthesia Type: Spinal and MAC Level of consciousness: oriented and awake and alert Pain management: pain level controlled Vital Signs Assessment: post-procedure vital signs reviewed and stable Respiratory status: spontaneous breathing, respiratory function stable and patient connected to nasal cannula oxygen Cardiovascular status: blood pressure returned to baseline and stable Postop Assessment: no headache, no backache, spinal receding, no signs of nausea or vomiting and patient able to bend at knees Anesthetic complications: no    Last Vitals:  Vitals:   03/24/16 1930 03/24/16 2000  BP:    Pulse: 68 61  Resp: 16 14  Temp:      Last Pain:  Vitals:   03/24/16 2000  TempSrc:   PainSc: 3                  Catalina Gravel

## 2016-03-25 ENCOUNTER — Encounter (HOSPITAL_COMMUNITY): Payer: Self-pay | Admitting: Orthopaedic Surgery

## 2016-03-25 LAB — CBC
HCT: 29.4 % — ABNORMAL LOW (ref 36.0–46.0)
Hemoglobin: 9.4 g/dL — ABNORMAL LOW (ref 12.0–15.0)
MCH: 26.7 pg (ref 26.0–34.0)
MCHC: 32 g/dL (ref 30.0–36.0)
MCV: 83.5 fL (ref 78.0–100.0)
PLATELETS: 162 10*3/uL (ref 150–400)
RBC: 3.52 MIL/uL — ABNORMAL LOW (ref 3.87–5.11)
RDW: 13.1 % (ref 11.5–15.5)
WBC: 6.8 10*3/uL (ref 4.0–10.5)

## 2016-03-25 LAB — BASIC METABOLIC PANEL
Anion gap: 6 (ref 5–15)
BUN: 17 mg/dL (ref 6–20)
CALCIUM: 8.6 mg/dL — AB (ref 8.9–10.3)
CHLORIDE: 103 mmol/L (ref 101–111)
CO2: 31 mmol/L (ref 22–32)
CREATININE: 0.6 mg/dL (ref 0.44–1.00)
GFR calc non Af Amer: >60 mL/min (ref >60)
Glucose, Bld: 101 mg/dL — ABNORMAL HIGH (ref 65–99)
Potassium: 4.4 mmol/L (ref 3.5–5.1)
SODIUM: 140 mmol/L (ref 135–145)

## 2016-03-25 NOTE — Progress Notes (Signed)
Subjective: 1 Day Post-Op Procedure(s) (LRB): RIGHT TOTAL HIP ARTHROPLASTY ANTERIOR APPROACH (Right) Patient reports pain as moderate.  Asymptomatic acute blood loss anemai from surgery.  Objective: Vital signs in last 24 hours: Temp:  [97.8 F (36.6 C)-98 F (36.7 C)] 97.8 F (36.6 C) (08/09 0200) Pulse Rate:  [56-79] 79 (08/09 0200) Resp:  [6-18] 15 (08/08 2018) BP: (84-136)/(43-105) 118/105 (08/09 0200) SpO2:  [98 %-100 %] 100 % (08/09 0600)  Intake/Output from previous day: 08/08 0701 - 08/09 0700 In: 2341.3 [P.O.:100; I.V.:2141.3; IV Piggyback:100] Out: 1090 [Urine:690; Emesis/NG output:100; Blood:300] Intake/Output this shift: No intake/output data recorded.   Recent Labs  03/25/16 0515  HGB 9.4*    Recent Labs  03/25/16 0515  WBC 6.8  RBC 3.52*  HCT 29.4*  PLT 162    Recent Labs  03/25/16 0515  NA 140  K 4.4  CL 103  CO2 31  BUN 17  CREATININE 0.60  GLUCOSE 101*  CALCIUM 8.6*   No results for input(s): LABPT, INR in the last 72 hours.  Sensation intact distally Intact pulses distally Dorsiflexion/Plantar flexion intact Incision: dressing C/D/I  Assessment/Plan: 1 Day Post-Op Procedure(s) (LRB): RIGHT TOTAL HIP ARTHROPLASTY ANTERIOR APPROACH (Right) Up with therapy Discharge home with home health Friday  Destinie Thornsberry Y 03/25/2016, 12:55 PM

## 2016-03-25 NOTE — Progress Notes (Signed)
Physical Therapy Treatment Patient Details Name: Stephanie Andrade MRN: [id number] DOB: 1956/06/26 Today's Date: 03/25/2016    History of Present Illness Pt is a 60 y/o female s/p R THA (anterior approach, no precautions). PMH including but not limited to HTN and obesity.    PT Comments    Pt presented sitting OOB in recliner when PT entered room. Pt reported having increased pain and fatigue, requesting to return to her bed. Pt making good progress towards achieving her goals, and increased distance ambulated as compared to previous session. Pt would continue to benefit from skilled physical therapy services at this time while admitted and after d/c to address her limitations in order to improve her overall safety and independence with functional mobility.    Follow Up Recommendations  Home health PT;Supervision for mobility/OOB     Equipment Recommendations  None recommended by PT;Other (comment) (pt reported having all necessary equipment at home)    Recommendations for Other Services       Precautions / Restrictions Precautions Precautions: Fall Restrictions Weight Bearing Restrictions: Yes RLE Weight Bearing: Weight bearing as tolerated    Mobility  Bed Mobility Overal bed mobility: Needs Assistance Bed Mobility: Sit to Supine     Supine to sit: Min assist;HOB elevated Sit to supine: Min assist   General bed mobility comments: pt required increased time to complete task and min A with R LE movement  Transfers Overall transfer level: Needs assistance Equipment used: Rolling walker (2 wheeled) Transfers: Sit to/from Stand Sit to Stand: Min guard Stand pivot transfers: Min guard       General transfer comment: pt required increased time and VC'ing for bilateral hand positioning  Ambulation/Gait Ambulation/Gait assistance: Min guard Ambulation Distance (Feet): 15 Feet (15 ft x2 with sitting break on toilet to void in between) Assistive device: Rolling walker (2  wheeled) Gait Pattern/deviations: Step-to pattern;Decreased step length - left;Decreased stance time - right;Decreased weight shift to right Gait velocity: decreased Gait velocity interpretation: Below normal speed for age/gender     Stairs            Wheelchair Mobility    Modified Rankin (Stroke Patients Only)       Balance Overall balance assessment: Needs assistance Sitting-balance support: Feet supported;No upper extremity supported Sitting balance-Leahy Scale: Fair     Standing balance support: During functional activity;Single extremity supported Standing balance-Leahy Scale: Poor                      Cognition Arousal/Alertness: Awake/alert Behavior During Therapy: WFL for tasks assessed/performed Overall Cognitive Status: Within Functional Limits for tasks assessed                      Exercises Total Joint Exercises Ankle Circles/Pumps: AROM;Strengthening;Right;10 reps;Seated Quad Sets: AROM;Strengthening;Right;10 reps;Supine Heel Slides: AAROM;Strengthening;Right;10 reps;Supine Hip ABduction/ADduction: AAROM;Strengthening;Right;10 reps;Seated Straight Leg Raises: AAROM;Strengthening;Right;10 reps;Supine Long Arc Quad: AROM;Strengthening;Right;10 reps;Seated    General Comments        Pertinent Vitals/Pain Pain Assessment: 0-10 Pain Score: 7  Pain Location: R hip Pain Descriptors / Indicators: Aching;Discomfort;Grimacing;Guarding;Moaning;Operative site guarding Pain Intervention(s): Limited activity within patient's tolerance;Monitored during session;Repositioned;Ice applied    Home Living Family/patient expects to be discharged to:: Private residence Living Arrangements: Spouse/significant other;Other relatives Available Help at Discharge: Family;Available 24 hours/day Type of Home: House Home Access: Stairs to enter Entrance Stairs-Rails: Right;Left;Can reach both Home Layout: Two level;Bed/bath upstairs;1/2 bath on main  level Home Equipment: Walker - 2 wheels;Bedside commode;Shower seat  Additional Comments: Has reacher sock aid and long handled shoe horn    Prior Function Level of Independence: Independent          PT Goals (current goals can now be found in the care plan section) Acute Rehab PT Goals Patient Stated Goal: return home PT Goal Formulation: With patient Time For Goal Achievement: 04/01/16 Potential to Achieve Goals: Good Progress towards PT goals: Progressing toward goals    Frequency  7X/week    PT Plan Current plan remains appropriate    Co-evaluation             End of Session Equipment Utilized During Treatment: Gait belt Activity Tolerance: Patient limited by fatigue;Patient limited by pain Patient left: in bed;with call bell/phone within reach;with family/visitor present     Time: 0981-1914 PT Time Calculation (min) (ACUTE ONLY): 30 min  Charges:  $Gait Training: 8-22 mins $Therapeutic Exercise: 8-22 mins                    G CodesAlessandra Andrade Stephanie Andrade 04-03-16, 3:20 PM Deborah Chalk, PT, DPT [phone]

## 2016-03-25 NOTE — Evaluation (Signed)
Physical Therapy Evaluation Patient Details Name: NAZIAH MEHDI MRN: [id number] DOB: 08-Nov-1955 Today's Date: 03/25/2016   History of Present Illness  Pt is a 22 y/om female s/p R THA (anterior approach, no precautions). PMH including but not limited to HTN and obesity.  Clinical Impression  Pt presented supine in bed with HOB elevated, awake and willing to participate in therapy session. Pt's family members present in room throughout session. Pt was able to perform bed mobility with min A for movement of R LE. She was able to perform STS and SPT transfer with min guard. She ambulated approximately 3 ft with min guard. Pt was limited by pain and fatigue throughout session. PT plan for further gait training at next session. Pt would continue to benefit from skilled physical therapy services at this time while admitted and after d/c to address her below listed limitations in order to improve her overall safety and independence with functional mobility.      Follow Up Recommendations Home health PT;Supervision for mobility/OOB    Equipment Recommendations  None recommended by PT;Other (comment) (pt reported having all necessary equipment at home)    Recommendations for Other Services       Precautions / Restrictions Precautions Precautions: Fall Restrictions Weight Bearing Restrictions: Yes RLE Weight Bearing: Weight bearing as tolerated      Mobility  Bed Mobility Overal bed mobility: Needs Assistance Bed Mobility: Supine to Sit     Supine to sit: Min assist;HOB elevated     General bed mobility comments: pt required increased time to complete task and min A with R LE movement  Transfers Overall transfer level: Needs assistance Equipment used: Rolling walker (2 wheeled) Transfers: Sit to/from [employer] Sit to Stand: Min guard Stand pivot transfers: Min guard       General transfer comment: pt required increased time and VC'ing for bilateral hand  positioning  Ambulation/Gait Ambulation/Gait assistance: Min guard Ambulation Distance (Feet): 3 Feet Assistive device: Rolling walker (2 wheeled) Gait Pattern/deviations: Step-to pattern;Decreased step length - left;Decreased stance time - right;Decreased weight shift to right Gait velocity: decreased Gait velocity interpretation: Below normal speed for age/gender    Stairs            Wheelchair Mobility    Modified Rankin (Stroke Patients Only)       Balance Overall balance assessment: Needs assistance Sitting-balance support: Feet supported;Bilateral upper extremity supported Sitting balance-Leahy Scale: Poor     Standing balance support: Bilateral upper extremity supported;During functional activity Standing balance-Leahy Scale: Poor Standing balance comment: pt relied heavily on RW for stability and support                             Pertinent Vitals/Pain Pain Assessment: 0-10 Pain Score: 6  Pain Location: R hip Pain Descriptors / Indicators: Discomfort;Grimacing;Guarding;Operative site guarding;Sore Pain Intervention(s): Monitored during session;Repositioned    Home Living Family/patient expects to be discharged to:: Private residence Living Arrangements: Spouse/significant other;Other relatives Available Help at Discharge: Family;Available 24 hours/day Type of Home: House Home Access: Stairs to enter Entrance Stairs-Rails: Right;Left;Can reach both Entrance Stairs-Number of Steps: 3 Home Layout: Two level Home Equipment: Walker - 2 wheels;Bedside commode;Shower seat      Prior Function Level of Independence: Independent               Hand Dominance        Extremity/Trunk Assessment   Upper Extremity Assessment: Defer to OT evaluation  Lower Extremity Assessment: RLE deficits/detail RLE Deficits / Details: Pt with decreased strength and ROM limitations secondary to post-op. Sensation grossly intact.     Cervical / Trunk Assessment: Normal  Communication   Communication: No difficulties  Cognition Arousal/Alertness: Awake/alert Behavior During Therapy: WFL for tasks assessed/performed Overall Cognitive Status: Within Functional Limits for tasks assessed                      General Comments      Exercises Total Joint Exercises Ankle Circles/Pumps: AROM;Strengthening;Right;10 reps;Seated Hip ABduction/ADduction: AAROM;Strengthening;Right;10 reps;Seated Long Arc Quad: AROM;Strengthening;Right;10 reps;Seated      Assessment/Plan    PT Assessment Patient needs continued PT services  PT Diagnosis Difficulty walking   PT Problem List Decreased strength;Decreased range of motion;Decreased activity tolerance;Decreased balance;Decreased mobility;Decreased coordination;Decreased knowledge of use of DME;Pain  PT Treatment Interventions DME instruction;Gait training;Stair training;Functional mobility training;Therapeutic activities;Therapeutic exercise;Balance training;Patient/family education   PT Goals (Current goals can be found in the Care Plan section) Acute Rehab PT Goals Patient Stated Goal: return home PT Goal Formulation: With patient Time For Goal Achievement: 04/01/16 Potential to Achieve Goals: Good    Frequency 7X/week   Barriers to discharge        Co-evaluation               End of Session Equipment Utilized During Treatment: Gait belt Activity Tolerance: Patient limited by fatigue;Patient limited by pain Patient left: in chair;with call bell/phone within reach;with family/visitor present;Other (comment) (nurse entering room at end of session) Nurse Communication: Mobility status;Patient requests pain meds         Time: 0920-0948 PT Time Calculation (min) (ACUTE ONLY): 28 min   Charges:   PT Evaluation $PT Eval Moderate Complexity: 1 Procedure PT Treatments $Therapeutic Activity: 8-22 mins   PT G CodesAlessandra Bevels  Ciana Simmon 03/25/2016, 11:55 AM Deborah Chalk, PT, DPT [phone]

## 2016-03-26 ENCOUNTER — Encounter (HOSPITAL_COMMUNITY): Payer: Self-pay | Admitting: General Practice

## 2016-03-26 DIAGNOSIS — Z9889 Other specified postprocedural states: Secondary | ICD-10-CM

## 2016-03-26 DIAGNOSIS — R112 Nausea with vomiting, unspecified: Secondary | ICD-10-CM

## 2016-03-26 HISTORY — DX: Nausea with vomiting, unspecified: R11.2

## 2016-03-26 HISTORY — DX: Other specified postprocedural states: Z98.890

## 2016-03-26 LAB — CBC
HEMATOCRIT: 28.4 % — AB (ref 36.0–46.0)
Hemoglobin: 9 g/dL — ABNORMAL LOW (ref 12.0–15.0)
MCH: 26.6 pg (ref 26.0–34.0)
MCHC: 31.7 g/dL (ref 30.0–36.0)
MCV: 84 fL (ref 78.0–100.0)
Platelets: 154 10*3/uL (ref 150–400)
RBC: 3.38 MIL/uL — ABNORMAL LOW (ref 3.87–5.11)
RDW: 13.2 % (ref 11.5–15.5)
WBC: 8.3 10*3/uL (ref 4.0–10.5)

## 2016-03-26 NOTE — Progress Notes (Signed)
Subjective: 2 Days Post-Op Procedure(s) (LRB): RIGHT TOTAL HIP ARTHROPLASTY ANTERIOR APPROACH (Right) Patient reports pain as moderate.  Hgb 9.  A litte light-headed.  SBP down.  Will follow acute blood loss anemia.  Objective: Vital signs in last 24 hours: Temp:  [97.1 F (36.2 C)-100.7 F (38.2 C)] 100.4 F (38 C) (08/10 0211) Pulse Rate:  [88-95] 93 (08/10 0211) Resp:  [17] 17 (08/10 0211) BP: (97-121)/(50-78) 97/50 (08/10 0211) SpO2:  [88 %-98 %] 93 % (08/10 0211)  Intake/Output from previous day: 08/09 0701 - 08/10 0700 In: 850 [P.O.:850] Out: -  Intake/Output this shift: No intake/output data recorded.   Recent Labs  03/25/16 0515 03/26/16 0350  HGB 9.4* 9.0*    Recent Labs  03/25/16 0515 03/26/16 0350  WBC 6.8 8.3  RBC 3.52* 3.38*  HCT 29.4* 28.4*  PLT 162 154    Recent Labs  03/25/16 0515  NA 140  K 4.4  CL 103  CO2 31  BUN 17  CREATININE 0.60  GLUCOSE 101*  CALCIUM 8.6*   No results for input(s): LABPT, INR in the last 72 hours.  Sensation intact distally Intact pulses distally Dorsiflexion/Plantar flexion intact Incision: dressing C/D/I  Assessment/Plan: 2 Days Post-Op Procedure(s) (LRB): RIGHT TOTAL HIP ARTHROPLASTY ANTERIOR APPROACH (Right) Up with therapy Plan for discharge tomorrow Discharge home with home health  Stephanie Andrade 03/26/2016, 7:05 AM

## 2016-03-26 NOTE — Care Management Note (Signed)
Case Management Note  Patient Details  Name: Stephanie Andrade MRN: [id number] Date of Birth: 05-28-1956  Subjective/Objective:        S/p Right THA            Action/Plan: Discharge Planning:  NCM spoke to pt at bedside. Offered choice for HH/provided list. Pt agreeable to [facility] for [facility]. Pt reports she has RW, cane, elevated toilet seats,  Crutches, and shower chair at home.    Expected Discharge Date:               Expected Discharge Plan:  [place]  In-House Referral:  NA  Discharge planning Services  CM Consult  Post Acute Care Choice:  Home Health Choice offered to:  Patient  DME Arranged:  N/A DME Agency:  NA  HH Arranged:  PT [place] Agency:  [facility] (now [facility])  Status of Service:  Completed, signed off  If discussed at [place] of Stay Meetings, dates discussed:    Additional Comments:  Erenest Rasher, RN 03/26/2016, 12:29 PM

## 2016-03-26 NOTE — Progress Notes (Signed)
Physical Therapy Treatment Patient Details Name: Stephanie Andrade MRN: [id number] DOB: 24-Sep-1955 Today's Date: 03/26/2016    History of Present Illness Pt is a 60 y/o female s/p R THA (anterior approach, no precautions). PMH including but not limited to HTN and obesity.    PT Comments    Pt presented sitting OOB in recliner when PT entered room. Pt making excellent progress towards achieving her goals. Pt successfully completed stair training during this session. Pt would continue to benefit from skilled physical therapy services at this time while admitted and after d/c to address her limitations in order to improve her overall safety and independence with functional mobility.   Follow Up Recommendations  Home health PT;Supervision for mobility/OOB     Equipment Recommendations  None recommended by PT    Recommendations for Other Services       Precautions / Restrictions Precautions Precautions: Fall Restrictions Weight Bearing Restrictions: Yes RLE Weight Bearing: Weight bearing as tolerated    Mobility  Bed Mobility Overal bed mobility: Needs Assistance Bed Mobility: Sit to Supine       Sit to supine: Supervision   General bed mobility comments: pt required increased time  Transfers Overall transfer level: Needs assistance Equipment used: Rolling walker (2 wheeled) Transfers: Sit to/from Stand Sit to Stand: Min guard         General transfer comment: pt required increased time  Ambulation/Gait Ambulation/Gait assistance: Min guard Ambulation Distance (Feet): 30 Feet (30 ft x2 with stair training in between) Assistive device: Rolling walker (2 wheeled) Gait Pattern/deviations: Step-to pattern;Decreased step length - left;Decreased stance time - right;Decreased weight shift to right Gait velocity: decreased Gait velocity interpretation: Below normal speed for age/gender     Stairs Stairs: Yes Stairs assistance: Min guard Stair Management: One rail  Right;Step to pattern Number of Stairs: 13 General stair comments: pt ascended with L LE leading and descended with R LE leading  Wheelchair Mobility    Modified Rankin (Stroke Patients Only)       Balance Overall balance assessment: Needs assistance Sitting-balance support: Feet supported;No upper extremity supported Sitting balance-Leahy Scale: Fair     Standing balance support: During functional activity;No upper extremity supported Standing balance-Leahy Scale: Fair                      Cognition Arousal/Alertness: Awake/alert Behavior During Therapy: WFL for tasks assessed/performed Overall Cognitive Status: Within Functional Limits for tasks assessed                      Exercises      General Comments        Pertinent Vitals/Pain Pain Assessment: No/denies pain Pain Intervention(s): Monitored during session    Home Living                      Prior Function            PT Goals (current goals can now be found in the care plan section) Acute Rehab PT Goals Patient Stated Goal: return home PT Goal Formulation: With patient Time For Goal Achievement: 04/01/16 Potential to Achieve Goals: Good Progress towards PT goals: Progressing toward goals    Frequency  7X/week    PT Plan Current plan remains appropriate    Co-evaluation             End of Session Equipment Utilized During Treatment: Gait belt Activity Tolerance: Patient limited by pain Patient left: in  bed;with call bell/phone within reach;with family/visitor present     Time: 1334-1400 PT Time Calculation (min) (ACUTE ONLY): 26 min  Charges:  $Gait Training: 23-37 mins                    G CodesAlessandra Bevels Masson Nalepa April 25, 2016, 5:53 PM Deborah Chalk, PT, DPT [phone]

## 2016-03-26 NOTE — Progress Notes (Signed)
Occupational Therapy Treatment Patient Details Name: Stephanie Andrade MRN: [id number] DOB: 08-15-1956 Today's Date: 03/26/2016    History of present illness Pt is a 60 y/o female s/p R THA (anterior approach, no precautions). PMH including but not limited to HTN and obesity.   OT comments  Educated pt and her husband on use of ADL A/E, tub bench. Pt's husband had A/E and DME from previous THA and familiar with how to use equipment. OT will continue to follow acutely  Follow Up Recommendations  No OT follow up;Supervision - Intermittent    Equipment Recommendations  Other (comment);3 in 1 bedside comode (ADL A/E)    Recommendations for Other Services      Precautions / Restrictions Precautions Precautions: Fall Restrictions Weight Bearing Restrictions: Yes RLE Weight Bearing: Weight bearing as tolerated       Mobility Bed Mobility Overal bed mobility: Needs Assistance Bed Mobility: Sit to Supine       Sit to supine: Supervision   General bed mobility comments: pt up in recliner  Transfers Overall transfer level: Needs assistance Equipment used: Rolling walker (2 wheeled) Transfers: Sit to/from Stand Sit to Stand: Min guard         General transfer comment: pt required increased time    Balance Overall balance assessment: Needs assistance Sitting-balance support: Feet supported;No upper extremity supported Sitting balance-Leahy Scale: Fair     Standing balance support: During functional activity;Single extremity supported Standing balance-Leahy Scale: Poor                     ADL Overall ADL's : Needs assistance/impaired             Lower Body Bathing: Minimal assistance;Sit to/from stand;With caregiver independent assisting Lower Body Bathing Details (indicate cue type and reason): educated on using LH sponge     Lower Body Dressing: Minimal assistance;Sit to/from stand;With caregiver independent assisting Lower Body Dressing Details  (indicate cue type and reason): educated on using reacher, LH shoe horn and sock aid               General ADL Comments: Pt and her husband have a tub bench at home but have never used it      Vision  wears glasses, no change from baseline                              Cognition   Behavior During Therapy: [facility] for tasks assessed/performed Overall Cognitive Status: Within Functional Limits for tasks assessed                       Extremity/Trunk Assessment   WNL                       General Comments  Pt very pleasant    Pertinent Vitals/ Pain       Pain Assessment: 0-10 Pain Score: 3  Pain Location: R hip Pain Descriptors / Indicators: Sore Pain Intervention(s): Monitored during session;Premedicated before session  Home Living  with husband                                        Prior Functioning/Environment  independent           Frequency Min 2X/week     Progress Toward Goals  OT Goals(current goals can now be found in the care plan section)  Progress towards OT goals: OT to reassess next treatment  Acute Rehab OT Goals Patient Stated Goal: return home  Plan Discharge plan remains appropriate                     End of Session     Activity Tolerance Patient tolerated treatment well   Patient Left in chair;with call bell/phone within reach;with family/visitor present;Other (comment) (PT)             Time: 1610-9604 OT Time Calculation (min): 21 min  Charges: OT General Charges $OT Visit: 1 Procedure OT Treatments $Therapeutic Activity: 8-22 mins  Galen Manila 03/26/2016, 1:36 PM

## 2016-03-26 NOTE — Progress Notes (Signed)
Physical Therapy Treatment Patient Details Name: Stephanie Andrade MRN: [id number] DOB: Dec 31, 1955 Today's Date: 03/26/2016    History of Present Illness Pt is a 60 y/o female s/p R THA (anterior approach, no precautions). PMH including but not limited to HTN and obesity.    PT Comments    Pt presented sitting OOB in recliner, awake and willing to participate in therapy session. Pt making good progress towards achieving her goals. Pt reported having some dizziness and nausea this AM; therefore, stair training deferred to next session. Pt would continue to benefit from skilled physical therapy services at this time while admitted and after d/c to address her limitations in order to improve her overall safety and independence with functional mobility.    Follow Up Recommendations  Home health PT;Supervision for mobility/OOB     Equipment Recommendations  None recommended by PT    Recommendations for Other Services       Precautions / Restrictions Precautions Precautions: Fall Restrictions Weight Bearing Restrictions: Yes RLE Weight Bearing: Weight bearing as tolerated    Mobility  Bed Mobility Overal bed mobility: Needs Assistance Bed Mobility: Sit to Supine       Sit to supine: Supervision   General bed mobility comments: pt required increased time to complete task  Transfers Overall transfer level: Needs assistance Equipment used: Rolling walker (2 wheeled) Transfers: Sit to/from Stand Sit to Stand: Min guard         General transfer comment: pt required increased time  Ambulation/Gait Ambulation/Gait assistance: Min guard Ambulation Distance (Feet): 20 Feet (20 ft x2 with sitting on toilet to void in between) Assistive device: Rolling walker (2 wheeled) Gait Pattern/deviations: Step-to pattern;Decreased step length - left;Decreased stance time - right;Decreased weight shift to right Gait velocity: decreased Gait velocity interpretation: Below normal speed for  age/gender     Stairs            Wheelchair Mobility    Modified Rankin (Stroke Patients Only)       Balance Overall balance assessment: Needs assistance Sitting-balance support: Feet supported;No upper extremity supported Sitting balance-Leahy Scale: Fair     Standing balance support: During functional activity;Single extremity supported Standing balance-Leahy Scale: Poor                      Cognition Arousal/Alertness: Awake/alert Behavior During Therapy: WFL for tasks assessed/performed Overall Cognitive Status: Within Functional Limits for tasks assessed                      Exercises Total Joint Exercises Ankle Circles/Pumps: AROM;Strengthening;Both;10 reps;Seated Heel Slides: AROM;Strengthening;Right;10 reps;Supine Hip ABduction/ADduction: AROM;Right;10 reps;Supine Long Arc Quad: AROM;Strengthening;Right;10 reps;Seated Marching in Standing: AROM;Strengthening;Right;10 reps;Seated Bridges: AROM;Strengthening;Both;10 reps;Supine    General Comments        Pertinent Vitals/Pain Pain Assessment: No/denies pain Pain Intervention(s): Monitored during session    Home Living                      Prior Function            PT Goals (current goals can now be found in the care plan section) Acute Rehab PT Goals Patient Stated Goal: return home PT Goal Formulation: With patient Time For Goal Achievement: 04/01/16 Potential to Achieve Goals: Good Progress towards PT goals: Progressing toward goals    Frequency  7X/week    PT Plan Current plan remains appropriate    Co-evaluation  End of Session Equipment Utilized During Treatment: Gait belt Activity Tolerance: Patient tolerated treatment well Patient left: in bed;with call bell/phone within reach;with family/visitor present     Time: 2595-6387 PT Time Calculation (min) (ACUTE ONLY): 33 min  Charges:  $Gait Training: 8-22 mins $Therapeutic Exercise:  8-22 mins                    G Codes:      Alessandra Bevels Falon Flinchum Apr 17, 2016, 10:35 AM Deborah Chalk, PT, DPT [phone]

## 2016-03-27 MED ORDER — OXYCODONE-ACETAMINOPHEN 5-325 MG PO TABS: 1.0000 | ORAL_TABLET | ORAL | 0 refills | Status: DC | PRN

## 2016-03-27 MED ORDER — METHOCARBAMOL 500 MG PO TABS: 500.0000 mg | ORAL_TABLET | Freq: Four times a day (QID) | ORAL | 0 refills | Status: DC | PRN

## 2016-03-27 MED ORDER — ASPIRIN 325 MG PO TBEC: 325.0000 mg | DELAYED_RELEASE_TABLET | Freq: Two times a day (BID) | ORAL | 0 refills | Status: DC

## 2016-03-27 NOTE — Progress Notes (Signed)
Pt discharge education and instructions completed with pt and family at bedside; all voices understanding and denies any questions. Pt IV removed; hip incision dsg remains clean, dry and intact with no active bleeding noted. Pt handed her prescriptions for percocet, robaxin and aspirin. Pt transported off unit via wheelchair with belongings and family at the side. Pt discharge home with family to transport her home. Delia Heady RN

## 2016-03-27 NOTE — Discharge Summary (Signed)
Patient ID: Stephanie Andrade MRN: [id number] DOB/AGE: 10/21/1955 60 y.o.  Admit date: 03/24/2016 Discharge date: 03/27/2016  Admission Diagnoses:  Principal Problem:   Osteoarthritis of right hip Active Problems:   Status post total replacement of right hip   Discharge Diagnoses:  Same  Past Medical History:  Diagnosis Date  . Arthritis   . Asthma   . Depression   . Family history of adverse reaction to anesthesia    sister, brother - PONV  . Family history of ovarian cancer   . Fibroids   . History of anemia   . History of bronchitis   . History of pneumonia   . History of sarcoidosis   . Hypertension   . PONV (postoperative nausea and vomiting) 03/26/2016  . Wears glasses     Surgeries: Procedure(s): RIGHT TOTAL HIP ARTHROPLASTY ANTERIOR APPROACH on 03/24/2016   Consultants:   Discharged Condition: Improved  Hospital Course: Stephanie Andrade is an 60 y.o. female who was admitted 03/24/2016 for operative treatment ofOsteoarthritis of right hip. Patient has severe unremitting pain that affects sleep, daily activities, and work/hobbies. After pre-op clearance the patient was taken to the operating room on 03/24/2016 and underwent  Procedure(s): RIGHT TOTAL HIP ARTHROPLASTY ANTERIOR APPROACH.    Patient was given perioperative antibiotics: Anti-infectives    Start     Dose/Rate Route Frequency Ordered Stop   03/24/16 2200  ceFAZolin (ANCEF) IVPB 1 g/50 mL premix     1 g 100 mL/hr over 30 Minutes Intravenous Every 6 hours 03/24/16 2037 03/25/16 0431   03/24/16 1139  ceFAZolin (ANCEF) 2-4 GM/100ML-% IVPB    Comments:  Lorenda Ishihara   : cabinet override      03/24/16 1139 03/24/16 2344   03/24/16 1137  ceFAZolin (ANCEF) IVPB 2g/100 mL premix     2 g 200 mL/hr over 30 Minutes Intravenous On call to O.R. 03/24/16 1137 03/24/16 1500       Patient was given sequential compression devices, early ambulation, and chemoprophylaxis to prevent DVT.  Patient benefited maximally  from hospital stay and there were no complications.    Recent vital signs: Patient Vitals for the past 24 hrs:  BP Temp Temp src Pulse Resp SpO2  03/27/16 0444 (!) 112/44 99.7 F (37.6 C) Axillary 84 16 95 %  03/26/16 2001 (!) 106/43 98.1 F (36.7 C) Oral 95 16 96 %  03/26/16 1333 (!) 101/46 97.1 F (36.2 C) Oral 78 16 98 %  03/26/16 0949 (!) 102/53 - - 82 18 100 %     Recent laboratory studies:  Recent Labs  03/25/16 0515 03/26/16 0350  WBC 6.8 8.3  HGB 9.4* 9.0*  HCT 29.4* 28.4*  PLT 162 154  NA 140  --   K 4.4  --   CL 103  --   CO2 31  --   BUN 17  --   CREATININE 0.60  --   GLUCOSE 101*  --   CALCIUM 8.6*  --      Discharge Medications:     Medication List    STOP taking these medications   meloxicam 15 MG tablet Commonly known as:  MOBIC     TAKE these medications   acetaminophen 500 MG tablet Commonly known as:  TYLENOL Take 1,000 mg by mouth 3 (three) times daily.   albuterol 108 (90 Base) MCG/ACT inhaler Commonly known as:  PROVENTIL HFA;VENTOLIN HFA Inhale 2 puffs into the lungs every 6 (six) hours as needed for wheezing or  shortness of breath.   aspirin 325 MG EC tablet Take 1 tablet (325 mg total) by mouth 2 (two) times daily after a meal.   buPROPion 150 MG 12 hr tablet Commonly known as:  WELLBUTRIN SR Take 150 mg by mouth 2 (two) times daily.   cholecalciferol 1000 units tablet Commonly known as:  VITAMIN D Take 1,000 Units by mouth daily.   CONTRAVE 8-90 MG Tb12 Generic drug:  Naltrexone-Bupropion HCl ER Take 1 tab twice daily What changed:  how much to take  how to take this  when to take this  additional instructions   diazepam 5 MG tablet Commonly known as:  VALIUM For muscle spasm   Fish Oil 1000 MG Caps Take 1,000 mg by mouth daily.   losartan 50 MG tablet Commonly known as:  COZAAR Take 1 tablet (50 mg total) by mouth daily.   methocarbamol 500 MG tablet Commonly known as:  ROBAXIN Take 1 tablet (500 mg  total) by mouth every 6 (six) hours as needed for muscle spasms.   oxyCODONE-acetaminophen 5-325 MG tablet Commonly known as:  PERCOCET/ROXICET Take 1-2 tablets by mouth every 4 (four) hours as needed for severe pain. What changed:  how much to take  when to take this   Turmeric 500 MG Tabs Take 500 mg by mouth daily.   zolpidem 5 MG tablet Commonly known as:  AMBIEN TAKE 1 TABLET AT BEDTIME AS NEEDED FOR SLEEP.       Diagnostic Studies: Dg C-arm 1-60 Min  Result Date: 03/24/2016 CLINICAL DATA:  Right total hip replacement EXAM: OPERATIVE right HIP (WITH PELVIS IF PERFORMED) 2 VIEWS TECHNIQUE: Fluoroscopic spot image(s) were submitted for interpretation post-operatively. COMPARISON:  11/20/2015. FINDINGS: Intraoperative spot fluoro film show the patient be status post right total hip replacement. No evidence for immediate hardware complications. IMPRESSION: Intraoperative evaluation during right hip replacement. Electronically Signed   By: Kennith Center M.D.   On: 03/24/2016 16:46   Dg Hip Port Unilat With Pelvis 1v Right  Result Date: 03/24/2016 CLINICAL DATA:  Status post total replacement of right hip EXAM: DG HIP (WITH OR WITHOUT PELVIS) 1V PORT RIGHT COMPARISON:  04/05/2015 and current operative images. FINDINGS: The femoral and acetabular components of the new right hip arthroplasty appear well seated and well aligned. There is no acute fracture or evidence of an operative complication. IMPRESSION: Well-positioned right hip total arthroplasty. Electronically Signed   By: Amie Portland M.D.   On: 03/24/2016 17:22   Dg Hip Operative Unilat W Or W/o Pelvis Right  Result Date: 03/24/2016 CLINICAL DATA:  Right total hip replacement EXAM: OPERATIVE right HIP (WITH PELVIS IF PERFORMED) 2 VIEWS TECHNIQUE: Fluoroscopic spot image(s) were submitted for interpretation post-operatively. COMPARISON:  11/20/2015. FINDINGS: Intraoperative spot fluoro film show the patient be status post right  total hip replacement. No evidence for immediate hardware complications. IMPRESSION: Intraoperative evaluation during right hip replacement. Electronically Signed   By: Kennith Center M.D.   On: 03/24/2016 16:46    Disposition: 01-Home or Self Care  Discharge Instructions    Call MD / Call 911    Complete by:  As directed   If you experience chest pain or shortness of breath, CALL 911 and be transported to the hospital emergency room.  If you develope a fever above 101 F, pus (white drainage) or increased drainage or redness at the wound, or calf pain, call your surgeon's office.   Constipation Prevention    Complete by:  As directed  Drink plenty of fluids.  Prune juice may be helpful.  You may use a stool softener, such as Colace (over the counter) 100 mg twice a day.  Use MiraLax (over the counter) for constipation as needed.   Diet - low sodium heart healthy    Complete by:  As directed   Discharge patient    Complete by:  As directed   Increase activity slowly as tolerated    Complete by:  As directed      Follow-up Information    [facility] .   Why:  Home Health Physical Therapy Contact information: [address] [place] [address] [phone]        Kathryne Hitch, MD Follow up in 2 week(s).   Specialty:  Orthopedic Surgery Contact information: [address] [place] [address] [phone]        Kathryne Hitch, MD .   Specialty:  Orthopedic Surgery Contact information: [address] [place] [address] [phone]            Signed: Kathryne Hitch 03/27/2016, 6:57 AM

## 2016-03-27 NOTE — Progress Notes (Signed)
Patient ID: Stephanie Andrade, female   DOB: Jan 13, 1956, 60 y.o.   MRN: [id number] Doing well.  Can discharge to home today.

## 2016-03-27 NOTE — Progress Notes (Signed)
Physical Therapy Treatment Patient Details Name: Stephanie Andrade MRN: [id number] DOB: 26-Aug-1955 Today's Date: 03/27/2016    History of Present Illness Pt is a 60 y/o female s/p R THA (anterior approach, no precautions). PMH including but not limited to HTN and obesity.    PT Comments    Pt presented sitting OOB in recliner when PT entered room. Pt continuing to make good progress towards achieving her goals. Pt again stair training during this session. Pt would continue to benefit from skilled physical therapy services at this time while admitted and after d/c to address her limitations in order to improve her overall safety and independence with functional mobility.    Follow Up Recommendations  Home health PT;Supervision for mobility/OOB     Equipment Recommendations  None recommended by PT    Recommendations for Other Services       Precautions / Restrictions Precautions Precautions: Fall Restrictions Weight Bearing Restrictions: Yes RLE Weight Bearing: Weight bearing as tolerated    Mobility  Bed Mobility               General bed mobility comments: pt was OOB in recliner when PT entered room  Transfers Overall transfer level: Needs assistance Equipment used: Rolling walker (2 wheeled) Transfers: Sit to/from Stand Sit to Stand: Supervision         General transfer comment: pt required increased time  Ambulation/Gait Ambulation/Gait assistance: Supervision Ambulation Distance (Feet): 200 Feet (200 ft x2 with stair training in between) Assistive device: Rolling walker (2 wheeled) Gait Pattern/deviations: Step-through pattern;Decreased step length - left;Decreased stance time - right;Decreased weight shift to right Gait velocity: decreased Gait velocity interpretation: Below normal speed for age/gender     Stairs Stairs: Yes Stairs assistance: Min guard Stair Management: One rail Right Number of Stairs: 13 General stair comments: pt ascended with  L LE leading and descended with R LE leading  Wheelchair Mobility    Modified Rankin (Stroke Patients Only)       Balance Overall balance assessment: Needs assistance Sitting-balance support: Feet unsupported;No upper extremity supported Sitting balance-Leahy Scale: Good     Standing balance support: During functional activity;No upper extremity supported Standing balance-Leahy Scale: Fair                      Cognition Arousal/Alertness: Awake/alert Behavior During Therapy: WFL for tasks assessed/performed Overall Cognitive Status: Within Functional Limits for tasks assessed                      Exercises Total Joint Exercises Hip ABduction/ADduction: AROM;Strengthening;Right;10 reps;Standing Knee Flexion: AROM;Strengthening;Right;10 reps;Standing Standing Hip Extension: AROM;Strengthening;Right;10 reps    General Comments        Pertinent Vitals/Pain Pain Assessment: 0-10 Pain Score: 3  Pain Location: R hip Pain Descriptors / Indicators: Discomfort Pain Intervention(s): Monitored during session;Repositioned    Home Living                      Prior Function            PT Goals (current goals can now be found in the care plan section) Acute Rehab PT Goals Patient Stated Goal: return home PT Goal Formulation: With patient Time For Goal Achievement: 04/01/16 Potential to Achieve Goals: Good Progress towards PT goals: Progressing toward goals    Frequency  7X/week    PT Plan Current plan remains appropriate    Co-evaluation  End of Session Equipment Utilized During Treatment: Gait belt Activity Tolerance: Patient tolerated treatment well Patient left: in bed;with call bell/phone within reach;with family/visitor present     Time: 3474-2595 PT Time Calculation (min) (ACUTE ONLY): 27 min  Charges:  $Gait Training: 23-37 mins                    G CodesAlessandra Bevels Bernabe Dorce 2016-03-29, 11:37 AM Deborah Chalk, PT, DPT [phone]

## 2016-03-27 NOTE — Discharge Instructions (Signed)
Hip Arthroscopy, Care After Refer to this sheet in the next few weeks. These instructions provide you with information about caring for yourself after your procedure. Your health care provider may also give you more specific instructions. Your treatment has been planned according to current medical practices, but problems sometimes occur. Call your health care provider if you have any problems or questions after your procedure. WHAT TO EXPECT AFTER THE PROCEDURE After your procedure, it is common to have:  Pain and tenderness.  Stiffness.  Swelling and bruising at the incision site.  Nausea.  Dizziness, weakness, and drowsiness. This may last for up to 24 hours after you have anesthetic.  Trouble walking, or you walk with a limp. HOME CARE INSTRUCTIONS Activities  Return to your normal activities as directed by your health care provider. Ask your health care provider what activities are safe for you.  Do not drive or operate heavy machinery while taking pain medicine. Ask your health care provider when it is safe for you to drive again.  Do not lift anything that is heavier than 10 lb (4.5 kg) until your health care provider says that you can.  Do not play contact sports until your health care provider says that you can.  Do physical therapy exercises as recommended by your health care provider. Incision Care  Do not take baths, swim, or use a hot tub until your health care provider approves. You may take showers.  Wear compression stockings as directed by your health care provider. These stockings help to prevent blood clots and reduce swelling in your legs.  There are many different ways to close and cover an incision, including stitches (sutures), skin glue, and adhesive strips. Follow your health care provider's instructions about:  Incision care.  Bandage (dressing) changes and removal.  Incision closure removal.  Check your incision area every day for signs of  infection. Watch for:  Redness, swelling, or pain.  Fluid, blood, or pus. General Instructions  Take medicines only as directed by your health care provider.  Use crutches as directed by your health care provider.  Keep all follow-up visits as directed by your health care provider. This is important. SEEK MEDICAL CARE IF:  You have persistent dizziness or nausea.  You vomit.  You develop a fever.  You have a cough.  You have redness, swelling, or pain at the site of your incision or in the joint.  You have fluid, blood, or pus coming from your incision.  You have pain that is getting worse.  There is yellowish-white fluid (pus) coming from your incision.  There is a bad smell coming from your incision or dressing.  The edges of your incision come apart after the tape or sutures have been removed.  You feel light-headed or faint.  Your joint soreness gets worse.  Your leg starts to get numb. SEEK IMMEDIATE MEDICAL CARE IF:  You develop a rash.  You have trouble eating or drinking.  You develop swelling in your calf or your leg.  You have chest pain.  You have shortness of breath.   This information is not intended to replace advice given to you by your health care provider. Make sure you discuss any questions you have with your health care provider.   Document Released: 08/23/2007 Document Revised: 08/24/2014 Document Reviewed: 03/27/2014 Elsevier Interactive Patient Education 2016 [employer]   o Remove items at home which could result in a fall. This includes throw rugs or  furniture in walking pathways o  Hip Arthroscopy, Care After Refer to this sheet in the next few weeks. These instructions provide you with information about caring for yourself after your procedure. Your health care provider may also give you more specific instructions. Your treatment has been planned according to current medical practices, but  problems sometimes occur. Call your health care provider if you have any problems or questions after your procedure. WHAT TO EXPECT AFTER THE PROCEDURE After your procedure, it is common to have: Pain and tenderness. Stiffness. Swelling and bruising at the incision site. Nausea. Dizziness, weakness, and drowsiness. This may last for up to 24 hours after you have anesthetic. Trouble walking, or you walk with a limp. HOME CARE INSTRUCTIONS Activities Return to your normal activities as directed by your health care provider. Ask your health care provider what activities are safe for you. Do not drive or operate heavy machinery while taking pain medicine. Ask your health care provider when it is safe for you to drive again. Do not lift anything that is heavier than 10 lb (4.5 kg) until your health care provider says that you can. Do not play contact sports until your health care provider says that you can. Do physical therapy exercises as recommended by your health care provider. Incision Care Do not take baths, swim, or use a hot tub until your health care provider approves. You may take showers. Wear compression stockings as directed by your health care provider. These stockings help to prevent blood clots and reduce swelling in your legs. There are many different ways to close and cover an incision, including stitches (sutures), skin glue, and adhesive strips. Follow your health care provider's instructions about: Incision care. Bandage (dressing) changes and removal. Incision closure removal. Check your incision area every day for signs of infection. Watch for: Redness, swelling, or pain. Fluid, blood, or pus. General Instructions Take medicines only as directed by your health care provider. Use crutches as directed by your health care provider. Keep all follow-up visits as directed by your health care provider. This is important. SEEK MEDICAL CARE IF: You have persistent dizziness or  nausea. You vomit. You develop a fever. You have a cough. You have redness, swelling, or pain at the site of your incision or in the joint. You have fluid, blood, or pus coming from your incision. You have pain that is getting worse. There is yellowish-white fluid (pus) coming from your incision. There is a bad smell coming from your incision or dressing. The edges of your incision come apart after the tape or sutures have been removed. You feel light-headed or faint. Your joint soreness gets worse. Your leg starts to get numb. SEEK IMMEDIATE MEDICAL CARE IF: You develop a rash. You have trouble eating or drinking. You develop swelling in your calf or your leg. You have chest pain. You have shortness of breath.   This information is not intended to replace advice given to you by your health care provider. Make sure you discuss any questions you have with your health care provider.   Document Released: 08/23/2007 Document Revised: 08/24/2014 Document Reviewed: 03/27/2014 Elsevier Interactive Patient Education 2016 [place] to the affected joint every three hours while awake for 30 minutes at a time, for at least the first 3-5 days, and then as needed for pain and swelling.  Continue to use ice for pain and swelling. You may notice swelling that will progress down to the foot and ankle.  This  is normal after surgery.  Elevate your leg when you are not up walking on it.   o Continue to use the breathing machine you got in the hospital (incentive spirometer) which will help keep your temperature down.  It is common for your temperature to cycle up and down following surgery, especially at night when you are not up moving around and exerting yourself.  The breathing machine keeps your lungs expanded and your temperature down.   DIET:  As you were doing prior to hospitalization, we recommend a well-balanced diet.  DRESSING / WOUND CARE / SHOWERING  Keep the surgical dressing  until follow up.  The dressing is water proof, so you can shower without any extra covering.  IF THE DRESSING FALLS OFF or the wound gets wet inside, change the dressing with sterile gauze.  Please use good hand washing techniques before changing the dressing.  Do not use any lotions or creams on the incision until instructed by your surgeon.    ACTIVITY  o Increase activity slowly as tolerated, but follow the weight bearing instructions below.   o No driving for 6 weeks or until further direction given by your physician.  You cannot drive while taking narcotics.  o No lifting or carrying greater than 10 lbs. until further directed by your surgeon. o Avoid periods of inactivity such as sitting longer than an hour when not asleep. This helps prevent blood clots.  o You may return to work once you are authorized by your doctor.     WEIGHT BEARING   Weight bearing as tolerated with assist device (walker, cane, etc) as directed, use it as long as suggested by your surgeon or therapist, typically at least 4-6 weeks.   EXERCISES  Results after joint replacement surgery are often greatly improved when you follow the exercise, range of motion and muscle strengthening exercises prescribed by your doctor. Safety measures are also important to protect the joint from further injury. Any time any of these exercises cause you to have increased pain or swelling, decrease what you are doing until you are comfortable again and then slowly increase them. If you have problems or questions, call your caregiver or physical therapist for advice.   Rehabilitation is important following a joint replacement. After just a few days of immobilization, the muscles of the leg can become weakened and shrink (atrophy).  These exercises are designed to build up the tone and strength of the thigh and leg muscles and to improve motion. Often times heat used for twenty to thirty minutes before working out will loosen up your  tissues and help with improving the range of motion but do not use heat for the first two weeks following surgery (sometimes heat can increase post-operative swelling).   These exercises can be done on a training (exercise) mat, on the floor, on a table or on a bed. Use whatever works the best and is most comfortable for you.    Use music or television while you are exercising so that the exercises are a pleasant break in your day. This will make your life better with the exercises acting as a break in your routine that you can look forward to.   Perform all exercises about fifteen times, three times per day or as directed.  You should exercise both the operative leg and the other leg as well.  Exercises include:    Quad Sets - Tighten up the muscle on the front of the thigh [employer]) and  hold for 5-10 seconds.    Straight Leg Raises - With your knee straight (if you were given a brace, keep it on), lift the leg to 60 degrees, hold for 3 seconds, and slowly lower the leg.  Perform this exercise against resistance later as your leg gets stronger.   Leg Slides: Lying on your back, slowly slide your foot toward your buttocks, bending your knee up off the floor (only go as far as is comfortable). Then slowly slide your foot back down until your leg is flat on the floor again.   Angel Wings: Lying on your back spread your legs to the side as far apart as you can without causing discomfort.   Hamstring Strength:  Lying on your back, push your heel against the floor with your leg straight by tightening up the muscles of your buttocks.  Repeat, but this time bend your knee to a comfortable angle, and push your heel against the floor.  You may put a pillow under the heel to make it more comfortable if necessary.   A rehabilitation program following joint replacement surgery can speed recovery and prevent re-injury in the future due to weakened muscles. Contact your doctor or a physical therapist for more  information on knee rehabilitation.    CONSTIPATION  Constipation is defined medically as fewer than three stools per week and severe constipation as less than one stool per week.  Even if you have a regular bowel pattern at home, your normal regimen is likely to be disrupted due to multiple reasons following surgery.  Combination of anesthesia, postoperative narcotics, change in appetite and fluid intake all can affect your bowels.   YOU MUST use at least one of the following options; they are listed in order of increasing strength to get the job done.  They are all available over the counter, and you may need to use some, POSSIBLY even all of these options:    Drink plenty of fluids (prune juice may be helpful) and high fiber foods Colace 100 mg by mouth twice a day  Senokot for constipation as directed and as needed Dulcolax (bisacodyl), take with full glass of water  Miralax (polyethylene glycol) once or twice a day as needed.  If you have tried all these things and are unable to have a bowel movement in the first 3-4 days after surgery call either your surgeon or your primary doctor.    If you experience loose stools or diarrhea, hold the medications until you stool forms back up.  If your symptoms do not get better within 1 week or if they get worse, check with your doctor.  If you experience "the worst abdominal pain ever" or develop nausea or vomiting, please contact the office immediately for further recommendations for treatment.   ITCHING:  If you experience itching with your medications, try taking only a single pain pill, or even half a pain pill at a time.  You can also use Benadryl over the counter for itching or also to help with sleep.   TED HOSE STOCKINGS:  Use stockings on both legs until for at least 2 weeks or as directed by physician office. They may be removed at night for sleeping.  MEDICATIONS:  See your medication summary on the After Visit Summary that nursing will  review with you.  You may have some home medications which will be placed on hold until you complete the course of blood thinner medication.  It is important for you to  complete the blood thinner medication as prescribed.  PRECAUTIONS:  If you experience chest pain or shortness of breath - call 911 immediately for transfer to the hospital emergency department.   If you develop a fever greater that 101 F, purulent drainage from wound, increased redness or drainage from wound, foul odor from the wound/dressing, or calf pain - CONTACT YOUR SURGEON.                                                   FOLLOW-UP APPOINTMENTS:  If you do not already have a post-op appointment, please call the office for an appointment to be seen by your surgeon.  Guidelines for how soon to be seen are listed in your After Visit Summary, but are typically between 1-4 weeks after surgery.  OTHER INSTRUCTIONS:   Knee Replacement:  Do not place pillow under knee, focus on keeping the knee straight while resting. CPM instructions: 0-90 degrees, 2 hours in the morning, 2 hours in the afternoon, and 2 hours in the evening. Place foam block, curve side up under heel at all times except when in CPM or when walking.  DO NOT modify, tear, cut, or change the foam block in any way.  MAKE SURE YOU:   Understand these instructions.   Get help right away if you are not doing well or get worse.    Thank you for letting us be a part of your medical care team.  It is a privilege we respect greatly.  We hope these instructions will help you stay on track for a fast and full recovery!

## 2016-03-28 DIAGNOSIS — Z96641 Presence of right artificial hip joint: Secondary | ICD-10-CM | POA: Diagnosis not present

## 2016-03-28 DIAGNOSIS — M5412 Radiculopathy, cervical region: Secondary | ICD-10-CM | POA: Diagnosis not present

## 2016-03-28 DIAGNOSIS — F329 Major depressive disorder, single episode, unspecified: Secondary | ICD-10-CM | POA: Diagnosis not present

## 2016-03-28 DIAGNOSIS — D869 Sarcoidosis, unspecified: Secondary | ICD-10-CM | POA: Diagnosis not present

## 2016-03-28 DIAGNOSIS — Z471 Aftercare following joint replacement surgery: Secondary | ICD-10-CM | POA: Diagnosis not present

## 2016-03-28 DIAGNOSIS — M4806 Spinal stenosis, lumbar region: Secondary | ICD-10-CM | POA: Diagnosis not present

## 2016-03-28 DIAGNOSIS — Z87891 Personal history of nicotine dependence: Secondary | ICD-10-CM | POA: Diagnosis not present

## 2016-03-28 DIAGNOSIS — J45909 Unspecified asthma, uncomplicated: Secondary | ICD-10-CM | POA: Diagnosis not present

## 2016-03-28 DIAGNOSIS — I10 Essential (primary) hypertension: Secondary | ICD-10-CM | POA: Diagnosis not present

## 2016-03-28 DIAGNOSIS — M1612 Unilateral primary osteoarthritis, left hip: Secondary | ICD-10-CM | POA: Diagnosis not present

## 2016-03-30 DIAGNOSIS — Z471 Aftercare following joint replacement surgery: Secondary | ICD-10-CM | POA: Diagnosis not present

## 2016-03-30 DIAGNOSIS — Z87891 Personal history of nicotine dependence: Secondary | ICD-10-CM | POA: Diagnosis not present

## 2016-03-30 DIAGNOSIS — M5412 Radiculopathy, cervical region: Secondary | ICD-10-CM | POA: Diagnosis not present

## 2016-03-30 DIAGNOSIS — D869 Sarcoidosis, unspecified: Secondary | ICD-10-CM | POA: Diagnosis not present

## 2016-03-30 DIAGNOSIS — M1612 Unilateral primary osteoarthritis, left hip: Secondary | ICD-10-CM | POA: Diagnosis not present

## 2016-03-30 DIAGNOSIS — I10 Essential (primary) hypertension: Secondary | ICD-10-CM | POA: Diagnosis not present

## 2016-03-30 DIAGNOSIS — Z96641 Presence of right artificial hip joint: Secondary | ICD-10-CM | POA: Diagnosis not present

## 2016-03-30 DIAGNOSIS — J45909 Unspecified asthma, uncomplicated: Secondary | ICD-10-CM | POA: Diagnosis not present

## 2016-03-30 DIAGNOSIS — F329 Major depressive disorder, single episode, unspecified: Secondary | ICD-10-CM | POA: Diagnosis not present

## 2016-03-30 DIAGNOSIS — M4806 Spinal stenosis, lumbar region: Secondary | ICD-10-CM | POA: Diagnosis not present

## 2016-04-01 ENCOUNTER — Emergency Department (HOSPITAL_COMMUNITY)
Admission: EM | Admit: 2016-04-01 | Discharge: 2016-04-01 | Disposition: A | Discharge status: Home / Self Care | Payer: BLUE CROSS/BLUE SHIELD | Attending: Emergency Medicine | Admitting: Emergency Medicine

## 2016-04-01 ENCOUNTER — Emergency Department (HOSPITAL_COMMUNITY): Payer: BLUE CROSS/BLUE SHIELD

## 2016-04-01 ENCOUNTER — Encounter (HOSPITAL_COMMUNITY): Payer: Self-pay | Admitting: *Deleted

## 2016-04-01 DIAGNOSIS — Z471 Aftercare following joint replacement surgery: Secondary | ICD-10-CM | POA: Diagnosis not present

## 2016-04-01 DIAGNOSIS — R0789 Other chest pain: Secondary | ICD-10-CM | POA: Insufficient documentation

## 2016-04-01 DIAGNOSIS — Z96641 Presence of right artificial hip joint: Secondary | ICD-10-CM | POA: Diagnosis not present

## 2016-04-01 DIAGNOSIS — M4806 Spinal stenosis, lumbar region: Secondary | ICD-10-CM | POA: Diagnosis not present

## 2016-04-01 DIAGNOSIS — J45909 Unspecified asthma, uncomplicated: Secondary | ICD-10-CM | POA: Insufficient documentation

## 2016-04-01 DIAGNOSIS — M1612 Unilateral primary osteoarthritis, left hip: Secondary | ICD-10-CM | POA: Diagnosis not present

## 2016-04-01 DIAGNOSIS — Z7982 Long term (current) use of aspirin: Secondary | ICD-10-CM | POA: Diagnosis not present

## 2016-04-01 DIAGNOSIS — I10 Essential (primary) hypertension: Secondary | ICD-10-CM | POA: Diagnosis not present

## 2016-04-01 DIAGNOSIS — M5412 Radiculopathy, cervical region: Secondary | ICD-10-CM | POA: Diagnosis not present

## 2016-04-01 DIAGNOSIS — Z87891 Personal history of nicotine dependence: Secondary | ICD-10-CM | POA: Diagnosis not present

## 2016-04-01 DIAGNOSIS — F329 Major depressive disorder, single episode, unspecified: Secondary | ICD-10-CM | POA: Diagnosis not present

## 2016-04-01 DIAGNOSIS — R079 Chest pain, unspecified: Secondary | ICD-10-CM | POA: Diagnosis not present

## 2016-04-01 DIAGNOSIS — R0602 Shortness of breath: Secondary | ICD-10-CM | POA: Diagnosis not present

## 2016-04-01 DIAGNOSIS — D869 Sarcoidosis, unspecified: Secondary | ICD-10-CM | POA: Diagnosis not present

## 2016-04-01 LAB — LIPASE, BLOOD: Lipase: 19 U/L (ref 11–51)

## 2016-04-01 LAB — BASIC METABOLIC PANEL
ANION GAP: 10 (ref 5–15)
BUN: 13 mg/dL (ref 6–20)
CHLORIDE: 101 mmol/L (ref 101–111)
CO2: 26 mmol/L (ref 22–32)
Calcium: 9.3 mg/dL (ref 8.9–10.3)
Creatinine, Ser: 0.69 mg/dL (ref 0.44–1.00)
GFR calc Af Amer: >60 mL/min (ref >60)
GLUCOSE: 95 mg/dL (ref 65–99)
POTASSIUM: 5 mmol/L (ref 3.5–5.1)
Sodium: 137 mmol/L (ref 135–145)

## 2016-04-01 LAB — CBC
HEMATOCRIT: 32.3 % — AB (ref 36.0–46.0)
HEMOGLOBIN: 10.4 g/dL — AB (ref 12.0–15.0)
MCH: 27 pg (ref 26.0–34.0)
MCHC: 32.2 g/dL (ref 30.0–36.0)
MCV: 83.9 fL (ref 78.0–100.0)
Platelets: 302 10*3/uL (ref 150–400)
RBC: 3.85 MIL/uL — ABNORMAL LOW (ref 3.87–5.11)
RDW: 12.7 % (ref 11.5–15.5)
WBC: 6.6 10*3/uL (ref 4.0–10.5)

## 2016-04-01 LAB — I-STAT TROPONIN, ED: Troponin i, poc: 0 ng/mL (ref 0.00–0.08)

## 2016-04-01 LAB — TROPONIN I

## 2016-04-01 LAB — D-DIMER, QUANTITATIVE: D-Dimer, Quant: 2.41 ug/mL-FEU — ABNORMAL HIGH (ref 0.00–0.50)

## 2016-04-01 MED ORDER — IOPAMIDOL (ISOVUE-370) INJECTION 76%
INTRAVENOUS | Status: AC
  Administered 2016-04-01: 100 mL
  Filled 2016-04-01: qty 100

## 2016-04-01 MED ORDER — GI COCKTAIL ~~LOC~~
30.0000 mL | Freq: Once | ORAL | Status: AC
  Administered 2016-04-01: 30 mL via ORAL
  Filled 2016-04-01: qty 30

## 2016-04-01 NOTE — ED Notes (Signed)
Pt informed of purpose of all additional labs ordered.  Family member very upset about delays in care.  Feels we have done nothing for patient.  This RN explained all efforts thus far.  Patient voiced appreciation.  Blood will be collected, and will speak to MD about reflux medication.  Pt sts she took home oxycodone for pain.  Will inform MD.

## 2016-04-01 NOTE — ED Notes (Signed)
EDP at bedside  

## 2016-04-01 NOTE — ED Notes (Signed)
Pt began to c/o right sided CP, constant, squeezing.  MD made aware and CT sts patient can be brought over.  Pt transported to CT by RN.

## 2016-04-01 NOTE — ED Provider Notes (Signed)
[facility] DEPT Provider Note   CSN: [id number] Arrival date & time: 04/01/16  1320     History   Chief Complaint Chief Complaint  Patient presents with  . Chest Pain    HPI Stephanie Andrade is a 60 y.o. female.  Pt had a hip replacement a week ago.  She was sitting on the toilet this morning getting a sponge bath.  She developed some epigastric and chest pain.  The pt said that it happened around 0900.  The pt said that she told her orthopedist who told her to come to the ED for evaluation.  Pt said the pain has not come back.  Pt is not on any blood thinners after her surgery, just ASA.      Past Medical History:  Diagnosis Date  . Arthritis   . Asthma   . Depression   . Family history of adverse reaction to anesthesia    sister, brother - PONV  . Family history of ovarian cancer   . Fibroids   . History of anemia   . History of bronchitis   . History of pneumonia   . History of sarcoidosis   . Hypertension   . PONV (postoperative nausea and vomiting) 03/26/2016  . Wears glasses     Patient Active Problem List   Diagnosis Date Noted  . Status post total replacement of right hip 03/24/2016  . Osteoarthritis of left hip 11/26/2015  . Impaired glucose tolerance 12/09/2014  . Plantar fascia rupture 06/16/2012  . History of vitamin D deficiency 11/15/2011  . History of sarcoidosis 11/15/2011  . Hot flashes not due to menopause 07/19/2011  . Spinal stenosis, lumbar region, with neurogenic claudication 03/10/2011  . Hypertension 02/11/2011  . Asthma 02/11/2011  . Morbid obesity ([place]) 02/11/2011  . Depression 02/11/2011  . Cervical radiculopathy 12/04/2010  . SHOULDER PAIN, BILATERAL 10/01/2010  . WRIST PAIN, BILATERAL 10/01/2010  . LUMBAGO 10/01/2010  . Osteoarthritis of right hip 07/17/2009  . GREATER TROCHANTERIC BURSITIS 07/17/2009    Past Surgical History:  Procedure Laterality Date  . COLONOSCOPY    . ESOPHAGOGASTRODUODENOSCOPY    . JOINT  REPLACEMENT    . 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   Procedure: RIGHT TOTAL HIP ARTHROPLASTY ANTERIOR APPROACH;  Surgeon: Mcarthur Rossetti, MD;  Location: [facility];  Service: Orthopedics;  Laterality: Right;    OB History    No data available       Home Medications    Prior to Admission medications   Medication Sig Start Date End Date Taking? Authorizing Provider  acetaminophen (TYLENOL) 500 MG tablet Take 1,000 mg by mouth every 8 (eight) hours as needed for mild pain or moderate pain.    Yes Historical Provider, MD  albuterol (PROVENTIL HFA;VENTOLIN HFA) 108 (90 Base) MCG/ACT inhaler Inhale 2 puffs into the lungs every 6 (six) hours as needed for wheezing or shortness of breath. 09/30/15  Yes Elby Showers, MD  aspirin EC 325 MG EC tablet Take 1 tablet (325 mg total) by mouth 2 (two) times daily after a meal. 03/27/16  Yes Mcarthur Rossetti, MD  buPROPion [facility] SR) 150 MG 12 hr tablet Take 150 mg by mouth 2 (two) times daily.   Yes Historical Provider, MD  cholecalciferol (VITAMIN D) 1000 units tablet Take 1,000 Units by mouth daily.   Yes Historical Provider, MD  losartan (COZAAR) 50 MG tablet Take 1 tablet (50 mg total) by mouth daily. 10/11/15  Yes Elby Showers, MD  methocarbamol (ROBAXIN) 500 MG tablet Take 1 tablet (500 mg total) by mouth every 6 (six) hours as needed for muscle spasms. 03/27/16  Yes Mcarthur Rossetti, MD  Omega-3 Fatty Acids (FISH OIL) 1000 MG CAPS Take 1,000 mg by mouth daily.   Yes Historical Provider, MD  oxyCODONE-acetaminophen (PERCOCET/ROXICET) 5-325 MG tablet Take 1-2 tablets by mouth every 4 (four) hours as needed for severe pain. 03/27/16  Yes Mcarthur Rossetti, MD  Turmeric 500 MG TABS Take 500 mg by mouth daily.   Yes Historical Provider, MD  zolpidem (AMBIEN) 5 MG tablet TAKE 1 TABLET AT BEDTIME AS NEEDED FOR SLEEP. 03/16/16  Yes Elby Showers, MD  CONTRAVE 8-90 MG TB12 Take 1 tab twice daily Patient taking differently: Take 1  tablet by mouth daily. Take 1 tab twice daily 12/09/15   Elby Showers, MD  diazepam (VALIUM) 5 MG tablet For muscle spasm Patient not taking: Reported on 03/16/2016 03/16/13   Stefanie Libel, MD  meloxicam (MOBIC) 15 MG tablet Take 15 mg by mouth daily. 02/25/16   Historical Provider, MD    Family History Family History  Problem Relation Age of Onset  . Cancer Mother   . Arthritis Father   . Hypertension Father   . Arthritis Sister   . Hypertension Brother     Social History Social History  Substance Use Topics  . Smoking status: Former Smoker    Types: Cigarettes    Quit date: 06/20/2009  . Smokeless tobacco: Never Used     Comment: "smoked 2 weeks out of the year for about 15 years"   . Alcohol use Yes     Comment: socially     Allergies   Mushroom extract complex; Codeine; Dextromethorphan; Levaquin [levofloxacin in d5w]; Tape; and Tessalon perles   Review of Systems Review of Systems  Cardiovascular: Positive for chest pain.  All other systems reviewed and are negative.    Physical Exam Updated Vital Signs BP 102/90   Pulse 83   Temp 98.4 F (36.9 C) (Oral)   Resp 13   LMP 04/06/2011   SpO2 100%   Physical Exam  Constitutional: She is oriented to person, place, and time. She appears well-developed and well-nourished.  HENT:  Head: Normocephalic and atraumatic.  Right Ear: External ear normal.  Left Ear: External ear normal.  Nose: Nose normal.  Mouth/Throat: Oropharynx is clear and moist.  Eyes: Conjunctivae and EOM are normal. Pupils are equal, round, and reactive to light.  Neck: Normal range of motion. Neck supple.  Cardiovascular: Normal rate, regular rhythm, normal heart sounds and intact distal pulses.   Pulmonary/Chest: Effort normal and breath sounds normal.  Abdominal: Soft. Bowel sounds are normal.  Musculoskeletal: Normal range of motion.  Neurological: She is alert and oriented to person, place, and time.  Skin: Skin is warm and dry.    Psychiatric: She has a normal mood and affect. Her behavior is normal. Judgment and thought content normal.  Nursing note and vitals reviewed.    ED Treatments / Results  Labs (all labs ordered are listed, but only abnormal results are displayed) Labs Reviewed  CBC - Abnormal; Notable for the following:       Result Value   RBC 3.85 (*)    Hemoglobin 10.4 (*)    HCT 32.3 (*)    All other components within normal limits  D-DIMER, QUANTITATIVE (NOT AT [facility]) - Abnormal; Notable for the following:    D-Dimer, Quant 2.41 (*)  All other components within normal limits  BASIC METABOLIC PANEL  LIPASE, BLOOD  TROPONIN I  I-STAT TROPOININ, ED    EKG  EKG Interpretation  Date/Time:  Wednesday April 01 2016 13:25:14 EDT Ventricular Rate:  89 PR Interval:  104 QRS Duration: 84 QT Interval:  336 QTC Calculation: 408 R Axis:   57 Text Interpretation:  Sinus rhythm with short PR Otherwise normal ECG Confirmed by Gilford Raid MD, Bueford Arp ([id number]) on 04/01/2016 4:38:14 PM       Radiology Dg Chest 2 View  Result Date: 04/01/2016 CLINICAL DATA:  Chest pain EXAM: CHEST  2 VIEW COMPARISON:  03/21/2015 FINDINGS: Normal heart size and mediastinal contours. Subtle biapical nodular densities correlate with EKG pads seen laterally. No acute infiltrate or edema. No effusion or pneumothorax. No acute osseous findings. IMPRESSION: No active cardiopulmonary disease. Electronically Signed   By: Monte Fantasia M.D.   On: 04/01/2016 14:01   Ct Angio Chest Pe W And/or Wo Contrast  Result Date: 04/01/2016 CLINICAL DATA:  60 year old female with shortness of breath and chest pain today. Hip surgery 1 week ago. Initial encounter. EXAM: CT ANGIOGRAPHY CHEST WITH CONTRAST TECHNIQUE: Multidetector CT imaging of the chest was performed using the standard protocol during bolus administration of intravenous contrast. Multiplanar CT image reconstructions and MIPs were obtained to evaluate the vascular anatomy.  CONTRAST:  80 mL Isovue 370 COMPARISON:  Chest radiographs 1400 hours today and earlier. FINDINGS: Adequate contrast bolus timing in the pulmonary arterial tree. Lower lobe respiratory motion artifact. No focal filling defect identified in the pulmonary arteries to suggest acute pulmonary embolism, although arterial branches in both costophrenic angles are not well evaluated. Major airways are patent.  Mild dependent pulmonary atelectasis. No pleural or pericardial effusion. Negative visualized aorta. Negative thoracic inlet. No thoracic lymphadenopathy. Visualized spleen is at the upper limits of normal. Negative visualized liver, gallbladder, kidneys, and bowel in the upper abdomen. There is a round 2.6 cm left adrenal nodule with fairly low density (15 Hounsfield units). The right adrenal gland is normal. No acute osseous abnormality identified. Review of the MIP images confirms the above findings. IMPRESSION: 1. No evidence of acute pulmonary embolus, although distal lower lobe branches are suboptimally evaluated due to respiratory motion. 2. Mild pulmonary atelectasis. No other acute findings in the chest. 3. **An incidental finding of potential clinical significance has been found. Left adrenal 2.6 cm nodule. This is indeterminate but probably benign (15 HU on this exam). Guidelines recommend a nonemergent Adrenal Protocol CT to characterize further. This recommendation follows ACR consensus guidelines: Managing Incidental Findings on Abdominal CT: White Paper of the ACR Incidental Findings Committee. [facility] [id number]** Electronically Signed   By: Genevie Ann M.D.   On: 04/01/2016 20:37    Procedures Procedures (including critical care time)  Medications Ordered in ED Medications  gi cocktail (Maalox,Lidocaine,Donnatal) (30 mLs Oral Given 04/01/16 1817)  iopamidol (ISOVUE-370) 76 % injection (100 mLs  Contrast Given 04/01/16 2003)     Initial Impression / Assessment and Plan / ED Course    I have reviewed the triage vital signs and the nursing notes.  Pertinent labs & imaging results that were available during my care of the patient were reviewed by me and considered in my medical decision making (see chart for details).  Clinical Course    Pt has had 2 sets of negative troponins.  CT angio was negative.  Pt ok for d/c.  She knows to return if worse and f/u with  pcp.  Final Clinical Impressions(s) / ED Diagnoses   Final diagnoses:  Other chest pain    New Prescriptions New Prescriptions   No medications on file     Isla Pence, MD 04/01/16 2109

## 2016-04-01 NOTE — ED Notes (Signed)
This RN attempted IV x 2.  2nd RN at bedside at this time

## 2016-04-01 NOTE — ED Notes (Signed)
MD at bedside updating pt

## 2016-04-01 NOTE — ED Triage Notes (Signed)
Pt c/o mid non radiating CP onset today @ 9am lasting x 1 hr, pt denies CP at this time, pt denies SOB, n/v/d with episodes, pt c/o GERD at this time, pt had R hip replacement x 8 days ago, pt A&O x4

## 2016-04-01 NOTE — ED Notes (Addendum)
Patient able to ambulate independently with walker. At baseline post-surgery

## 2016-04-03 DIAGNOSIS — M4806 Spinal stenosis, lumbar region: Secondary | ICD-10-CM | POA: Diagnosis not present

## 2016-04-03 DIAGNOSIS — Z96641 Presence of right artificial hip joint: Secondary | ICD-10-CM | POA: Diagnosis not present

## 2016-04-03 DIAGNOSIS — Z471 Aftercare following joint replacement surgery: Secondary | ICD-10-CM | POA: Diagnosis not present

## 2016-04-03 DIAGNOSIS — F329 Major depressive disorder, single episode, unspecified: Secondary | ICD-10-CM | POA: Diagnosis not present

## 2016-04-03 DIAGNOSIS — Z87891 Personal history of nicotine dependence: Secondary | ICD-10-CM | POA: Diagnosis not present

## 2016-04-03 DIAGNOSIS — M1612 Unilateral primary osteoarthritis, left hip: Secondary | ICD-10-CM | POA: Diagnosis not present

## 2016-04-03 DIAGNOSIS — J45909 Unspecified asthma, uncomplicated: Secondary | ICD-10-CM | POA: Diagnosis not present

## 2016-04-03 DIAGNOSIS — I10 Essential (primary) hypertension: Secondary | ICD-10-CM | POA: Diagnosis not present

## 2016-04-03 DIAGNOSIS — M5412 Radiculopathy, cervical region: Secondary | ICD-10-CM | POA: Diagnosis not present

## 2016-04-03 DIAGNOSIS — D869 Sarcoidosis, unspecified: Secondary | ICD-10-CM | POA: Diagnosis not present

## 2016-04-06 DIAGNOSIS — M5412 Radiculopathy, cervical region: Secondary | ICD-10-CM | POA: Diagnosis not present

## 2016-04-06 DIAGNOSIS — Z87891 Personal history of nicotine dependence: Secondary | ICD-10-CM | POA: Diagnosis not present

## 2016-04-06 DIAGNOSIS — Z96641 Presence of right artificial hip joint: Secondary | ICD-10-CM | POA: Diagnosis not present

## 2016-04-06 DIAGNOSIS — Z471 Aftercare following joint replacement surgery: Secondary | ICD-10-CM | POA: Diagnosis not present

## 2016-04-06 DIAGNOSIS — M1612 Unilateral primary osteoarthritis, left hip: Secondary | ICD-10-CM | POA: Diagnosis not present

## 2016-04-06 DIAGNOSIS — M4806 Spinal stenosis, lumbar region: Secondary | ICD-10-CM | POA: Diagnosis not present

## 2016-04-06 DIAGNOSIS — J45909 Unspecified asthma, uncomplicated: Secondary | ICD-10-CM | POA: Diagnosis not present

## 2016-04-06 DIAGNOSIS — D869 Sarcoidosis, unspecified: Secondary | ICD-10-CM | POA: Diagnosis not present

## 2016-04-06 DIAGNOSIS — F329 Major depressive disorder, single episode, unspecified: Secondary | ICD-10-CM | POA: Diagnosis not present

## 2016-04-06 DIAGNOSIS — I10 Essential (primary) hypertension: Secondary | ICD-10-CM | POA: Diagnosis not present

## 2016-04-23 ENCOUNTER — Telehealth: Payer: Self-pay | Admitting: Internal Medicine

## 2016-04-23 MED ORDER — BUPROPION HCL ER (XL) 150 MG PO TB24: 150.0000 mg | ORAL_TABLET | Freq: Every day | ORAL | 0 refills | Status: DC

## 2016-04-23 NOTE — Telephone Encounter (Signed)
Spoke to patient Gave instructions. Patient agreed. Sent Rx.

## 2016-04-23 NOTE — Telephone Encounter (Signed)
Had hip replacement 4 1/2 weeks ago.  You have her on Contrave.  She is currently on Oxycodone for pain.  She states that she needs something else while she is on the Oxycodone due to the blocker to opioids in Oxycodone.    She doesn't know how much longer she may need the Oxycodone.  She uses it mostly at night for the pain.  However, she can tell a difference being off of the Wellbutrin.  States that she has had some "low" days here lately.  She would like to get back on something as soon as she can.    She did request if you do Wellbutrin that if you could do 75mg  bid...states that she was last on 300mg  XL and she doesn't feel she needs quite that much.    Please advise how you would like her to proceed.    Pharmacy:  [facility] contact #:  [phone]

## 2016-04-23 NOTE — Telephone Encounter (Signed)
Called patient. No answer. Will try later.  

## 2016-04-23 NOTE — Telephone Encounter (Signed)
She needs to restart Wellbutrin however her request for 75 bid is not reasonable. We can do 150 mg XL daily instead of 300 mg daily

## 2016-04-30 DIAGNOSIS — Z96641 Presence of right artificial hip joint: Secondary | ICD-10-CM | POA: Diagnosis not present

## 2016-05-18 ENCOUNTER — Other Ambulatory Visit: Payer: Self-pay | Admitting: Internal Medicine

## 2016-05-25 ENCOUNTER — Other Ambulatory Visit: Payer: Self-pay | Admitting: Internal Medicine

## 2016-05-26 ENCOUNTER — Other Ambulatory Visit: Payer: Self-pay | Admitting: Internal Medicine

## 2016-06-03 ENCOUNTER — Ambulatory Visit (INDEPENDENT_AMBULATORY_CARE_PROVIDER_SITE_OTHER): Payer: BLUE CROSS/BLUE SHIELD | Admitting: Orthopaedic Surgery

## 2016-06-03 DIAGNOSIS — M1611 Unilateral primary osteoarthritis, right hip: Secondary | ICD-10-CM

## 2016-06-17 DIAGNOSIS — N951 Menopausal and female climacteric states: Secondary | ICD-10-CM | POA: Diagnosis not present

## 2016-06-17 DIAGNOSIS — Z8041 Family history of malignant neoplasm of ovary: Secondary | ICD-10-CM | POA: Diagnosis not present

## 2016-06-17 DIAGNOSIS — R8290 Unspecified abnormal findings in urine: Secondary | ICD-10-CM | POA: Diagnosis not present

## 2016-06-17 DIAGNOSIS — Z6838 Body mass index (BMI) 38.0-38.9, adult: Secondary | ICD-10-CM | POA: Diagnosis not present

## 2016-06-17 DIAGNOSIS — Z124 Encounter for screening for malignant neoplasm of cervix: Secondary | ICD-10-CM | POA: Diagnosis not present

## 2016-06-17 DIAGNOSIS — Z01419 Encounter for gynecological examination (general) (routine) without abnormal findings: Secondary | ICD-10-CM | POA: Diagnosis not present

## 2016-06-17 DIAGNOSIS — Z1389 Encounter for screening for other disorder: Secondary | ICD-10-CM | POA: Diagnosis not present

## 2016-06-17 DIAGNOSIS — Z1231 Encounter for screening mammogram for malignant neoplasm of breast: Secondary | ICD-10-CM | POA: Diagnosis not present

## 2016-06-17 DIAGNOSIS — Z130 Encounter for screening for diseases of the blood and blood-forming organs and certain disorders involving the immune mechanism: Secondary | ICD-10-CM | POA: Diagnosis not present

## 2016-06-18 DIAGNOSIS — Z1151 Encounter for screening for human papillomavirus (HPV): Secondary | ICD-10-CM | POA: Diagnosis not present

## 2016-06-18 DIAGNOSIS — Z124 Encounter for screening for malignant neoplasm of cervix: Secondary | ICD-10-CM | POA: Diagnosis not present

## 2016-06-30 ENCOUNTER — Other Ambulatory Visit: Payer: Self-pay | Admitting: Internal Medicine

## 2016-06-30 ENCOUNTER — Telehealth: Payer: Self-pay | Admitting: Internal Medicine

## 2016-06-30 MED ORDER — NALTREXONE-BUPROPION HCL ER 8-90 MG PO TB12: 1.0000 | ORAL_TABLET | Freq: Two times a day (BID) | ORAL | 0 refills | Status: DC

## 2016-06-30 NOTE — Telephone Encounter (Signed)
Spoke with patient about refill request.  Advised her she needs to be seen.  Appointment made and contrave sent to pharmacy.

## 2016-06-30 NOTE — Telephone Encounter (Signed)
Pt has not been seen since April. Needs OV if wants these refilled

## 2016-07-06 ENCOUNTER — Encounter: Payer: Self-pay | Admitting: Internal Medicine

## 2016-07-06 ENCOUNTER — Ambulatory Visit (INDEPENDENT_AMBULATORY_CARE_PROVIDER_SITE_OTHER): Payer: BLUE CROSS/BLUE SHIELD | Admitting: Internal Medicine

## 2016-07-06 VITALS — BP 110/64 | HR 70 | Temp 98.2°F | Ht 68.0 in | Wt 252.0 lb

## 2016-07-06 DIAGNOSIS — M545 Low back pain, unspecified: Secondary | ICD-10-CM

## 2016-07-06 DIAGNOSIS — G8929 Other chronic pain: Secondary | ICD-10-CM | POA: Diagnosis not present

## 2016-07-06 DIAGNOSIS — Z8659 Personal history of other mental and behavioral disorders: Secondary | ICD-10-CM | POA: Diagnosis not present

## 2016-07-06 DIAGNOSIS — I10 Essential (primary) hypertension: Secondary | ICD-10-CM

## 2016-07-06 DIAGNOSIS — Z6838 Body mass index (BMI) 38.0-38.9, adult: Secondary | ICD-10-CM | POA: Diagnosis not present

## 2016-07-06 DIAGNOSIS — R7302 Impaired glucose tolerance (oral): Secondary | ICD-10-CM

## 2016-07-06 DIAGNOSIS — G4709 Other insomnia: Secondary | ICD-10-CM

## 2016-07-06 NOTE — Progress Notes (Signed)
Subjective:    Patient ID: Stephanie Andrade, female    DOB: 10-05-1955, 60 y.o.   MRN: [id number]  HPI   60 year old Female for follow up of obesity. Had to get Contrave reapproved recently. She was off Contrave due to hip replacement surgery as she was taken narcotic pain medication which is contraindicated with Contrave.  She had right hip replacement by Dr. Magnus Andrade August 8 of this year. She return to the emergency department August 16 with chest pain. She had a positive d-dimer but no evidence of acute pulmonary embolus.  Apparently has a 2.6 left adrenal nodule that may need further evaluation. It is felt to be benign.  Not really motivated to diet and exercise. Somewhat restricted due to musculoskeletal pain with chronic back pain.  Seems depressed    Review of Systems no new complaints     Objective:   Physical Exam  Constitutional: She appears well-developed and well-nourished.  Musculoskeletal: She exhibits no edema.  Chest clear. Cardiac exam regular rate and rhythm. Neck supple without thyromegaly. 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

## 2016-07-16 NOTE — Patient Instructions (Signed)
Restart  Contrave and follow-up mid December

## 2016-08-03 ENCOUNTER — Ambulatory Visit (INDEPENDENT_AMBULATORY_CARE_PROVIDER_SITE_OTHER): Payer: BLUE CROSS/BLUE SHIELD | Admitting: Internal Medicine

## 2016-08-03 ENCOUNTER — Encounter: Payer: Self-pay | Admitting: Internal Medicine

## 2016-08-03 VITALS — BP 118/78 | HR 79 | Temp 98.1°F | Ht 68.0 in | Wt 251.0 lb

## 2016-08-03 DIAGNOSIS — E6609 Other obesity due to excess calories: Secondary | ICD-10-CM | POA: Diagnosis not present

## 2016-08-03 DIAGNOSIS — Z8659 Personal history of other mental and behavioral disorders: Secondary | ICD-10-CM | POA: Diagnosis not present

## 2016-08-03 DIAGNOSIS — Z6838 Body mass index (BMI) 38.0-38.9, adult: Secondary | ICD-10-CM | POA: Diagnosis not present

## 2016-08-03 DIAGNOSIS — G4709 Other insomnia: Secondary | ICD-10-CM

## 2016-08-03 DIAGNOSIS — IMO0001 Reserved for inherently not codable concepts without codable children: Secondary | ICD-10-CM

## 2016-08-03 MED ORDER — ZOLPIDEM TARTRATE 5 MG PO TABS: 5.0000 mg | ORAL_TABLET | Freq: Every evening | ORAL | 2 refills | Status: DC | PRN

## 2016-08-06 ENCOUNTER — Other Ambulatory Visit: Payer: Self-pay | Admitting: Internal Medicine

## 2016-08-16 NOTE — Patient Instructions (Signed)
Continue diet exercise and weight loss efforts. Ambien refill. Schedule for physical exam late February 2018. Try Weight Watchers.

## 2016-08-16 NOTE — Progress Notes (Signed)
Subjective:    Patient ID: Stephanie Andrade, female    DOB: 04-12-1956, 60 y.o.   MRN: [id number]  HPI She is here today to follow-up on weight loss efforts. At last visit 07/06/2016 she weighed 252 pounds. We had to get prior authorization for contrite to be reapproved. She was off contrite due to hip replacement surgery as she was taking narcotic pain medication which is contraindicated with contrast. Her if she had right hip replacement August 8 of this year.  Says that she has been trying to do Weight Watchers but hasn't been very successful recently.  Seems a bit depressed. Not sure what is causing lack of motivation.  She may be a candidate for the obesity clinic at [facility].  Now weighs 251 pounds  Hemoglobin A1c in February was 5.9%. History of impaired glucose tolerance. Lipid family that time was normal except for low HDL cholesterol.   Review of Systems see above     Objective:   Physical Exam  Spent 20 minutes making with her about diet exercise and weight loss issues.      Assessment & Plan:  History of insomnia-needs refill on Ambien. Given #30 with 2 refills.  BMI greater than 38. Continue diet exercise and weight loss efforts. Follow-up with physical exam in late February 2018.  History of depression  Status post hip replacement

## 2016-09-04 ENCOUNTER — Other Ambulatory Visit: Payer: Self-pay | Admitting: Internal Medicine

## 2016-09-04 NOTE — Telephone Encounter (Signed)
RX faxed

## 2016-09-20 ENCOUNTER — Encounter (HOSPITAL_COMMUNITY): Payer: Self-pay | Admitting: Emergency Medicine

## 2016-09-20 ENCOUNTER — Inpatient Hospital Stay (HOSPITAL_COMMUNITY)
Admission: EM | Admit: 2016-09-20 | Discharge: 2016-09-23 | DRG: 419 | Disposition: A | Discharge status: Home / Self Care | Payer: BLUE CROSS/BLUE SHIELD | Attending: Internal Medicine | Admitting: Internal Medicine

## 2016-09-20 ENCOUNTER — Emergency Department (HOSPITAL_COMMUNITY): Payer: BLUE CROSS/BLUE SHIELD

## 2016-09-20 ENCOUNTER — Ambulatory Visit (INDEPENDENT_AMBULATORY_CARE_PROVIDER_SITE_OTHER)
Admission: EM | Admit: 2016-09-20 | Discharge: 2016-09-20 | Disposition: A | Discharge status: Home / Self Care | Payer: BLUE CROSS/BLUE SHIELD | Source: Home / Self Care

## 2016-09-20 ENCOUNTER — Inpatient Hospital Stay (HOSPITAL_COMMUNITY): Payer: BLUE CROSS/BLUE SHIELD

## 2016-09-20 DIAGNOSIS — F329 Major depressive disorder, single episode, unspecified: Secondary | ICD-10-CM | POA: Diagnosis not present

## 2016-09-20 DIAGNOSIS — K8010 Calculus of gallbladder with chronic cholecystitis without obstruction: Secondary | ICD-10-CM | POA: Diagnosis not present

## 2016-09-20 DIAGNOSIS — J4520 Mild intermittent asthma, uncomplicated: Secondary | ICD-10-CM | POA: Diagnosis not present

## 2016-09-20 DIAGNOSIS — K8001 Calculus of gallbladder with acute cholecystitis with obstruction: Secondary | ICD-10-CM | POA: Diagnosis not present

## 2016-09-20 DIAGNOSIS — D3502 Benign neoplasm of left adrenal gland: Secondary | ICD-10-CM | POA: Diagnosis not present

## 2016-09-20 DIAGNOSIS — Z87891 Personal history of nicotine dependence: Secondary | ICD-10-CM

## 2016-09-20 DIAGNOSIS — K831 Obstruction of bile duct: Secondary | ICD-10-CM | POA: Diagnosis not present

## 2016-09-20 DIAGNOSIS — Z79899 Other long term (current) drug therapy: Secondary | ICD-10-CM | POA: Diagnosis not present

## 2016-09-20 DIAGNOSIS — R5381 Other malaise: Secondary | ICD-10-CM | POA: Diagnosis present

## 2016-09-20 DIAGNOSIS — R1011 Right upper quadrant pain: Secondary | ICD-10-CM | POA: Diagnosis not present

## 2016-09-20 DIAGNOSIS — E669 Obesity, unspecified: Secondary | ICD-10-CM | POA: Diagnosis present

## 2016-09-20 DIAGNOSIS — Z7982 Long term (current) use of aspirin: Secondary | ICD-10-CM

## 2016-09-20 DIAGNOSIS — R52 Pain, unspecified: Secondary | ICD-10-CM

## 2016-09-20 DIAGNOSIS — Z419 Encounter for procedure for purposes other than remedying health state, unspecified: Secondary | ICD-10-CM

## 2016-09-20 DIAGNOSIS — K8050 Calculus of bile duct without cholangitis or cholecystitis without obstruction: Secondary | ICD-10-CM

## 2016-09-20 DIAGNOSIS — K8020 Calculus of gallbladder without cholecystitis without obstruction: Secondary | ICD-10-CM | POA: Diagnosis not present

## 2016-09-20 DIAGNOSIS — D649 Anemia, unspecified: Secondary | ICD-10-CM | POA: Diagnosis not present

## 2016-09-20 DIAGNOSIS — Z8261 Family history of arthritis: Secondary | ICD-10-CM | POA: Diagnosis not present

## 2016-09-20 DIAGNOSIS — R1013 Epigastric pain: Secondary | ICD-10-CM | POA: Diagnosis not present

## 2016-09-20 DIAGNOSIS — J45909 Unspecified asthma, uncomplicated: Secondary | ICD-10-CM | POA: Diagnosis present

## 2016-09-20 DIAGNOSIS — Z791 Long term (current) use of non-steroidal anti-inflammatories (NSAID): Secondary | ICD-10-CM | POA: Diagnosis not present

## 2016-09-20 DIAGNOSIS — K8051 Calculus of bile duct without cholangitis or cholecystitis with obstruction: Secondary | ICD-10-CM

## 2016-09-20 DIAGNOSIS — Z96641 Presence of right artificial hip joint: Secondary | ICD-10-CM | POA: Diagnosis not present

## 2016-09-20 DIAGNOSIS — Z8249 Family history of ischemic heart disease and other diseases of the circulatory system: Secondary | ICD-10-CM

## 2016-09-20 DIAGNOSIS — M1990 Unspecified osteoarthritis, unspecified site: Secondary | ICD-10-CM | POA: Diagnosis present

## 2016-09-20 DIAGNOSIS — Z8041 Family history of malignant neoplasm of ovary: Secondary | ICD-10-CM

## 2016-09-20 DIAGNOSIS — Z888 Allergy status to other drugs, medicaments and biological substances status: Secondary | ICD-10-CM | POA: Diagnosis not present

## 2016-09-20 DIAGNOSIS — Z9049 Acquired absence of other specified parts of digestive tract: Secondary | ICD-10-CM | POA: Diagnosis not present

## 2016-09-20 DIAGNOSIS — Z6838 Body mass index (BMI) 38.0-38.9, adult: Secondary | ICD-10-CM

## 2016-09-20 DIAGNOSIS — E860 Dehydration: Secondary | ICD-10-CM | POA: Diagnosis present

## 2016-09-20 DIAGNOSIS — K8000 Calculus of gallbladder with acute cholecystitis without obstruction: Secondary | ICD-10-CM | POA: Diagnosis not present

## 2016-09-20 DIAGNOSIS — R1084 Generalized abdominal pain: Secondary | ICD-10-CM | POA: Diagnosis not present

## 2016-09-20 DIAGNOSIS — I10 Essential (primary) hypertension: Secondary | ICD-10-CM | POA: Diagnosis present

## 2016-09-20 DIAGNOSIS — K8021 Calculus of gallbladder without cholecystitis with obstruction: Secondary | ICD-10-CM | POA: Diagnosis not present

## 2016-09-20 DIAGNOSIS — Z881 Allergy status to other antibiotic agents status: Secondary | ICD-10-CM | POA: Diagnosis not present

## 2016-09-20 LAB — CBC
HCT: 39.9 % (ref 36.0–46.0)
Hemoglobin: 13.3 g/dL (ref 12.0–15.0)
MCH: 27.3 pg (ref 26.0–34.0)
MCHC: 33.3 g/dL (ref 30.0–36.0)
MCV: 81.9 fL (ref 78.0–100.0)
PLATELETS: 270 10*3/uL (ref 150–400)
RBC: 4.87 MIL/uL (ref 3.87–5.11)
RDW: 13.4 % (ref 11.5–15.5)
WBC: 9.9 10*3/uL (ref 4.0–10.5)

## 2016-09-20 LAB — URINALYSIS, ROUTINE W REFLEX MICROSCOPIC
Bacteria, UA: NONE SEEN
Bilirubin Urine: NEGATIVE
Glucose, UA: NEGATIVE mg/dL
Hgb urine dipstick: NEGATIVE
Ketones, ur: 20 mg/dL — AB
Leukocytes, UA: NEGATIVE
Nitrite: NEGATIVE
Protein, ur: 30 mg/dL — AB
Specific Gravity, Urine: 1.023 (ref 1.005–1.030)
pH: 8 (ref 5.0–8.0)

## 2016-09-20 LAB — POCT URINALYSIS DIP (DEVICE)
Glucose, UA: 100 mg/dL — AB
Ketones, ur: 15 mg/dL — AB
Leukocytes, UA: NEGATIVE
Nitrite: NEGATIVE
PH: 7.5 (ref 5.0–8.0)
PROTEIN: 30 mg/dL — AB
SPECIFIC GRAVITY, URINE: 1.02 (ref 1.005–1.030)

## 2016-09-20 LAB — COMPREHENSIVE METABOLIC PANEL WITH GFR
ALT: 644 U/L — ABNORMAL HIGH (ref 14–54)
AST: 547 U/L — ABNORMAL HIGH (ref 15–41)
Albumin: 4.5 g/dL (ref 3.5–5.0)
Alkaline Phosphatase: 147 U/L — ABNORMAL HIGH (ref 38–126)
Anion gap: 15 (ref 5–15)
BUN: 20 mg/dL (ref 6–20)
CO2: 25 mmol/L (ref 22–32)
Calcium: 10.1 mg/dL (ref 8.9–10.3)
Chloride: 99 mmol/L — ABNORMAL LOW (ref 101–111)
Creatinine, Ser: 0.93 mg/dL (ref 0.44–1.00)
GFR calc Af Amer: >60 mL/min
GFR calc non Af Amer: >60 mL/min
Glucose, Bld: 135 mg/dL — ABNORMAL HIGH (ref 65–99)
Potassium: 4.6 mmol/L (ref 3.5–5.1)
Sodium: 139 mmol/L (ref 135–145)
Total Bilirubin: 2.8 mg/dL — ABNORMAL HIGH (ref 0.3–1.2)
Total Protein: 7.9 g/dL (ref 6.5–8.1)

## 2016-09-20 LAB — LIPASE, BLOOD: Lipase: 29 U/L (ref 11–51)

## 2016-09-20 MED ORDER — VITAMIN D3 250 MCG (10000 UT) PO TABS: 10000.0000 [IU] | ORAL_TABLET | Freq: Every day | ORAL | Status: DC

## 2016-09-20 MED ORDER — OMEGA-3-ACID ETHYL ESTERS 1 G PO CAPS
1.0000 g | ORAL_CAPSULE | Freq: Every day | ORAL | Status: DC
  Administered 2016-09-23: 1 g via ORAL
  Filled 2016-09-20: qty 1

## 2016-09-20 MED ORDER — ONDANSETRON 4 MG PO TBDP
4.0000 mg | ORAL_TABLET | Freq: Once | ORAL | Status: AC
  Administered 2016-09-20: 4 mg via ORAL

## 2016-09-20 MED ORDER — SODIUM CHLORIDE 0.9 % IV SOLN: INTRAVENOUS | Status: DC

## 2016-09-20 MED ORDER — ONDANSETRON HCL 4 MG/2ML IJ SOLN
4.0000 mg | Freq: Four times a day (QID) | INTRAMUSCULAR | Status: DC | PRN
  Administered 2016-09-22 (×2): 4 mg via INTRAVENOUS
  Filled 2016-09-20 (×2): qty 2

## 2016-09-20 MED ORDER — DEXTROSE IN LACTATED RINGERS 5 % IV SOLN
INTRAVENOUS | Status: DC
  Administered 2016-09-20 – 2016-09-22 (×5): via INTRAVENOUS

## 2016-09-20 MED ORDER — HYDROMORPHONE HCL 2 MG/ML IJ SOLN
1.0000 mg | Freq: Once | INTRAMUSCULAR | Status: AC
  Administered 2016-09-20: 1 mg via INTRAVENOUS
  Filled 2016-09-20: qty 1

## 2016-09-20 MED ORDER — ACETAMINOPHEN 650 MG RE SUPP: 650.0000 mg | Freq: Four times a day (QID) | RECTAL | Status: DC | PRN

## 2016-09-20 MED ORDER — TURMERIC 500 MG PO TABS: 500.0000 mg | ORAL_TABLET | Freq: Every day | ORAL | Status: DC

## 2016-09-20 MED ORDER — NALTREXONE-BUPROPION HCL ER 8-90 MG PO TB12: 1.0000 | ORAL_TABLET | Freq: Two times a day (BID) | ORAL | Status: DC

## 2016-09-20 MED ORDER — ONDANSETRON HCL 4 MG PO TABS: 4.0000 mg | ORAL_TABLET | Freq: Four times a day (QID) | ORAL | Status: DC | PRN

## 2016-09-20 MED ORDER — ONDANSETRON HCL 4 MG/2ML IJ SOLN: 4.0000 mg | Freq: Three times a day (TID) | INTRAMUSCULAR | Status: DC | PRN

## 2016-09-20 MED ORDER — METOCLOPRAMIDE HCL 5 MG/ML IJ SOLN: 10.0000 mg | Freq: Once | INTRAMUSCULAR | Status: DC

## 2016-09-20 MED ORDER — ZOLPIDEM TARTRATE 5 MG PO TABS: 5.0000 mg | ORAL_TABLET | Freq: Every evening | ORAL | Status: DC | PRN

## 2016-09-20 MED ORDER — ENOXAPARIN SODIUM 40 MG/0.4ML ~~LOC~~ SOLN
40.0000 mg | SUBCUTANEOUS | Status: DC
  Administered 2016-09-22: 40 mg via SUBCUTANEOUS
  Filled 2016-09-20 (×3): qty 0.4

## 2016-09-20 MED ORDER — ALPRAZOLAM 0.5 MG PO TABS
1.0000 mg | ORAL_TABLET | Freq: Once | ORAL | Status: AC
  Administered 2016-09-20: 1 mg via ORAL
  Filled 2016-09-20: qty 2

## 2016-09-20 MED ORDER — SODIUM CHLORIDE 0.9 % IV BOLUS (SEPSIS)
1000.0000 mL | Freq: Once | INTRAVENOUS | Status: AC
  Administered 2016-09-20: 1000 mL via INTRAVENOUS

## 2016-09-20 MED ORDER — HYDROMORPHONE HCL 2 MG/ML IJ SOLN: 1.0000 mg | INTRAMUSCULAR | Status: DC | PRN

## 2016-09-20 MED ORDER — OXYCODONE-ACETAMINOPHEN 5-325 MG PO TABS
1.0000 | ORAL_TABLET | Freq: Once | ORAL | Status: AC
  Administered 2016-09-20: 1 via ORAL

## 2016-09-20 MED ORDER — ACETAMINOPHEN 325 MG PO TABS: 650.0000 mg | ORAL_TABLET | Freq: Four times a day (QID) | ORAL | Status: DC | PRN

## 2016-09-20 MED ORDER — ONDANSETRON HCL 4 MG/2ML IJ SOLN
4.0000 mg | Freq: Once | INTRAMUSCULAR | Status: AC
  Administered 2016-09-20: 4 mg via INTRAVENOUS
  Filled 2016-09-20: qty 2

## 2016-09-20 MED ORDER — ALBUTEROL SULFATE (2.5 MG/3ML) 0.083% IN NEBU
3.0000 mL | INHALATION_SOLUTION | Freq: Four times a day (QID) | RESPIRATORY_TRACT | Status: DC | PRN
  Filled 2016-09-20: qty 3

## 2016-09-20 MED ORDER — FAMOTIDINE IN NACL 20-0.9 MG/50ML-% IV SOLN
20.0000 mg | Freq: Two times a day (BID) | INTRAVENOUS | Status: DC
  Administered 2016-09-20 – 2016-09-22 (×4): 20 mg via INTRAVENOUS
  Filled 2016-09-20 (×6): qty 50

## 2016-09-20 MED ORDER — MORPHINE SULFATE (PF) 2 MG/ML IV SOLN
1.0000 mg | INTRAVENOUS | Status: DC | PRN
  Administered 2016-09-21 (×2): 1 mg via INTRAVENOUS
  Filled 2016-09-20 (×2): qty 1

## 2016-09-20 MED ORDER — OXYCODONE-ACETAMINOPHEN 5-325 MG PO TABS
ORAL_TABLET | ORAL | Status: AC
  Filled 2016-09-20: qty 1

## 2016-09-20 MED ORDER — ONDANSETRON 4 MG PO TBDP
ORAL_TABLET | ORAL | Status: AC
  Filled 2016-09-20: qty 1

## 2016-09-20 NOTE — Progress Notes (Signed)
Called [facility] and spoke with the answering service to notify them of the pts admission.

## 2016-09-20 NOTE — Discharge Instructions (Signed)
I concerned you may have a problem with your gall bladder based on your physical exam. I do not have access to ultrasound or to CT here in clinic, nor do I have the ability to draw labs in a reasonable amount of time. Because of this, I am discharging you with strong advice to go to the ER for further work up and evaluation.

## 2016-09-20 NOTE — Consult Note (Signed)
Reason for Consult: Symptomatic cholelithiasis Referring Physician: Dr. Riccardo Dubin Arrien  Stephanie Andrade is an 61 y.o. female.  HPI: This is a 61 year old female who presents with a 2 day history of upper abdominal discomfort. It became even worse today. She was also having nausea and vomiting. The pain was in both upper quadrants. She is found to have elevated liver function tests and ultrasound showed gallstones with a possibly dilated bile duct. She was admitted and an MRCP was ordered. Since then, she has had significant improvement in her abdominal discomfort and reports now it is minimal. She has no prior history of similar abdominal discomfort. The pain was described as sharp and colicky in nature.  Past Medical History:  Diagnosis Date  . Arthritis   . Asthma   . Depression   . Family history of adverse reaction to anesthesia    sister, brother - PONV  . Family history of ovarian cancer   . Fibroids   . History of anemia   . History of bronchitis   . History of pneumonia   . History of sarcoidosis   . Hypertension   . PONV (postoperative nausea and vomiting) 03/26/2016  . Wears glasses     Past Surgical History:  Procedure Laterality Date  . COLONOSCOPY    . ESOPHAGOGASTRODUODENOSCOPY    . JOINT REPLACEMENT    . 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   Procedure: RIGHT TOTAL HIP ARTHROPLASTY ANTERIOR APPROACH;  Surgeon: Mcarthur Rossetti, MD;  Location: [facility];  Service: Orthopedics;  Laterality: Right;    Family History  Problem Relation Age of Onset  . Cancer Mother   . Arthritis Father   . Hypertension Father   . Arthritis Sister   . Hypertension Brother     Social History:  reports that she quit smoking about 7 years ago. Her smoking use included Cigarettes. She has never used smokeless tobacco. She reports that she drinks alcohol. She reports that she does not use drugs.  Allergies:  Allergies  Allergen Reactions  . Mushroom Extract Complex Anaphylaxis  and Hives  . Codeine Nausea Only  . Dextromethorphan Nausea And Vomiting  . Levaquin [Levofloxacin In D5w] Other (See Comments)    Myalgias  . Tape Itching    Please use paper tape  . Tessalon Perles Nausea And Vomiting    Medications: I have reviewed the patient's current medications.  Results for orders placed or performed during the hospital encounter of 09/20/16 (from the past 48 hour(s))  Lipase, blood     Status: None   Collection Time: 09/20/16  1:45 PM  Result Value Ref Range   Lipase 29 11 - 51 U/L  Comprehensive metabolic panel     Status: Abnormal   Collection Time: 09/20/16  1:45 PM  Result Value Ref Range   Sodium 139 135 - 145 mmol/L   Potassium 4.6 3.5 - 5.1 mmol/L   Chloride 99 (L) 101 - 111 mmol/L   CO2 25 22 - 32 mmol/L   Glucose, Bld 135 (H) 65 - 99 mg/dL   BUN 20 6 - 20 mg/dL   Creatinine, Ser 0.93 0.44 - 1.00 mg/dL   Calcium 10.1 8.9 - 10.3 mg/dL   Total Protein 7.9 6.5 - 8.1 g/dL   Albumin 4.5 3.5 - 5.0 g/dL   AST 547 (H) 15 - 41 U/L   ALT 644 (H) 14 - 54 U/L   Alkaline Phosphatase 147 (H) 38 - 126 U/L   Total Bilirubin 2.8 (H)  0.3 - 1.2 mg/dL   GFR calc non Af Amer >60 >60 mL/min   GFR calc Af Amer >60 >60 mL/min    Comment: (NOTE) The eGFR has been calculated using the CKD EPI equation. This calculation has not been validated in all clinical situations. eGFR's persistently <60 mL/min signify possible Chronic Kidney Disease.    Anion gap 15 5 - 15  CBC     Status: None   Collection Time: 09/20/16  1:45 PM  Result Value Ref Range   WBC 9.9 4.0 - 10.5 K/uL   RBC 4.87 3.87 - 5.11 MIL/uL   Hemoglobin 13.3 12.0 - 15.0 g/dL   HCT 39.9 36.0 - 46.0 %   MCV 81.9 78.0 - 100.0 fL   MCH 27.3 26.0 - 34.0 pg   MCHC 33.3 30.0 - 36.0 g/dL   RDW 13.4 11.5 - 15.5 %   Platelets 270 150 - 400 K/uL  Urinalysis, Routine w reflex microscopic     Status: Abnormal   Collection Time: 09/20/16  2:13 PM  Result Value Ref Range   Color, Urine AMBER (A) YELLOW     Comment: BIOCHEMICALS MAY BE AFFECTED BY COLOR   APPearance CLEAR CLEAR   Specific Gravity, Urine 1.023 1.005 - 1.030   pH 8.0 5.0 - 8.0   Glucose, UA NEGATIVE NEGATIVE mg/dL   Hgb urine dipstick NEGATIVE NEGATIVE   Bilirubin Urine NEGATIVE NEGATIVE   Ketones, ur 20 (A) NEGATIVE mg/dL   Protein, ur 30 (A) NEGATIVE mg/dL   Nitrite NEGATIVE NEGATIVE   Leukocytes, UA NEGATIVE NEGATIVE   RBC / HPF 0-5 0 - 5 RBC/hpf   WBC, UA 0-5 0 - 5 WBC/hpf   Bacteria, UA NONE SEEN NONE SEEN   Squamous Epithelial / LPF 0-5 (A) NONE SEEN   Mucous PRESENT     Mr Abdomen Wo Contrast  Result Date: 09/20/2016 CLINICAL DATA:  61 year old female inpatient with epigastric/right upper quadrant abdominal pain, elevated bilirubin, cholelithiasis and dilated common bile duct at sonography. EXAM: MRI ABDOMEN WITHOUT CONTRAST TECHNIQUE: Multiplanar multisequence MR imaging was performed without the administration of intravenous contrast. COMPARISON:  Abdominal sonogram from earlier today. FINDINGS: Motion degraded scan. Lower chest: Clear lung bases. Hepatobiliary: Normal liver size and configuration. No hepatic steatosis. No liver mass. Distended gallbladder contains several subcentimeter layering gallstones. No significant gallbladder wall thickening or pericholecystic fluid. Minimal central intrahepatic biliary ductal dilatation. Mildly dilated common bile duct. Common bile duct diameter 8 mm. No evidence of choledocholithiasis on the motion degraded sequences. No biliary strictures. No appreciable biliary or ampullary mass. Pancreas: No pancreatic mass or duct dilation. No evidence of pancreas divisum. Spleen: Normal size. No mass. Adrenals/Urinary Tract: Normal right adrenal. Left adrenal 2.9 cm nodule is stable since 04/01/2016 and demonstrates prominent loss of signal intensity on out of phase chemical shift imaging, diagnostic of a benign adenoma. No hydronephrosis. Normal size kidneys. Simple appearing subcentimeter  renal cyst in the posterior upper left kidney. No additional renal lesions. Stomach/Bowel: Grossly normal stomach. Visualized small and large bowel is normal caliber, with no bowel wall thickening. Vascular/Lymphatic: Normal caliber abdominal aorta. No pathologically enlarged lymph nodes in the abdomen. Other: No abdominal ascites or focal fluid collection. Musculoskeletal: No aggressive appearing focal osseous lesions. IMPRESSION: 1. Limited motion degraded scan. 2. Distended gallbladder with cholelithiasis. No specific MR findings of acute cholecystitis . 3. Minimal central intrahepatic biliary ductal dilatation. Mildly dilated common bile duct (8 mm diameter). No evidence of choledocholithiasis. 4. Stable left adrenal adenoma .  Electronically Signed   By: Ilona Sorrel M.D.   On: 09/20/2016 18:42   US Abdomen Complete  Result Date: 09/20/2016 CLINICAL DATA:  Abdominal pain since Friday. Elevated liver function studies. EXAM: ABDOMEN ULTRASOUND COMPLETE COMPARISON:  None. FINDINGS: Gallbladder: Numerous small layering gallstones in the gallbladder. The largest measures 6 mm. There is also some layering echogenic sludge. No gallbladder wall thickening, pericholecystic fluid or sonographic Murphy sign to suggest acute cholecystitis. Common bile duct: Diameter: 8 mm Liver: Normal echogenicity without focal lesion or biliary dilatation. IVC: Normal caliber Pancreas: Sonographically normal Spleen: Normal size and echogenicity without focal lesions. Right Kidney: Length: 11.1 cm. Normal renal cortical thickness and echogenicity without focal lesions or hydronephrosis. Left Kidney: Length: 11.7 cm. Normal renal cortical thickness and echogenicity without focal lesions or hydronephrosis. Abdominal aorta: Normal caliber. Other findings: Left adrenal gland adenoma is again demonstrated. IMPRESSION: 1. Cholelithiasis without sonographic findings for acute cholecystitis. 2. Dilated common bile duct could be due to a distal  common bile duct stone. ERCP or MRCP may be helpful for further evaluation, particularly given the patient's elevated liver function studies. 3. Stable left adrenal gland nodule. 4. The liver, spleen, pancreas and kidneys are unremarkable. Electronically Signed   By: Marijo Sanes M.D.   On: 09/20/2016 15:43   Mr 3d Recon At Scanner  Result Date: 09/20/2016 CLINICAL DATA:  61 year old female inpatient with epigastric/right upper quadrant abdominal pain, elevated bilirubin, cholelithiasis and dilated common bile duct at sonography. EXAM: MRI ABDOMEN WITHOUT CONTRAST TECHNIQUE: Multiplanar multisequence MR imaging was performed without the administration of intravenous contrast. COMPARISON:  Abdominal sonogram from earlier today. FINDINGS: Motion degraded scan. Lower chest: Clear lung bases. Hepatobiliary: Normal liver size and configuration. No hepatic steatosis. No liver mass. Distended gallbladder contains several subcentimeter layering gallstones. No significant gallbladder wall thickening or pericholecystic fluid. Minimal central intrahepatic biliary ductal dilatation. Mildly dilated common bile duct. Common bile duct diameter 8 mm. No evidence of choledocholithiasis on the motion degraded sequences. No biliary strictures. No appreciable biliary or ampullary mass. Pancreas: No pancreatic mass or duct dilation. No evidence of pancreas divisum. Spleen: Normal size. No mass. Adrenals/Urinary Tract: Normal right adrenal. Left adrenal 2.9 cm nodule is stable since 04/01/2016 and demonstrates prominent loss of signal intensity on out of phase chemical shift imaging, diagnostic of a benign adenoma. No hydronephrosis. Normal size kidneys. Simple appearing subcentimeter renal cyst in the posterior upper left kidney. No additional renal lesions. Stomach/Bowel: Grossly normal stomach. Visualized small and large bowel is normal caliber, with no bowel wall thickening. Vascular/Lymphatic: Normal caliber abdominal aorta. No  pathologically enlarged lymph nodes in the abdomen. Other: No abdominal ascites or focal fluid collection. Musculoskeletal: No aggressive appearing focal osseous lesions. IMPRESSION: 1. Limited motion degraded scan. 2. Distended gallbladder with cholelithiasis. No specific MR findings of acute cholecystitis . 3. Minimal central intrahepatic biliary ductal dilatation. Mildly dilated common bile duct (8 mm diameter). No evidence of choledocholithiasis. 4. Stable left adrenal adenoma . Electronically Signed   By: Ilona Sorrel M.D.   On: 09/20/2016 18:42    Review of Systems  Constitutional: Negative for chills and fever.  HENT: Negative for congestion.   Respiratory: Negative for cough, shortness of breath and stridor.   Cardiovascular: Negative for chest pain.  Gastrointestinal: Positive for abdominal pain, nausea and vomiting. Negative for constipation and diarrhea.  Genitourinary: Negative for dysuria.  All other systems reviewed and are negative.  Blood pressure (!) 120/57, pulse 79, temperature 98.9 F (37.2 C), temperature  source Oral, resp. rate 20, height _0  (1.727 m), weight 113.4 kg (250 lb), last menstrual period 04/06/2011, SpO2 99 %. Physical Exam  Assessment/Plan: Patient with symptomatic cholelithiasis and possible choledocholithiasis.  Symptomatically, she is improved. The MRCP did not show a common bile duct stone which I suspect she has now passed. I explained the diagnosis to her and her husband. Laparoscopic cholecystectomy with cholangiogram is recommended. This will be planned for tomorrow depending on the OR schedule. I explained the procedure to them and the reasoning for this. We will repeat her liver function tests in the morning. I will let her have clear liquids and then she will be nothing by mouth at midnight  [facility] A 09/20/2016, 9:41 PM

## 2016-09-20 NOTE — ED Triage Notes (Signed)
The patient presented to the [facility] with a complaint of abdominal pain and mid to upper back pain that started yesterday. The patient also reported an odor to her urine.

## 2016-09-20 NOTE — H&P (Signed)
History and Physical    Stephanie Andrade:[id number] DOB: [birth year] DOA: 09/20/2016  PCP: Margaree Mackintosh, MD   Patient coming from: Home   Chief Complaint: abdominal pain   HPI: Stephanie Andrade is a 61 y.o. female with medical history significant for hypertension, who presents to the hospital with a chief complaint of abdominal pain. The pain is in the epigastric region, radiated to the sides bilaterally, it occurred abruptly, 10 out of 10 in intensity, colicky nature, associated with nausea and diaphoresis, no improving or worsening factors, due to persistent symptoms she presented to hospital for further evaluation. Over last 48 hours she has been experiencing intermittent right upper quadrant abdominal pain, colicky in nature, not related to meals, usually subsiding after emesis. Today the pain was more persistent and severe.  ED Course: Patient was found in pain, analgesics were given, workup included LFTs which were abnormal including high bilirubins, ultrasonography showed cholelithiasis with dilated common bile duct. Referred for admission and further evaluation.  Review of Systems:  1. General. No fevers no chills, decreased appetite. 2. ENT no runny nose or sore throat 3. Cardiovascular. No angina no claudication, no PND orthopnea 4. Pulmonary. No shortness of breath cough or hemoptysis 5. Gastrointestinal positive for abdominal pain and nausea with vomiting, no diarrhea. 6. Musculoskeletal no joint pain 7. Dermatology no rashes 8. Urology no dysuria or increased urinary frequency 9. Neurology no seizures or paresthesias 10. Psych. No depression or anxiety  Past Medical History:  Diagnosis Date  . Arthritis   . Asthma   . Depression   . Family history of adverse reaction to anesthesia    sister, brother - PONV  . Family history of ovarian cancer   . Fibroids   . History of anemia   . History of bronchitis   . History of pneumonia   . History of sarcoidosis   .  Hypertension   . PONV (postoperative nausea and vomiting) 03/26/2016  . Wears glasses     Past Surgical History:  Procedure Laterality Date  . COLONOSCOPY    . ESOPHAGOGASTRODUODENOSCOPY    . JOINT REPLACEMENT    . 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   Procedure: RIGHT TOTAL HIP ARTHROPLASTY ANTERIOR APPROACH;  Surgeon: Kathryne Hitch, MD;  Location: [facility] OR;  Service: Orthopedics;  Laterality: Right;     reports that she quit smoking about 7 years ago. Her smoking use included Cigarettes. She has never used smokeless tobacco. She reports that she drinks alcohol. She reports that she does not use drugs.  Allergies  Allergen Reactions  . Mushroom Extract Complex Anaphylaxis and Hives  . Codeine Nausea Only  . Dextromethorphan Nausea And Vomiting  . Levaquin [Levofloxacin In D5w] Other (See Comments)    Myalgias  . Tape Itching    Please use paper tape  . Tessalon Perles Nausea And Vomiting    Family History  Problem Relation Age of Onset  . Cancer Mother   . Arthritis Father   . Hypertension Father   . Arthritis Sister   . Hypertension Brother    Unacceptable: Noncontributory, unremarkable, or negative. Acceptable: Family history reviewed and not pertinent (If you reviewed it)  Prior to Admission medications   Medication Sig Start Date End Date Taking? Authorizing Provider  acetaminophen (TYLENOL) 500 MG tablet Take 1,000 mg by mouth 2 (two) times daily.    Yes Historical Provider, MD  albuterol (PROVENTIL HFA;VENTOLIN HFA) 108 (90 Base) MCG/ACT inhaler Inhale 2 puffs into the lungs every  6 (six) hours as needed for wheezing or shortness of breath. 09/30/15  Yes Margaree Mackintosh, MD  Cholecalciferol (VITAMIN D3) 10000 units TABS Take 10,000 Units by mouth daily.   Yes Historical Provider, MD  CONTRAVE 8-90 MG TB12 TAKE 1 TABLET TWICE DAILY. 09/04/16  Yes Margaree Mackintosh, MD  losartan (COZAAR) 50 MG tablet Take 1 tablet (50 mg total) by mouth daily. 10/11/15  Yes Margaree Mackintosh, MD  meloxicam (MOBIC) 15 MG tablet TAKE 1 TABLET ONCE DAILY. 08/06/16  Yes Margaree Mackintosh, MD  methocarbamol (ROBAXIN) 500 MG tablet Take 1 tablet (500 mg total) by mouth every 6 (six) hours as needed for muscle spasms. 03/27/16  Yes Kathryne Hitch, MD  Omega-3 Fatty Acids (FISH OIL) 1000 MG CAPS Take 1,000 mg by mouth daily.   Yes Historical Provider, MD  Turmeric 500 MG TABS Take 500 mg by mouth daily.   Yes Historical Provider, MD  valACYclovir (VALTREX) 500 MG tablet Take 500 mg by mouth See admin instructions. Take 1 tablet (500 mg) by mouth twice daily for 3 days as needed for breakouts 07/01/16  Yes Historical Provider, MD  zolpidem (AMBIEN) 5 MG tablet Take 1 tablet (5 mg total) by mouth at bedtime as needed. for sleep Patient taking differently: Take 5 mg by mouth at bedtime as needed for sleep.  08/03/16  Yes Margaree Mackintosh, MD  aspirin EC 325 MG EC tablet Take 1 tablet (325 mg total) by mouth 2 (two) times daily after a meal. Patient not taking: Reported on 09/20/2016 03/27/16   Kathryne Hitch, MD  buPROPion (WELLBUTRIN XL) 150 MG 24 hr tablet TAKE 1 TABLET EACH DAY. Patient not taking: Reported on 09/20/2016 05/26/16   Margaree Mackintosh, MD  diazepam (VALIUM) 5 MG tablet For muscle spasm Patient not taking: Reported on 08/03/2016 03/16/13   Enid Baas, MD    Physical Exam: Vitals:   09/20/16 1324 09/20/16 1333 09/20/16 1430 09/20/16 1445  BP: 149/86  140/66 134/87  Pulse: 83  64 72  Resp: 26     Temp: 97.9 F (36.6 C)     TempSrc: Oral     SpO2: 100%  100% 99%  Weight:  113.4 kg (250 lb)    Height:  5\' 8"  (1.727 m)        Constitutional: deconditioned Vitals:   09/20/16 1324 09/20/16 1333 09/20/16 1430 09/20/16 1445  BP: 149/86  140/66 134/87  Pulse: 83  64 72  Resp: 26     Temp: 97.9 F (36.6 C)     TempSrc: Oral     SpO2: 100%  100% 99%  Weight:  113.4 kg (250 lb)    Height:  5\' 8"  (1.727 m)     Eyes: PERRL, lids and conjunctivae no pallor or  icterus, oral mucosa dry.  Head normocephalic, nose and ears no deformities.  ENMT: Mucous membranes are dry. Posterior pharynx clear of any exudate or lesions.Normal dentition.  Neck: normal, supple, no masses, no thyromegaly Respiratory: clear to auscultation bilaterally, no wheezing, no crackles. Normal respiratory effort. No accessory muscle use.  Cardiovascular: Regular rate and rhythm, no murmurs / rubs / gallops. No extremity edema. 2+ pedal pulses. No carotid bruits.  Abdomen: mild tenderness to deep palpation at the upper quadrant on the right, negative murphy signs, no masses palpated. No hepatosplenomegaly. Bowel sounds positive and increased. Abdomen protuberant and mildly distended.  Musculoskeletal: no clubbing / cyanosis. No joint deformity upper and lower extremities.  Good ROM, no contractures. Normal muscle tone.  Skin: no rashes, lesions, ulcers. No induration Neurologic: CN 2-12 grossly intact. Sensation intact, DTR normal. Strength 5/5 in all 4.   Labs on Admission: I have personally reviewed following labs and imaging studies  CBC:  Recent Labs Lab 09/20/16 1345  WBC 9.9  HGB 13.3  HCT 39.9  MCV 81.9  PLT 270   Basic Metabolic Panel:  Recent Labs Lab 09/20/16 1345  NA 139  K 4.6  CL 99*  CO2 25  GLUCOSE 135*  BUN 20  CREATININE 0.93  CALCIUM 10.1   GFR: Estimated Creatinine Clearance: 86.1 mL/min (by C-G formula based on SCr of 0.93 mg/dL). Liver Function Tests:  Recent Labs Lab 09/20/16 1345  AST 547*  ALT 644*  ALKPHOS 147*  BILITOT 2.8*  PROT 7.9  ALBUMIN 4.5    Recent Labs Lab 09/20/16 1345  LIPASE 29   No results for input(s): AMMONIA in the last 168 hours. Coagulation Profile: No results for input(s): INR, PROTIME in the last 168 hours. Cardiac Enzymes: No results for input(s): CKTOTAL, CKMB, CKMBINDEX, TROPONINI in the last 168 hours. BNP (last 3 results) No results for input(s): PROBNP in the last 8760 hours. HbA1C: No  results for input(s): HGBA1C in the last 72 hours. CBG: No results for input(s): GLUCAP in the last 168 hours. Lipid Profile: No results for input(s): CHOL, HDL, LDLCALC, TRIG, CHOLHDL, LDLDIRECT in the last 72 hours. Thyroid Function Tests: No results for input(s): TSH, T4TOTAL, FREET4, T3FREE, THYROIDAB in the last 72 hours. Anemia Panel: No results for input(s): VITAMINB12, FOLATE, FERRITIN, TIBC, IRON, RETICCTPCT in the last 72 hours. Urine analysis:    Component Value Date/Time   COLORURINE AMBER (A) 09/20/2016 1413   APPEARANCEUR CLEAR 09/20/2016 1413   LABSPEC 1.023 09/20/2016 1413   PHURINE 8.0 09/20/2016 1413   GLUCOSEU NEGATIVE 09/20/2016 1413   HGBUR NEGATIVE 09/20/2016 1413   BILIRUBINUR NEGATIVE 09/20/2016 1413   BILIRUBINUR neg 10/11/2015 1017   KETONESUR 20 (A) 09/20/2016 1413   PROTEINUR 30 (A) 09/20/2016 1413   UROBILINOGEN >=8.0 09/20/2016 1238   NITRITE NEGATIVE 09/20/2016 1413   LEUKOCYTESUR NEGATIVE 09/20/2016 1413   Sepsis Labs: !!!!!!!!!!!!!!!!!!!!!!!!!!!!!!!!!!!!!!!!!!!! @LABRCNTIP (procalcitonin:4,lacticidven:4) )No results found for this or any previous visit (from the past 240 hour(s)).   Radiological Exams on Admission: US Abdomen Complete  Result Date: 09/20/2016 CLINICAL DATA:  Abdominal pain since Friday. Elevated liver function studies. EXAM: ABDOMEN ULTRASOUND COMPLETE COMPARISON:  None. FINDINGS: Gallbladder: Numerous small layering gallstones in the gallbladder. The largest measures 6 mm. There is also some layering echogenic sludge. No gallbladder wall thickening, pericholecystic fluid or sonographic Murphy sign to suggest acute cholecystitis. Common bile duct: Diameter: 8 mm Liver: Normal echogenicity without focal lesion or biliary dilatation. IVC: Normal caliber Pancreas: Sonographically normal Spleen: Normal size and echogenicity without focal lesions. Right Kidney: Length: 11.1 cm. Normal renal cortical thickness and echogenicity without focal  lesions or hydronephrosis. Left Kidney: Length: 11.7 cm. Normal renal cortical thickness and echogenicity without focal lesions or hydronephrosis. Abdominal aorta: Normal caliber. Other findings: Left adrenal gland adenoma is again demonstrated. IMPRESSION: 1. Cholelithiasis without sonographic findings for acute cholecystitis. 2. Dilated common bile duct could be due to a distal common bile duct stone. ERCP or MRCP may be helpful for further evaluation, particularly given the patient's elevated liver function studies. 3. Stable left adrenal gland nodule. 4. The liver, spleen, pancreas and kidneys are unremarkable. Electronically Signed   By: Orlene Plum.D.  On: 09/20/2016 15:43    EKG: Independently reviewed. NA  Assessment/Plan Active Problems:   Biliary obstruction   Cholelithiases  This is a 61 year old female with history of hypertension who presents to the hospital with biliary colic ongoing for last 48 hours with acute exacerbation today. On the physical examination she looks dehydrated, blood pressure 134/87, heart rate 72, respiratory 20, oxygen saturation 100% on room air. Conjunctiva without pallor or icterus, oral mucosa is dry, the abdomen is mildly distended, Murphy sign negative, no peritoneal signs. Sodium 139, potassium 4.6, chloride 99, bicarbonate 25, glucose 135, BUN 20, creatinine 0.93, alkaline phosphatase 147, lipase 29, AST 547, ALT 644, total bilirubin is 2.8. White count 9.9, hemoglobin 13.3, hematocrit 39.9, platelets 270. Urine analysis negative for infection. Abdominal ultrasonography showing cholelithiasis without sonographic findings of acute cholecystitis, dilated colon bile duct, 8 mm.   Working diagnosis: Biliary colic due to cholelithiasis complicated by common bile duct obstruction with hyperbilirubinemia and elevated transaminases.   1. Cholelithiasis with common bile duct obstruction. No signs of cholecystitis or cholangitis, will proceed with further workup  MRCP, gastroenterology has been contacted, most likely patient will need ERCP. Will continue hydration with IV fluids with dextrose, pain control with morphine, antiemetics and antiacids.  2. Hypertension. Will hold on agents antihypertensive agents to avoid hypotension. Patient will be hydrated with balance electrolyte solutions intravenously.   3. Asthma. No signs of exacerbation continue albuterol as needed.    DVT prophylaxis:  Enoxaparin  Code Status:  Full  Family Communication:  I spoke with patient's family at the bedside and all questions were addressed  Disposition Plan: Home Consults called: GI Admission status: Inpatient    Jeanice Dempsey Annett Gula MD [facility] Pager [phone]  If 7PM-7AM, please contact night-coverage [email or web address] Password [email or web address]  09/20/2016, 5:09 PM

## 2016-09-20 NOTE — Progress Notes (Signed)
Pt admitted to room 6N25 from ED s/p MRCP.  Pt is alert and oriented and pleasant.  Not currently in any pain.  Oriented to room.  NPO at present.  Ordered SCDs for pt.

## 2016-09-20 NOTE — ED Provider Notes (Addendum)
[facility] DEPT Provider Note   CSN: [id number] Arrival date & time: 09/20/16  1318     History   Chief Complaint Chief Complaint  Patient presents with  . Abdominal Pain  . Nausea  . Emesis    HPI FATIMAZAHRA IRIAS is a 61 y.o. female.  Patient c/o upper abdominal pain, especially right, for the past 2 days. Initially was episodic, but persistent/constant today. Pain dull, severe, radiates around to back. No prior hx gallstones. +fam hx gallstones. No hx pud or pancreatitis. Nausea, had vomiting earlier. No prior abd surgery. No fever or chills. No chest pain or sob.      The history is provided by the patient.  Abdominal Pain   Associated symptoms include vomiting. Pertinent negatives include fever and headaches.  Emesis   Associated symptoms include abdominal pain. Pertinent negatives include no chills, no fever and no headaches.    Past Medical History:  Diagnosis Date  . Arthritis   . Asthma   . Depression   . Family history of adverse reaction to anesthesia    sister, brother - PONV  . Family history of ovarian cancer   . Fibroids   . History of anemia   . History of bronchitis   . History of pneumonia   . History of sarcoidosis   . Hypertension   . PONV (postoperative nausea and vomiting) 03/26/2016  . Wears glasses     Patient Active Problem List   Diagnosis Date Noted  . Status post total replacement of right hip 03/24/2016  . Osteoarthritis of left hip 11/26/2015  . Impaired glucose tolerance 12/09/2014  . Plantar fascia rupture 06/16/2012  . History of vitamin D deficiency 11/15/2011  . History of sarcoidosis 11/15/2011  . Hot flashes not due to menopause 07/19/2011  . Spinal stenosis, lumbar region, with neurogenic claudication 03/10/2011  . Hypertension 02/11/2011  . Asthma 02/11/2011  . Morbid obesity ([place]) 02/11/2011  . Depression 02/11/2011  . Cervical radiculopathy 12/04/2010  . SHOULDER PAIN, BILATERAL 10/01/2010  . WRIST PAIN,  BILATERAL 10/01/2010  . LUMBAGO 10/01/2010  . Osteoarthritis of right hip 07/17/2009  . GREATER TROCHANTERIC BURSITIS 07/17/2009    Past Surgical History:  Procedure Laterality Date  . COLONOSCOPY    . ESOPHAGOGASTRODUODENOSCOPY    . JOINT REPLACEMENT    . 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   Procedure: RIGHT TOTAL HIP ARTHROPLASTY ANTERIOR APPROACH;  Surgeon: Mcarthur Rossetti, MD;  Location: [facility];  Service: Orthopedics;  Laterality: Right;    OB History    No data available       Home Medications    Prior to Admission medications   Medication Sig Start Date End Date Taking? Authorizing Provider  acetaminophen (TYLENOL) 500 MG tablet Take 1,000 mg by mouth every 8 (eight) hours as needed for mild pain or moderate pain.     Historical Provider, MD  albuterol (PROVENTIL HFA;VENTOLIN HFA) 108 (90 Base) MCG/ACT inhaler Inhale 2 puffs into the lungs every 6 (six) hours as needed for wheezing or shortness of breath. 09/30/15   Elby Showers, MD  aspirin EC 325 MG EC tablet Take 1 tablet (325 mg total) by mouth 2 (two) times daily after a meal. 03/27/16   Mcarthur Rossetti, MD  buPROPion (WELLBUTRIN XL) 150 MG 24 hr tablet TAKE 1 TABLET EACH DAY. 05/26/16   Elby Showers, MD  cholecalciferol (VITAMIN D) 1000 units tablet Take 1,000 Units by mouth daily.    Historical Provider, MD  CONTRAVE 8-90 MG TB12 TAKE 1 TABLET TWICE DAILY. 09/04/16   Elby Showers, MD  diazepam (VALIUM) 5 MG tablet For muscle spasm Patient not taking: Reported on 08/03/2016 03/16/13   Stefanie Libel, MD  losartan (COZAAR) 50 MG tablet Take 1 tablet (50 mg total) by mouth daily. 10/11/15   Elby Showers, MD  meloxicam (MOBIC) 15 MG tablet TAKE 1 TABLET ONCE DAILY. 08/06/16   Elby Showers, MD  methocarbamol (ROBAXIN) 500 MG tablet Take 1 tablet (500 mg total) by mouth every 6 (six) hours as needed for muscle spasms. 03/27/16   Mcarthur Rossetti, MD  Omega-3 Fatty Acids (FISH OIL) 1000 MG CAPS Take 1,000  mg by mouth daily.    Historical Provider, MD  Turmeric 500 MG TABS Take 500 mg by mouth daily.    Historical Provider, MD  valACYclovir (VALTREX) 500 MG tablet Take 500 mg by mouth as needed. 07/01/16   Historical Provider, MD  zolpidem (AMBIEN) 5 MG tablet Take 1 tablet (5 mg total) by mouth at bedtime as needed. for sleep 08/03/16   Elby Showers, MD    Family History Family History  Problem Relation Age of Onset  . Cancer Mother   . Arthritis Father   . Hypertension Father   . Arthritis Sister   . Hypertension Brother     Social History Social History  Substance Use Topics  . Smoking status: Former Smoker    Types: Cigarettes    Quit date: 06/20/2009  . Smokeless tobacco: Never Used     Comment: "smoked 2 weeks out of the year for about 15 years"   . Alcohol use Yes     Comment: socially     Allergies   Mushroom extract complex; Codeine; Dextromethorphan; Levaquin [levofloxacin in d5w]; Tape; and Tessalon perles   Review of Systems Review of Systems  Constitutional: Negative for chills and fever.  HENT: Negative for sore throat.   Eyes: Negative for redness.  Respiratory: Negative for shortness of breath.   Cardiovascular: Negative for chest pain.  Gastrointestinal: Positive for abdominal pain and vomiting.  Genitourinary: Negative for flank pain.  Musculoskeletal: Negative for neck pain.  Skin: Negative for rash.  Neurological: Negative for headaches.  Hematological: Does not bruise/bleed easily.  Psychiatric/Behavioral: Negative for confusion.     Physical Exam Updated Vital Signs BP 149/86 (BP Location: Left Arm)   Pulse 83   Temp 97.9 F (36.6 C) (Oral)   Resp 26   Ht 5\' 8"  (1.727 m)   Wt 113.4 kg   LMP 04/06/2011   SpO2 100%   BMI 38.01 kg/m   Physical Exam  Constitutional: She appears well-developed and well-nourished. She appears distressed.  Very uncomfortable, moaning in pain.   HENT:  Mouth/Throat: Oropharynx is clear and moist.  Eyes:  Conjunctivae are normal. No scleral icterus.  Neck: Neck supple. No tracheal deviation present.  Cardiovascular: Normal rate, regular rhythm, normal heart sounds and intact distal pulses.   Pulmonary/Chest: Effort normal and breath sounds normal. No respiratory distress.  Abdominal: Soft. Normal appearance and bowel sounds are normal. She exhibits no distension and no mass. There is tenderness. There is no rebound and no guarding. No hernia.  ruq tenderness  Genitourinary:  Genitourinary Comments: No cva tenderness  Musculoskeletal: She exhibits no edema.  Neurological: She is alert.  Skin: Skin is warm and dry. No rash noted.  Psychiatric:  Anxious appearing.   Nursing note and vitals reviewed.    ED Treatments /  Results  Labs (all labs ordered are listed, but only abnormal results are displayed) Results for orders placed or performed during the hospital encounter of 09/20/16  Lipase, blood  Result Value Ref Range   Lipase 29 11 - 51 U/L  Comprehensive metabolic panel  Result Value Ref Range   Sodium 139 135 - 145 mmol/L   Potassium 4.6 3.5 - 5.1 mmol/L   Chloride 99 (L) 101 - 111 mmol/L   CO2 25 22 - 32 mmol/L   Glucose, Bld 135 (H) 65 - 99 mg/dL   BUN 20 6 - 20 mg/dL   Creatinine, Ser 0.93 0.44 - 1.00 mg/dL   Calcium 10.1 8.9 - 10.3 mg/dL   Total Protein 7.9 6.5 - 8.1 g/dL   Albumin 4.5 3.5 - 5.0 g/dL   AST 547 (H) 15 - 41 U/L   ALT 644 (H) 14 - 54 U/L   Alkaline Phosphatase 147 (H) 38 - 126 U/L   Total Bilirubin 2.8 (H) 0.3 - 1.2 mg/dL   GFR calc non Af Amer >60 >60 mL/min   GFR calc Af Amer >60 >60 mL/min   Anion gap 15 5 - 15  CBC  Result Value Ref Range   WBC 9.9 4.0 - 10.5 K/uL   RBC 4.87 3.87 - 5.11 MIL/uL   Hemoglobin 13.3 12.0 - 15.0 g/dL   HCT 39.9 36.0 - 46.0 %   MCV 81.9 78.0 - 100.0 fL   MCH 27.3 26.0 - 34.0 pg   MCHC 33.3 30.0 - 36.0 g/dL   RDW 13.4 11.5 - 15.5 %   Platelets 270 150 - 400 K/uL  Urinalysis, Routine w reflex microscopic  Result  Value Ref Range   Color, Urine AMBER (A) YELLOW   APPearance CLEAR CLEAR   Specific Gravity, Urine 1.023 1.005 - 1.030   pH 8.0 5.0 - 8.0   Glucose, UA NEGATIVE NEGATIVE mg/dL   Hgb urine dipstick NEGATIVE NEGATIVE   Bilirubin Urine NEGATIVE NEGATIVE   Ketones, ur 20 (A) NEGATIVE mg/dL   Protein, ur 30 (A) NEGATIVE mg/dL   Nitrite NEGATIVE NEGATIVE   Leukocytes, UA NEGATIVE NEGATIVE   RBC / HPF 0-5 0 - 5 RBC/hpf   WBC, UA 0-5 0 - 5 WBC/hpf   Bacteria, UA NONE SEEN NONE SEEN   Squamous Epithelial / LPF 0-5 (A) NONE SEEN   Mucous PRESENT    US Abdomen Complete  Result Date: 09/20/2016 CLINICAL DATA:  Abdominal pain since Friday. Elevated liver function studies. EXAM: ABDOMEN ULTRASOUND COMPLETE COMPARISON:  None. FINDINGS: Gallbladder: Numerous small layering gallstones in the gallbladder. The largest measures 6 mm. There is also some layering echogenic sludge. No gallbladder wall thickening, pericholecystic fluid or sonographic Murphy sign to suggest acute cholecystitis. Common bile duct: Diameter: 8 mm Liver: Normal echogenicity without focal lesion or biliary dilatation. IVC: Normal caliber Pancreas: Sonographically normal Spleen: Normal size and echogenicity without focal lesions. Right Kidney: Length: 11.1 cm. Normal renal cortical thickness and echogenicity without focal lesions or hydronephrosis. Left Kidney: Length: 11.7 cm. Normal renal cortical thickness and echogenicity without focal lesions or hydronephrosis. Abdominal aorta: Normal caliber. Other findings: Left adrenal gland adenoma is again demonstrated. IMPRESSION: 1. Cholelithiasis without sonographic findings for acute cholecystitis. 2. Dilated common bile duct could be due to a distal common bile duct stone. ERCP or MRCP may be helpful for further evaluation, particularly given the patient's elevated liver function studies. 3. Stable left adrenal gland nodule. 4. The liver, spleen, pancreas and kidneys are  unremarkable.  Electronically Signed   By: Marijo Sanes M.D.   On: 09/20/2016 15:43    EKG  EKG Interpretation None       Radiology No results found.  Procedures Procedures (including critical care time)  Medications Ordered in ED Medications  ondansetron (ZOFRAN-ODT) 4 MG disintegrating tablet (not administered)  oxyCODONE-acetaminophen (PERCOCET/ROXICET) 5-325 MG per tablet (not administered)  sodium chloride 0.9 % bolus 1,000 mL (not administered)  HYDROmorphone (DILAUDID) injection 1 mg (not administered)  ondansetron (ZOFRAN) injection 4 mg (not administered)  ondansetron (ZOFRAN-ODT) disintegrating tablet 4 mg (4 mg Oral Given 09/20/16 1338)  oxyCODONE-acetaminophen (PERCOCET/ROXICET) 5-325 MG per tablet 1 tablet (1 tablet Oral Given 09/20/16 1339)     Initial Impression / Assessment and Plan / ED Course  I have reviewed the triage vital signs and the nursing notes.  Pertinent labs & imaging results that were available during my care of the patient were reviewed by me and considered in my medical decision making (see chart for details).  Iv ns bolus. Dilaudid 1 mg iv. zofran iv.  Labs. Ultrasound.  Reviewed nursing notes and prior charts for additional history.   LFTs high, however lipase normal - suspect cbd stone.  Patient states her gi doctor is [facility] - therefore will contact Howie Ill MD on call, and plan for admission to medical service.  Pain and nausea improved.   Discussed with Dr Michail Sermon - he indicates to order mrcp, admit to medicine, and they will plan to see in AM.   Medicine service consulted - Dr Cathlean Sauer requests temp orders, med surg.  Also discussed with Dr Ninfa Linden, gen surgery, who agrees with mrcp, medicine admit, gi consult, etc.       Final Clinical Impressions(s) / ED Diagnoses   Final diagnoses:  None    New Prescriptions New Prescriptions   No medications on file            Lajean Saver, MD 09/20/16 1658

## 2016-09-20 NOTE — ED Triage Notes (Signed)
Pt. Stated, Stephanie Andrade been in terrible pain since Friday. Pt is in the chair forward bent over with pain. Pt stated, I've had some vomiting. Pain is so bad I can't stand it.

## 2016-09-20 NOTE — ED Notes (Signed)
Pt transported to [facility] at this time. 

## 2016-09-20 NOTE — ED Provider Notes (Signed)
CSN: [id number]     Arrival date & time 09/20/16  1204 History   First MD Initiated Contact with Patient 09/20/16 1259     Chief Complaint  Patient presents with  . Abdominal Pain  . Back Pain   (Consider location/radiation/quality/duration/timing/severity/associated sxs/prior Treatment) 61 year old female presents to clinic with 24 hour complaint of abdominal pain. Pain is upper left quadrant, not associated with food intake, she has vomited 4-5 times in last 24 hours, she has tried no OTC treatments. She has not had her gall bladder removed and she has not had her appendix removed.    The history is provided by the patient.  Abdominal Pain  Pain location:  RUQ Pain quality: aching, gnawing, sharp and throbbing   Pain radiates to:  Back Pain severity:  Severe Onset quality:  Sudden Duration:  1 day Timing:  Constant Progression:  Worsening Chronicity:  New Context: not awakening from sleep, not diet changes, not eating, not recent illness and not trauma   Relieved by:  None tried Worsened by:  Nothing Associated symptoms: nausea and vomiting   Back Pain  Associated symptoms: abdominal pain     Past Medical History:  Diagnosis Date  . Arthritis   . Asthma   . Depression   . Family history of adverse reaction to anesthesia    sister, brother - PONV  . Family history of ovarian cancer   . Fibroids   . History of anemia   . History of bronchitis   . History of pneumonia   . History of sarcoidosis   . Hypertension   . PONV (postoperative nausea and vomiting) 03/26/2016  . Wears glasses    Past Surgical History:  Procedure Laterality Date  . COLONOSCOPY    . ESOPHAGOGASTRODUODENOSCOPY    . JOINT REPLACEMENT    . 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   Procedure: RIGHT TOTAL HIP ARTHROPLASTY ANTERIOR APPROACH;  Surgeon: Mcarthur Rossetti, MD;  Location: [facility];  Service: Orthopedics;  Laterality: Right;   Family History  Problem Relation Age of Onset  . Cancer  Mother   . Arthritis Father   . Hypertension Father   . Arthritis Sister   . Hypertension Brother    Social History  Substance Use Topics  . Smoking status: Former Smoker    Types: Cigarettes    Quit date: 06/20/2009  . Smokeless tobacco: Never Used     Comment: "smoked 2 weeks out of the year for about 15 years"   . Alcohol use Yes     Comment: socially   OB History    No data available     Review of Systems  Reason unable to perform ROS: as covered in HPI.  Gastrointestinal: Positive for abdominal pain, nausea and vomiting.  Musculoskeletal: Positive for back pain.  All other systems reviewed and are negative.   Allergies  Mushroom extract complex; Codeine; Dextromethorphan; Levaquin [levofloxacin in d5w]; Tape; and Tessalon perles  Home Medications   Prior to Admission medications   Medication Sig Start Date End Date Taking? Authorizing Provider  acetaminophen (TYLENOL) 500 MG tablet Take 1,000 mg by mouth every 8 (eight) hours as needed for mild pain or moderate pain.     Historical Provider, MD  albuterol (PROVENTIL HFA;VENTOLIN HFA) 108 (90 Base) MCG/ACT inhaler Inhale 2 puffs into the lungs every 6 (six) hours as needed for wheezing or shortness of breath. 09/30/15   Elby Showers, MD  aspirin EC 325 MG EC tablet Take  1 tablet (325 mg total) by mouth 2 (two) times daily after a meal. 03/27/16   Mcarthur Rossetti, MD  buPROPion (WELLBUTRIN XL) 150 MG 24 hr tablet TAKE 1 TABLET EACH DAY. 05/26/16   Elby Showers, MD  cholecalciferol (VITAMIN D) 1000 units tablet Take 1,000 Units by mouth daily.    Historical Provider, MD  CONTRAVE 8-90 MG TB12 TAKE 1 TABLET TWICE DAILY. 09/04/16   Elby Showers, MD  diazepam (VALIUM) 5 MG tablet For muscle spasm Patient not taking: Reported on 08/03/2016 03/16/13   Stefanie Libel, MD  losartan (COZAAR) 50 MG tablet Take 1 tablet (50 mg total) by mouth daily. 10/11/15   Elby Showers, MD  meloxicam (MOBIC) 15 MG tablet TAKE 1 TABLET ONCE  DAILY. 08/06/16   Elby Showers, MD  methocarbamol (ROBAXIN) 500 MG tablet Take 1 tablet (500 mg total) by mouth every 6 (six) hours as needed for muscle spasms. 03/27/16   Mcarthur Rossetti, MD  Omega-3 Fatty Acids (FISH OIL) 1000 MG CAPS Take 1,000 mg by mouth daily.    Historical Provider, MD  Turmeric 500 MG TABS Take 500 mg by mouth daily.    Historical Provider, MD  valACYclovir (VALTREX) 500 MG tablet Take 500 mg by mouth as needed. 07/01/16   Historical Provider, MD  zolpidem (AMBIEN) 5 MG tablet Take 1 tablet (5 mg total) by mouth at bedtime as needed. for sleep 08/03/16   Elby Showers, MD   Meds Ordered and Administered this Visit  Medications - No data to display  BP 133/77 (BP Location: Right Arm)   Pulse 74   Temp 97.9 F (36.6 C) (Oral)   Resp 20   LMP 04/06/2011   SpO2 100%  No data found.   Physical Exam  Constitutional: She is oriented to person, place, and time. She appears well-developed and well-nourished. She appears distressed.  HENT:  Head: Normocephalic and atraumatic.  Cardiovascular: Normal rate.   Pulmonary/Chest: Effort normal and breath sounds normal.  Abdominal: Soft. Normal appearance and bowel sounds are normal. There is no hepatosplenomegaly. There is tenderness in the right upper quadrant. There is positive Murphy's sign. There is no rigidity, no rebound, no CVA tenderness and no tenderness at McBurney's point.  Neurological: She is alert and oriented to person, place, and time.  Skin: Skin is warm and dry. Capillary refill takes less than 2 seconds. She is not diaphoretic.  Psychiatric: She has a normal mood and affect.  Nursing note and vitals reviewed.   Urgent Care Course     Procedures (including critical care time)  Labs Review Labs Reviewed  POCT URINALYSIS DIP (DEVICE) - Abnormal; Notable for the following:       Result Value   Glucose, UA 100 (*)    Bilirubin Urine MODERATE (*)    Ketones, ur 15 (*)    Hgb urine dipstick  TRACE (*)    Protein, ur 30 (*)    All other components within normal limits    Imaging Review No results found.   Visual Acuity Review  Right Eye Distance:   Left Eye Distance:   Bilateral Distance:    Right Eye Near:   Left Eye Near:    Bilateral Near:         MDM   1. Left upper quadrant pain    I concerned you may have a problem with your gall bladder based on your physical exam. I do not have access to ultrasound  or to CT here in clinic, nor do I have the ability to draw labs in a reasonable amount of time. Because of this, I am discharging you with strong advice to go to the ER for further work up and evaluation.     Barnet Glasgow, NP 09/20/16 1315

## 2016-09-21 DIAGNOSIS — J45909 Unspecified asthma, uncomplicated: Secondary | ICD-10-CM

## 2016-09-21 LAB — COMPREHENSIVE METABOLIC PANEL
ALT: 571 U/L — ABNORMAL HIGH (ref 14–54)
AST: 341 U/L — ABNORMAL HIGH (ref 15–41)
Albumin: 3.4 g/dL — ABNORMAL LOW (ref 3.5–5.0)
Alkaline Phosphatase: 135 U/L — ABNORMAL HIGH (ref 38–126)
Anion gap: 4 — ABNORMAL LOW (ref 5–15)
BUN: 17 mg/dL (ref 6–20)
CHLORIDE: 106 mmol/L (ref 101–111)
CO2: 31 mmol/L (ref 22–32)
Calcium: 9.1 mg/dL (ref 8.9–10.3)
Creatinine, Ser: 0.81 mg/dL (ref 0.44–1.00)
Glucose, Bld: 102 mg/dL — ABNORMAL HIGH (ref 65–99)
POTASSIUM: 3.7 mmol/L (ref 3.5–5.1)
SODIUM: 141 mmol/L (ref 135–145)
Total Bilirubin: 3.8 mg/dL — ABNORMAL HIGH (ref 0.3–1.2)
Total Protein: 6 g/dL — ABNORMAL LOW (ref 6.5–8.1)

## 2016-09-21 LAB — CBC
HEMATOCRIT: 34.3 % — AB (ref 36.0–46.0)
Hemoglobin: 11 g/dL — ABNORMAL LOW (ref 12.0–15.0)
MCH: 26.8 pg (ref 26.0–34.0)
MCHC: 32.1 g/dL (ref 30.0–36.0)
MCV: 83.7 fL (ref 78.0–100.0)
Platelets: 178 10*3/uL (ref 150–400)
RBC: 4.1 MIL/uL (ref 3.87–5.11)
RDW: 13.6 % (ref 11.5–15.5)
WBC: 5.4 10*3/uL (ref 4.0–10.5)

## 2016-09-21 LAB — MRSA PCR SCREENING: MRSA by PCR: NEGATIVE

## 2016-09-21 MED ORDER — CHLORHEXIDINE GLUCONATE CLOTH 2 % EX PADS
6.0000 | MEDICATED_PAD | Freq: Once | CUTANEOUS | Status: AC
  Administered 2016-09-21: 6 via TOPICAL

## 2016-09-21 MED ORDER — DEXTROSE 5 % IV SOLN
2.0000 g | INTRAVENOUS | Status: AC
  Administered 2016-09-22: 2 g via INTRAVENOUS
  Filled 2016-09-21: qty 2

## 2016-09-21 MED ORDER — CHLORHEXIDINE GLUCONATE CLOTH 2 % EX PADS: 6.0000 | MEDICATED_PAD | Freq: Once | CUTANEOUS | Status: DC

## 2016-09-21 MED ORDER — BUPROPION HCL 75 MG PO TABS
75.0000 mg | ORAL_TABLET | Freq: Two times a day (BID) | ORAL | Status: DC
  Administered 2016-09-21 – 2016-09-23 (×3): 75 mg via ORAL
  Filled 2016-09-21 (×3): qty 1

## 2016-09-21 NOTE — Consult Note (Signed)
MRCP, surgery consult and recommendation for cholecystectomy noted. Consult deferred. Will be on standby for ERCP in the event of an abnormal intraoperative cholangiogram.

## 2016-09-21 NOTE — Progress Notes (Signed)
PROGRESS NOTE    Stephanie Andrade  AYT:[id number] DOB: [birth year] DOA: 09/20/2016 PCP: Margaree Mackintosh, MD    Brief Narrative:  This is a 61 year old female with history of hypertension who presents to the hospital with biliary colic ongoing for last 48 hours with acute exacerbation today. On the physical examination she looks dehydrated, blood pressure 134/87, heart rate 72, respiratory 20, oxygen saturation 100% on room air. Conjunctiva without pallor or icterus, oral mucosa is dry, the abdomen is mildly distended, Murphy sign negative, no peritoneal signs. Sodium 139, potassium 4.6, chloride 99, bicarbonate 25, glucose 135, BUN 20, creatinine 0.93, alkaline phosphatase 147, lipase 29, AST 547, ALT 644, total bilirubin is 2.8. White count 9.9, hemoglobin 13.3, hematocrit 39.9, platelets 270. Urine analysis negative for infection. Abdominal ultrasonography showing cholelithiasis without sonographic findings of acute cholecystitis, dilated colon bile duct, 8 mm.   Working diagnosis: Biliary colic due to cholelithiasis complicated by common bile duct obstruction with hyperbilirubinemia and elevated transaminases.   Assessment & Plan:   Active Problems:   Biliary obstruction   Cholelithiases   1. Cholelithiasis. Suspected passing stone. Pain is improved, plan for cholecystectomy today with cholangiogram. Continue pain control and maintenance IV fluids.   2. HTN. Blood pressure well controlled, will continue close monitoring. Will continue to hold losartan.   3. Asthma. No signs of exacerbation, will continue as needed albuerol.  4. Obesity. Will hold on naltrexzone but will continue bupropion.     DVT prophylaxis: enoxaparin Code Status: full  Family Communication: Patient's family at the bedside and all questions were addressed.  Disposition Plan: home   Consultants:   Surgery  Procedures:  Antimicrobials:    Subjective: Patient ambulating, pain is controlled. Has been NPO,  no nausea or vomiting.   Objective: Vitals:   09/20/16 1630 09/20/16 1853 09/20/16 2226 09/21/16 0545  BP: 111/76 (!) 120/57 (!) 116/59 127/65  Pulse: 78 79 71 68  Resp: 18 20 19 18   Temp:  98.9 F (37.2 C) 98.3 F (36.8 C) 98.7 F (37.1 C)  TempSrc:  Oral Oral Oral  SpO2: 96% 99% 100% 100%  Weight:      Height:        Intake/Output Summary (Last 24 hours) at 09/21/16 3557 Last data filed at 09/21/16 0902  Gross per 24 hour  Intake          1186.67 ml  Output              550 ml  Net           636.67 ml   Filed Weights   09/20/16 1333  Weight: 113.4 kg (250 lb)    Examination:  General exam: not in pain E ENT: no pallor or icterus Respiratory system: Clear to auscultation. Respiratory effort normal. Cardiovascular system: S1 & S2 heard, RRR. No JVD, murmurs, rubs, gallops or clicks. No pedal edema. Gastrointestinal system: Abdomen is nondistended, soft and nontender. No organomegaly or masses felt. Normal bowel sounds heard. Central nervous system: Alert and oriented. No focal neurological deficits. Extremities: Symmetric 5 x 5 power. Skin: No rashes, lesions or ulcers  Data Reviewed: I have personally reviewed following labs and imaging studies  CBC:  Recent Labs Lab 09/20/16 1345 09/21/16 0617  WBC 9.9 5.4  HGB 13.3 11.0*  HCT 39.9 34.3*  MCV 81.9 83.7  PLT 270 178   Basic Metabolic Panel:  Recent Labs Lab 09/20/16 1345 09/21/16 0617  NA 139 141  K 4.6 3.7  CL  99* 106  CO2 25 31  GLUCOSE 135* 102*  BUN 20 17  CREATININE 0.93 0.81  CALCIUM 10.1 9.1   GFR: Estimated Creatinine Clearance: 98.8 mL/min (by C-G formula based on SCr of 0.81 mg/dL). Liver Function Tests:  Recent Labs Lab 09/20/16 1345 09/21/16 0617  AST 547* 341*  ALT 644* 571*  ALKPHOS 147* 135*  BILITOT 2.8* 3.8*  PROT 7.9 6.0*  ALBUMIN 4.5 3.4*    Recent Labs Lab 09/20/16 1345  LIPASE 29   No results for input(s): AMMONIA in the last 168 hours. Coagulation  Profile: No results for input(s): INR, PROTIME in the last 168 hours. Cardiac Enzymes: No results for input(s): CKTOTAL, CKMB, CKMBINDEX, TROPONINI in the last 168 hours. BNP (last 3 results) No results for input(s): PROBNP in the last 8760 hours. HbA1C: No results for input(s): HGBA1C in the last 72 hours. CBG: No results for input(s): GLUCAP in the last 168 hours. Lipid Profile: No results for input(s): CHOL, HDL, LDLCALC, TRIG, CHOLHDL, LDLDIRECT in the last 72 hours. Thyroid Function Tests: No results for input(s): TSH, T4TOTAL, FREET4, T3FREE, THYROIDAB in the last 72 hours. Anemia Panel: No results for input(s): VITAMINB12, FOLATE, FERRITIN, TIBC, IRON, RETICCTPCT in the last 72 hours. Sepsis Labs: No results for input(s): PROCALCITON, LATICACIDVEN in the last 168 hours.  No results found for this or any previous visit (from the past 240 hour(s)).       Radiology Studies: Mr Abdomen Wo Contrast  Result Date: 09/20/2016 CLINICAL DATA:  61 year old female inpatient with epigastric/right upper quadrant abdominal pain, elevated bilirubin, cholelithiasis and dilated common bile duct at sonography. EXAM: MRI ABDOMEN WITHOUT CONTRAST TECHNIQUE: Multiplanar multisequence MR imaging was performed without the administration of intravenous contrast. COMPARISON:  Abdominal sonogram from earlier today. FINDINGS: Motion degraded scan. Lower chest: Clear lung bases. Hepatobiliary: Normal liver size and configuration. No hepatic steatosis. No liver mass. Distended gallbladder contains several subcentimeter layering gallstones. No significant gallbladder wall thickening or pericholecystic fluid. Minimal central intrahepatic biliary ductal dilatation. Mildly dilated common bile duct. Common bile duct diameter 8 mm. No evidence of choledocholithiasis on the motion degraded sequences. No biliary strictures. No appreciable biliary or ampullary mass. Pancreas: No pancreatic mass or duct dilation. No  evidence of pancreas divisum. Spleen: Normal size. No mass. Adrenals/Urinary Tract: Normal right adrenal. Left adrenal 2.9 cm nodule is stable since 04/01/2016 and demonstrates prominent loss of signal intensity on out of phase chemical shift imaging, diagnostic of a benign adenoma. No hydronephrosis. Normal size kidneys. Simple appearing subcentimeter renal cyst in the posterior upper left kidney. No additional renal lesions. Stomach/Bowel: Grossly normal stomach. Visualized small and large bowel is normal caliber, with no bowel wall thickening. Vascular/Lymphatic: Normal caliber abdominal aorta. No pathologically enlarged lymph nodes in the abdomen. Other: No abdominal ascites or focal fluid collection. Musculoskeletal: No aggressive appearing focal osseous lesions. IMPRESSION: 1. Limited motion degraded scan. 2. Distended gallbladder with cholelithiasis. No specific MR findings of acute cholecystitis . 3. Minimal central intrahepatic biliary ductal dilatation. Mildly dilated common bile duct (8 mm diameter). No evidence of choledocholithiasis. 4. Stable left adrenal adenoma . Electronically Signed   By: Delbert Phenix M.D.   On: 09/20/2016 18:42   US Abdomen Complete  Result Date: 09/20/2016 CLINICAL DATA:  Abdominal pain since Friday. Elevated liver function studies. EXAM: ABDOMEN ULTRASOUND COMPLETE COMPARISON:  None. FINDINGS: Gallbladder: Numerous small layering gallstones in the gallbladder. The largest measures 6 mm. There is also some layering echogenic sludge. No gallbladder wall  thickening, pericholecystic fluid or sonographic Murphy sign to suggest acute cholecystitis. Common bile duct: Diameter: 8 mm Liver: Normal echogenicity without focal lesion or biliary dilatation. IVC: Normal caliber Pancreas: Sonographically normal Spleen: Normal size and echogenicity without focal lesions. Right Kidney: Length: 11.1 cm. Normal renal cortical thickness and echogenicity without focal lesions or hydronephrosis.  Left Kidney: Length: 11.7 cm. Normal renal cortical thickness and echogenicity without focal lesions or hydronephrosis. Abdominal aorta: Normal caliber. Other findings: Left adrenal gland adenoma is again demonstrated. IMPRESSION: 1. Cholelithiasis without sonographic findings for acute cholecystitis. 2. Dilated common bile duct could be due to a distal common bile duct stone. ERCP or MRCP may be helpful for further evaluation, particularly given the patient's elevated liver function studies. 3. Stable left adrenal gland nodule. 4. The liver, spleen, pancreas and kidneys are unremarkable. Electronically Signed   By: Rudie Meyer M.D.   On: 09/20/2016 15:43   Mr 3d Recon At Scanner  Result Date: 09/20/2016 CLINICAL DATA:  61 year old female inpatient with epigastric/right upper quadrant abdominal pain, elevated bilirubin, cholelithiasis and dilated common bile duct at sonography. EXAM: MRI ABDOMEN WITHOUT CONTRAST TECHNIQUE: Multiplanar multisequence MR imaging was performed without the administration of intravenous contrast. COMPARISON:  Abdominal sonogram from earlier today. FINDINGS: Motion degraded scan. Lower chest: Clear lung bases. Hepatobiliary: Normal liver size and configuration. No hepatic steatosis. No liver mass. Distended gallbladder contains several subcentimeter layering gallstones. No significant gallbladder wall thickening or pericholecystic fluid. Minimal central intrahepatic biliary ductal dilatation. Mildly dilated common bile duct. Common bile duct diameter 8 mm. No evidence of choledocholithiasis on the motion degraded sequences. No biliary strictures. No appreciable biliary or ampullary mass. Pancreas: No pancreatic mass or duct dilation. No evidence of pancreas divisum. Spleen: Normal size. No mass. Adrenals/Urinary Tract: Normal right adrenal. Left adrenal 2.9 cm nodule is stable since 04/01/2016 and demonstrates prominent loss of signal intensity on out of phase chemical shift imaging,  diagnostic of a benign adenoma. No hydronephrosis. Normal size kidneys. Simple appearing subcentimeter renal cyst in the posterior upper left kidney. No additional renal lesions. Stomach/Bowel: Grossly normal stomach. Visualized small and large bowel is normal caliber, with no bowel wall thickening. Vascular/Lymphatic: Normal caliber abdominal aorta. No pathologically enlarged lymph nodes in the abdomen. Other: No abdominal ascites or focal fluid collection. Musculoskeletal: No aggressive appearing focal osseous lesions. IMPRESSION: 1. Limited motion degraded scan. 2. Distended gallbladder with cholelithiasis. No specific MR findings of acute cholecystitis . 3. Minimal central intrahepatic biliary ductal dilatation. Mildly dilated common bile duct (8 mm diameter). No evidence of choledocholithiasis. 4. Stable left adrenal adenoma . Electronically Signed   By: Delbert Phenix M.D.   On: 09/20/2016 18:42        Scheduled Meds: . cefoTEtan (CEFOTAN) IV  2 g Intravenous To SS-Surg  . Chlorhexidine Gluconate Cloth  6 each Topical Once   And  . Chlorhexidine Gluconate Cloth  6 each Topical Once  . enoxaparin (LOVENOX) injection  40 mg Subcutaneous Q24H  . famotidine (PEPCID) IV  20 mg Intravenous Q12H  . omega-3 acid ethyl esters  1 g Oral Daily   Continuous Infusions: . dextrose 5% lactated ringers 100 mL/hr at 09/21/16 0525     LOS: 1 day        Coralie Keens, MD [facility] Pager [phone]  If 7PM-7AM, please contact night-coverage [email or web address] Password [email or web address] 09/21/2016, 9:22 AM

## 2016-09-21 NOTE — Progress Notes (Signed)
[facility] Progress Note     Subjective: Pt states she is feeling better. Answered pt's questions about surgery and post-op. No new complaints. No events overnight.   Objective: Vital signs in last 24 hours: Temp:  [97.9 F (36.6 C)-98.9 F (37.2 C)] 98.7 F (37.1 C) (02/05 0545) Pulse Rate:  [63-83] 68 (02/05 0545) Resp:  [18-26] 18 (02/05 0545) BP: (111-149)/(57-87) 127/65 (02/05 0545) SpO2:  [96 %-100 %] 100 % (02/05 0545) Weight:  [250 lb (113.4 kg)] 250 lb (113.4 kg) (02/04 1333)    Intake/Output from previous day: 02/04 0701 - 02/05 0700 In: 1186.7 [I.V.:1136.7; IV Piggyback:50] Out: 550 [Urine:550] Intake/Output this shift: No intake/output data recorded.  PE: Gen:  Alert, NAD, pleasant, cooperative, lying in bed, well appearing.  Card:  RRR, no M/G/R heard Pulm:  Rate and effort normal Abd: Soft, obese, nondistended, +BS Skin: no rashes noted, warm and dry, not diaphoretic  Lab Results:   Recent Labs  09/20/16 1345 09/21/16 0617  WBC 9.9 5.4  HGB 13.3 11.0*  HCT 39.9 34.3*  PLT 270 178   BMET  Recent Labs  09/20/16 1345 09/21/16 0617  NA 139 141  K 4.6 3.7  CL 99* 106  CO2 25 31  GLUCOSE 135* 102*  BUN 20 17  CREATININE 0.93 0.81  CALCIUM 10.1 9.1   PT/INR No results for input(s): LABPROT, INR in the last 72 hours. CMP     Component Value Date/Time   NA 141 09/21/2016 0617   K 3.7 09/21/2016 0617   CL 106 09/21/2016 0617   CO2 31 09/21/2016 0617   GLUCOSE 102 (H) 09/21/2016 0617   BUN 17 09/21/2016 0617   CREATININE 0.81 09/21/2016 0617   CREATININE 0.66 10/07/2015 0912   CALCIUM 9.1 09/21/2016 0617   PROT 6.0 (L) 09/21/2016 0617   ALBUMIN 3.4 (L) 09/21/2016 0617   AST 341 (H) 09/21/2016 0617   ALT 571 (H) 09/21/2016 0617   ALKPHOS 135 (H) 09/21/2016 0617   BILITOT 3.8 (H) 09/21/2016 0617   GFRNONAA >60 09/21/2016 0617   GFRNONAA >89 10/07/2015 0912   GFRAA >60 09/21/2016 0617   GFRAA >89 10/07/2015 0912    Lipase     Component Value Date/Time   LIPASE 29 09/20/2016 1345       Studies/Results: Mr Abdomen Wo Contrast  Result Date: 09/20/2016 CLINICAL DATA:  61 year old female inpatient with epigastric/right upper quadrant abdominal pain, elevated bilirubin, cholelithiasis and dilated common bile duct at sonography. EXAM: MRI ABDOMEN WITHOUT CONTRAST TECHNIQUE: Multiplanar multisequence MR imaging was performed without the administration of intravenous contrast. COMPARISON:  Abdominal sonogram from earlier today. FINDINGS: Motion degraded scan. Lower chest: Clear lung bases. Hepatobiliary: Normal liver size and configuration. No hepatic steatosis. No liver mass. Distended gallbladder contains several subcentimeter layering gallstones. No significant gallbladder wall thickening or pericholecystic fluid. Minimal central intrahepatic biliary ductal dilatation. Mildly dilated common bile duct. Common bile duct diameter 8 mm. No evidence of choledocholithiasis on the motion degraded sequences. No biliary strictures. No appreciable biliary or ampullary mass. Pancreas: No pancreatic mass or duct dilation. No evidence of pancreas divisum. Spleen: Normal size. No mass. Adrenals/Urinary Tract: Normal right adrenal. Left adrenal 2.9 cm nodule is stable since 04/01/2016 and demonstrates prominent loss of signal intensity on out of phase chemical shift imaging, diagnostic of a benign adenoma. No hydronephrosis. Normal size kidneys. Simple appearing subcentimeter renal cyst in the posterior upper left kidney. No additional renal lesions. Stomach/Bowel: Grossly normal stomach. Visualized small and  large bowel is normal caliber, with no bowel wall thickening. Vascular/Lymphatic: Normal caliber abdominal aorta. No pathologically enlarged lymph nodes in the abdomen. Other: No abdominal ascites or focal fluid collection. Musculoskeletal: No aggressive appearing focal osseous lesions. IMPRESSION: 1. Limited motion degraded  scan. 2. Distended gallbladder with cholelithiasis. No specific MR findings of acute cholecystitis . 3. Minimal central intrahepatic biliary ductal dilatation. Mildly dilated common bile duct (8 mm diameter). No evidence of choledocholithiasis. 4. Stable left adrenal adenoma . Electronically Signed   By: Ilona Sorrel M.D.   On: 09/20/2016 18:42   US Abdomen Complete  Result Date: 09/20/2016 CLINICAL DATA:  Abdominal pain since Friday. Elevated liver function studies. EXAM: ABDOMEN ULTRASOUND COMPLETE COMPARISON:  None. FINDINGS: Gallbladder: Numerous small layering gallstones in the gallbladder. The largest measures 6 mm. There is also some layering echogenic sludge. No gallbladder wall thickening, pericholecystic fluid or sonographic Murphy sign to suggest acute cholecystitis. Common bile duct: Diameter: 8 mm Liver: Normal echogenicity without focal lesion or biliary dilatation. IVC: Normal caliber Pancreas: Sonographically normal Spleen: Normal size and echogenicity without focal lesions. Right Kidney: Length: 11.1 cm. Normal renal cortical thickness and echogenicity without focal lesions or hydronephrosis. Left Kidney: Length: 11.7 cm. Normal renal cortical thickness and echogenicity without focal lesions or hydronephrosis. Abdominal aorta: Normal caliber. Other findings: Left adrenal gland adenoma is again demonstrated. IMPRESSION: 1. Cholelithiasis without sonographic findings for acute cholecystitis. 2. Dilated common bile duct could be due to a distal common bile duct stone. ERCP or MRCP may be helpful for further evaluation, particularly given the patient's elevated liver function studies. 3. Stable left adrenal gland nodule. 4. The liver, spleen, pancreas and kidneys are unremarkable. Electronically Signed   By: Marijo Sanes M.D.   On: 09/20/2016 15:43   Mr 3d Recon At Scanner  Result Date: 09/20/2016 CLINICAL DATA:  61 year old female inpatient with epigastric/right upper quadrant abdominal pain,  elevated bilirubin, cholelithiasis and dilated common bile duct at sonography. EXAM: MRI ABDOMEN WITHOUT CONTRAST TECHNIQUE: Multiplanar multisequence MR imaging was performed without the administration of intravenous contrast. COMPARISON:  Abdominal sonogram from earlier today. FINDINGS: Motion degraded scan. Lower chest: Clear lung bases. Hepatobiliary: Normal liver size and configuration. No hepatic steatosis. No liver mass. Distended gallbladder contains several subcentimeter layering gallstones. No significant gallbladder wall thickening or pericholecystic fluid. Minimal central intrahepatic biliary ductal dilatation. Mildly dilated common bile duct. Common bile duct diameter 8 mm. No evidence of choledocholithiasis on the motion degraded sequences. No biliary strictures. No appreciable biliary or ampullary mass. Pancreas: No pancreatic mass or duct dilation. No evidence of pancreas divisum. Spleen: Normal size. No mass. Adrenals/Urinary Tract: Normal right adrenal. Left adrenal 2.9 cm nodule is stable since 04/01/2016 and demonstrates prominent loss of signal intensity on out of phase chemical shift imaging, diagnostic of a benign adenoma. No hydronephrosis. Normal size kidneys. Simple appearing subcentimeter renal cyst in the posterior upper left kidney. No additional renal lesions. Stomach/Bowel: Grossly normal stomach. Visualized small and large bowel is normal caliber, with no bowel wall thickening. Vascular/Lymphatic: Normal caliber abdominal aorta. No pathologically enlarged lymph nodes in the abdomen. Other: No abdominal ascites or focal fluid collection. Musculoskeletal: No aggressive appearing focal osseous lesions. IMPRESSION: 1. Limited motion degraded scan. 2. Distended gallbladder with cholelithiasis. No specific MR findings of acute cholecystitis . 3. Minimal central intrahepatic biliary ductal dilatation. Mildly dilated common bile duct (8 mm diameter). No evidence of choledocholithiasis. 4.  Stable left adrenal adenoma . Electronically Signed   By: Rinaldo Ratel  Poff M.D.   On: 09/20/2016 18:42    Anti-infectives: Anti-infectives    Start     Dose/Rate Route Frequency Ordered Stop   09/21/16 1400  cefoTEtan (CEFOTAN) 2 g in dextrose 5 % 50 mL IVPB     2 g 100 mL/hr over 30 Minutes Intravenous To [facility] 09/21/16 0756 09/22/16 1400       Assessment/Plan  HTN Asthma  symptomatic cholelithiasis and possible choledocholithiasis - MRCP yesterday showed no stones seen in the CBD with minimal central intrahepatic biliary ductal dilatation.  - Primary consulted GI for possible ERCP - LFT's trending down - Tbili trending up - no leukocytosis, afebrile  Pt will go to OR tomorrow for lap chole, NPO.   LOS: 1 day    Kalman Drape , [facility] 09/21/2016, 10:07 AM Pager: [phone] Consults: [phone] Mon-Fri 7:00 am-4:30 pm Sat-Sun 7:00 am-11:30 am

## 2016-09-22 ENCOUNTER — Encounter (HOSPITAL_COMMUNITY)
Admission: EM | Disposition: A | Discharge status: Home / Self Care | Payer: Self-pay | Source: Home / Self Care | Attending: Internal Medicine

## 2016-09-22 ENCOUNTER — Inpatient Hospital Stay (HOSPITAL_COMMUNITY): Payer: BLUE CROSS/BLUE SHIELD | Admitting: Anesthesiology

## 2016-09-22 ENCOUNTER — Encounter (HOSPITAL_COMMUNITY): Payer: Self-pay | Admitting: Certified Registered Nurse Anesthetist

## 2016-09-22 ENCOUNTER — Inpatient Hospital Stay (HOSPITAL_COMMUNITY): Payer: BLUE CROSS/BLUE SHIELD

## 2016-09-22 HISTORY — PX: CHOLECYSTECTOMY: SHX55

## 2016-09-22 LAB — CBC WITH DIFFERENTIAL/PLATELET
BASOS ABS: 0 10*3/uL (ref 0.0–0.1)
Basophils Relative: 0 %
EOS ABS: 0.1 10*3/uL (ref 0.0–0.7)
Eosinophils Relative: 1 %
HCT: 36.1 % (ref 36.0–46.0)
HEMOGLOBIN: 11.9 g/dL — AB (ref 12.0–15.0)
LYMPHS PCT: 25 %
Lymphs Abs: 1.7 10*3/uL (ref 0.7–4.0)
MCH: 27.5 pg (ref 26.0–34.0)
MCHC: 33 g/dL (ref 30.0–36.0)
MCV: 83.4 fL (ref 78.0–100.0)
Monocytes Absolute: 0.6 10*3/uL (ref 0.1–1.0)
Monocytes Relative: 8 %
NEUTROS PCT: 66 %
Neutro Abs: 4.5 10*3/uL (ref 1.7–7.7)
PLATELETS: 176 10*3/uL (ref 150–400)
RBC: 4.33 MIL/uL (ref 3.87–5.11)
RDW: 13.9 % (ref 11.5–15.5)
WBC: 6.9 10*3/uL (ref 4.0–10.5)

## 2016-09-22 LAB — BASIC METABOLIC PANEL
ANION GAP: 11 (ref 5–15)
BUN: 13 mg/dL (ref 6–20)
CHLORIDE: 102 mmol/L (ref 101–111)
CO2: 26 mmol/L (ref 22–32)
Calcium: 9.3 mg/dL (ref 8.9–10.3)
Creatinine, Ser: 0.79 mg/dL (ref 0.44–1.00)
Glucose, Bld: 119 mg/dL — ABNORMAL HIGH (ref 65–99)
POTASSIUM: 3.8 mmol/L (ref 3.5–5.1)
SODIUM: 139 mmol/L (ref 135–145)

## 2016-09-22 SURGERY — LAPAROSCOPIC CHOLECYSTECTOMY WITH INTRAOPERATIVE CHOLANGIOGRAM
Anesthesia: General | Site: Abdomen

## 2016-09-22 MED ORDER — HYDROMORPHONE HCL 1 MG/ML IJ SOLN
INTRAMUSCULAR | Status: AC
  Filled 2016-09-22: qty 0.5

## 2016-09-22 MED ORDER — DIPHENHYDRAMINE HCL 50 MG/ML IJ SOLN
INTRAMUSCULAR | Status: DC | PRN
  Administered 2016-09-22: 12.5 mg via INTRAVENOUS

## 2016-09-22 MED ORDER — MORPHINE SULFATE (PF) 2 MG/ML IV SOLN
2.0000 mg | INTRAVENOUS | Status: DC | PRN
Start: 2016-09-22 — End: 2016-09-22
  Administered 2016-09-22: 2 mg via INTRAVENOUS
  Filled 2016-09-22: qty 1

## 2016-09-22 MED ORDER — ROCURONIUM BROMIDE 100 MG/10ML IV SOLN
INTRAVENOUS | Status: DC | PRN
  Administered 2016-09-22: 60 mg via INTRAVENOUS

## 2016-09-22 MED ORDER — MORPHINE SULFATE (PF) 2 MG/ML IV SOLN
2.0000 mg | INTRAVENOUS | Status: DC | PRN
  Administered 2016-09-22: 4 mg via INTRAVENOUS
  Administered 2016-09-22: 2 mg via INTRAVENOUS
  Administered 2016-09-22 – 2016-09-23 (×2): 4 mg via INTRAVENOUS
  Filled 2016-09-22 (×2): qty 2
  Filled 2016-09-22: qty 1
  Filled 2016-09-22: qty 2

## 2016-09-22 MED ORDER — BUPIVACAINE-EPINEPHRINE (PF) 0.5% -1:200000 IJ SOLN
INTRAMUSCULAR | Status: AC
  Filled 2016-09-22: qty 30

## 2016-09-22 MED ORDER — CEFOTETAN DISODIUM-DEXTROSE 2-2.08 GM-% IV SOLR
INTRAVENOUS | Status: AC
  Filled 2016-09-22: qty 50

## 2016-09-22 MED ORDER — ALBUTEROL SULFATE HFA 108 (90 BASE) MCG/ACT IN AERS
1.0000 | INHALATION_SPRAY | RESPIRATORY_TRACT | Status: DC
  Filled 2016-09-22: qty 6.7

## 2016-09-22 MED ORDER — SUGAMMADEX SODIUM 200 MG/2ML IV SOLN
INTRAVENOUS | Status: DC | PRN
  Administered 2016-09-22: 200 mg via INTRAVENOUS

## 2016-09-22 MED ORDER — DIPHENHYDRAMINE HCL 50 MG/ML IJ SOLN
INTRAMUSCULAR | Status: AC
  Filled 2016-09-22: qty 1

## 2016-09-22 MED ORDER — ONDANSETRON HCL 4 MG/2ML IJ SOLN
INTRAMUSCULAR | Status: AC
  Filled 2016-09-22: qty 2

## 2016-09-22 MED ORDER — LOSARTAN POTASSIUM 50 MG PO TABS
50.0000 mg | ORAL_TABLET | Freq: Every day | ORAL | Status: DC
  Administered 2016-09-23: 50 mg via ORAL
  Filled 2016-09-22: qty 1

## 2016-09-22 MED ORDER — DEXAMETHASONE SODIUM PHOSPHATE 10 MG/ML IJ SOLN
INTRAMUSCULAR | Status: DC | PRN
  Administered 2016-09-22: 10 mg via INTRAVENOUS

## 2016-09-22 MED ORDER — PROPOFOL 10 MG/ML IV BOLUS
INTRAVENOUS | Status: AC
  Filled 2016-09-22: qty 20

## 2016-09-22 MED ORDER — PROMETHAZINE HCL 25 MG/ML IJ SOLN: 6.2500 mg | INTRAMUSCULAR | Status: DC | PRN

## 2016-09-22 MED ORDER — LIDOCAINE HCL (CARDIAC) 20 MG/ML IV SOLN
INTRAVENOUS | Status: DC | PRN
  Administered 2016-09-22: 100 mg via INTRAVENOUS

## 2016-09-22 MED ORDER — 0.9 % SODIUM CHLORIDE (POUR BTL) OPTIME
TOPICAL | Status: DC | PRN
  Administered 2016-09-22: 1000 mL

## 2016-09-22 MED ORDER — SODIUM CHLORIDE 0.9 % IR SOLN
Status: DC | PRN
  Administered 2016-09-22 (×2): 1000 mL

## 2016-09-22 MED ORDER — SCOPOLAMINE 1 MG/3DAYS TD PT72
MEDICATED_PATCH | TRANSDERMAL | Status: DC | PRN
  Administered 2016-09-22: 1 via TRANSDERMAL

## 2016-09-22 MED ORDER — MORPHINE SULFATE (PF) 4 MG/ML IV SOLN
4.0000 mg | Freq: Once | INTRAVENOUS | Status: AC
  Administered 2016-09-22: 4 mg via INTRAVENOUS
  Filled 2016-09-22: qty 1

## 2016-09-22 MED ORDER — FENTANYL CITRATE (PF) 100 MCG/2ML IJ SOLN
INTRAMUSCULAR | Status: DC | PRN
  Administered 2016-09-22 (×4): 50 ug via INTRAVENOUS

## 2016-09-22 MED ORDER — MIDAZOLAM HCL 5 MG/5ML IJ SOLN
INTRAMUSCULAR | Status: DC | PRN
  Administered 2016-09-22: 2 mg via INTRAVENOUS

## 2016-09-22 MED ORDER — HYDROMORPHONE HCL 1 MG/ML IJ SOLN
0.2500 mg | INTRAMUSCULAR | Status: DC | PRN
  Administered 2016-09-22 (×2): 0.5 mg via INTRAVENOUS

## 2016-09-22 MED ORDER — FENTANYL CITRATE (PF) 100 MCG/2ML IJ SOLN
INTRAMUSCULAR | Status: AC
  Filled 2016-09-22: qty 4

## 2016-09-22 MED ORDER — HEMOSTATIC AGENTS (NO CHARGE) OPTIME
TOPICAL | Status: DC | PRN
  Administered 2016-09-22: 1 via TOPICAL

## 2016-09-22 MED ORDER — ONDANSETRON HCL 4 MG/2ML IJ SOLN
INTRAMUSCULAR | Status: DC | PRN
  Administered 2016-09-22: 4 mg via INTRAVENOUS

## 2016-09-22 MED ORDER — PHENYLEPHRINE 40 MCG/ML (10ML) SYRINGE FOR IV PUSH (FOR BLOOD PRESSURE SUPPORT)
PREFILLED_SYRINGE | INTRAVENOUS | Status: DC | PRN
  Administered 2016-09-22 (×2): 80 ug via INTRAVENOUS

## 2016-09-22 MED ORDER — SCOPOLAMINE 1 MG/3DAYS TD PT72
MEDICATED_PATCH | TRANSDERMAL | Status: AC
  Filled 2016-09-22: qty 1

## 2016-09-22 MED ORDER — ROCURONIUM BROMIDE 50 MG/5ML IV SOSY
PREFILLED_SYRINGE | INTRAVENOUS | Status: AC
  Filled 2016-09-22: qty 5

## 2016-09-22 MED ORDER — IOPAMIDOL (ISOVUE-300) INJECTION 61%
INTRAVENOUS | Status: AC
  Filled 2016-09-22: qty 50

## 2016-09-22 MED ORDER — OXYCODONE-ACETAMINOPHEN 5-325 MG PO TABS
1.0000 | ORAL_TABLET | ORAL | Status: DC | PRN
  Administered 2016-09-22 – 2016-09-23 (×3): 1 via ORAL
  Filled 2016-09-22 (×3): qty 1

## 2016-09-22 MED ORDER — BUPIVACAINE-EPINEPHRINE 0.5% -1:200000 IJ SOLN
INTRAMUSCULAR | Status: DC | PRN
  Administered 2016-09-22: 11.5 mL

## 2016-09-22 MED ORDER — SODIUM CHLORIDE 0.9 % IV SOLN
INTRAVENOUS | Status: DC | PRN
  Administered 2016-09-22: 12 mL

## 2016-09-22 MED ORDER — PROPOFOL 10 MG/ML IV BOLUS
INTRAVENOUS | Status: DC | PRN
  Administered 2016-09-22: 170 mg via INTRAVENOUS

## 2016-09-22 MED ORDER — LACTATED RINGERS IV SOLN
INTRAVENOUS | Status: DC
  Administered 2016-09-22: 10:00:00 via INTRAVENOUS

## 2016-09-22 MED ORDER — LACTATED RINGERS IV SOLN
INTRAVENOUS | Status: DC | PRN
  Administered 2016-09-22 (×2): via INTRAVENOUS

## 2016-09-22 MED ORDER — MIDAZOLAM HCL 2 MG/2ML IJ SOLN
INTRAMUSCULAR | Status: AC
  Filled 2016-09-22: qty 2

## 2016-09-22 SURGICAL SUPPLY — 43 items
ADH SKN CLS APL DERMABOND .7 (GAUZE/BANDAGES/DRESSINGS) ×1
APPLIER CLIP ROT 10 11.4 M/L (STAPLE) ×2
APR CLP MED LRG 11.4X10 (STAPLE) ×1
BAG SPEC RTRVL LRG 6X4 10 (ENDOMECHANICALS) ×1
BLADE SURG ROTATE 9660 (MISCELLANEOUS) IMPLANT
CANISTER SUCTION 2500CC (MISCELLANEOUS) ×2 IMPLANT
CHLORAPREP W/TINT 26ML (MISCELLANEOUS) ×2 IMPLANT
CLIP APPLIE ROT 10 11.4 M/L (STAPLE) ×1 IMPLANT
COVER MAYO STAND STRL (DRAPES) ×2 IMPLANT
COVER SURGICAL LIGHT HANDLE (MISCELLANEOUS) ×2 IMPLANT
DERMABOND ADVANCED (GAUZE/BANDAGES/DRESSINGS) ×1
DERMABOND ADVANCED .7 DNX12 (GAUZE/BANDAGES/DRESSINGS) ×1 IMPLANT
DRAPE C-ARM 42X72 X-RAY (DRAPES) ×2 IMPLANT
DRAPE WARM FLUID 44X44 (DRAPE) ×2 IMPLANT
ELECT REM PT RETURN 9FT ADLT (ELECTROSURGICAL) ×2
ELECTRODE REM PT RTRN 9FT ADLT (ELECTROSURGICAL) ×1 IMPLANT
GLOVE BIO SURGEON STRL SZ8 (GLOVE) ×2 IMPLANT
GLOVE BIOGEL PI IND STRL 8 (GLOVE) ×1 IMPLANT
GLOVE BIOGEL PI INDICATOR 8 (GLOVE) ×1
GOWN STRL REUS W/ TWL LRG LVL3 (GOWN DISPOSABLE) ×2 IMPLANT
GOWN STRL REUS W/ TWL XL LVL3 (GOWN DISPOSABLE) ×1 IMPLANT
GOWN STRL REUS W/TWL LRG LVL3 (GOWN DISPOSABLE) ×4
GOWN STRL REUS W/TWL XL LVL3 (GOWN DISPOSABLE) ×2
HEMOSTAT SNOW SURGICEL 2X4 (HEMOSTASIS) ×1 IMPLANT
KIT BASIN OR (CUSTOM PROCEDURE TRAY) ×2 IMPLANT
KIT ROOM TURNOVER OR (KITS) ×2 IMPLANT
LIQUID BAND (GAUZE/BANDAGES/DRESSINGS) ×2 IMPLANT
NS IRRIG 1000ML POUR BTL (IV SOLUTION) ×2 IMPLANT
PAD ARMBOARD 7.5X6 YLW CONV (MISCELLANEOUS) ×2 IMPLANT
POUCH SPECIMEN RETRIEVAL 10MM (ENDOMECHANICALS) ×2 IMPLANT
SCISSORS LAP 5X35 DISP (ENDOMECHANICALS) ×2 IMPLANT
SET CHOLANGIOGRAPH 5 50 .035 (SET/KITS/TRAYS/PACK) ×2 IMPLANT
SET IRRIG TUBING LAPAROSCOPIC (IRRIGATION / IRRIGATOR) ×2 IMPLANT
SLEEVE ENDOPATH XCEL 5M (ENDOMECHANICALS) ×2 IMPLANT
SPECIMEN JAR SMALL (MISCELLANEOUS) ×2 IMPLANT
SUT MNCRL AB 4-0 PS2 18 (SUTURE) ×2 IMPLANT
TOWEL OR 17X24 6PK STRL BLUE (TOWEL DISPOSABLE) ×2 IMPLANT
TOWEL OR 17X26 10 PK STRL BLUE (TOWEL DISPOSABLE) ×2 IMPLANT
TRAY LAPAROSCOPIC MC (CUSTOM PROCEDURE TRAY) ×2 IMPLANT
TROCAR XCEL BLUNT TIP 100MML (ENDOMECHANICALS) ×2 IMPLANT
TROCAR XCEL NON-BLD 11X100MML (ENDOMECHANICALS) ×2 IMPLANT
TROCAR XCEL NON-BLD 5MMX100MML (ENDOMECHANICALS) ×2 IMPLANT
TUBING INSUFFLATION (TUBING) ×2 IMPLANT

## 2016-09-22 NOTE — Anesthesia Postprocedure Evaluation (Addendum)
Anesthesia Post Note  Patient: Stephanie Andrade  Procedure(s) Performed: Procedure(s) (LRB): LAPAROSCOPIC CHOLECYSTECTOMY WITH INTRAOPERATIVE CHOLANGIOGRAM (N/A)  Patient location during evaluation: PACU Anesthesia Type: General Level of consciousness: sedated Pain management: pain level controlled Vital Signs Assessment: post-procedure vital signs reviewed and stable Respiratory status: spontaneous breathing and respiratory function stable Cardiovascular status: stable Anesthetic complications: no       Last Vitals:  Vitals:   09/22/16 1222 09/22/16 1238  BP: (!) 151/84   Pulse: 76 83  Resp: 14 16  Temp:      Last Pain:  Vitals:   09/22/16 1238  TempSrc:   PainSc: 10-Worst pain ever                 Carron Mcmurry DANIEL

## 2016-09-22 NOTE — Transfer of Care (Signed)
Immediate Anesthesia Transfer of Care Note  Patient: Stephanie Andrade  Procedure(s) Performed: Procedure(s): LAPAROSCOPIC CHOLECYSTECTOMY WITH INTRAOPERATIVE CHOLANGIOGRAM (N/A)  Patient Location: PACU  Anesthesia Type:General  Level of Consciousness: awake, alert , oriented and patient cooperative  Airway & Oxygen Therapy: Patient Spontanous Breathing and Patient connected to nasal cannula oxygen  Post-op Assessment: Report given to RN, Post -op Vital signs reviewed and stable and Patient moving all extremities X 4  Post vital signs: Reviewed and stable  Last Vitals:  Vitals:   09/22/16 0706 09/22/16 1219  BP: 122/62   Pulse: 70   Resp: 16   Temp: 37.1 C 36.5 C    Last Pain:  Vitals:   09/22/16 0707  TempSrc:   PainSc: 6          Complications: No apparent anesthesia complications

## 2016-09-22 NOTE — Anesthesia Preprocedure Evaluation (Addendum)
Anesthesia Evaluation  Patient identified by MRN, date of birth, ID band Patient awake    Reviewed: Allergy & Precautions, NPO status , Patient's Chart, lab work & pertinent test results  History of Anesthesia Complications (+) PONV, Family history of anesthesia reaction and history of anesthetic complications  Airway Mallampati: II  TM Distance: >3 FB Neck ROM: Full    Dental  (+) Teeth Intact, Dental Advisory Given   Pulmonary asthma , former smoker,    breath sounds clear to auscultation       Cardiovascular hypertension, Pt. on medications + Peripheral Vascular Disease  Normal cardiovascular exam Rhythm:Regular     Neuro/Psych PSYCHIATRIC DISORDERS Depression  Neuromuscular disease    GI/Hepatic negative GI ROS, Neg liver ROS,   Endo/Other  Morbid obesity  Renal/GU negative Renal ROS     Musculoskeletal  (+) Arthritis , Osteoarthritis,    Abdominal   Peds  Hematology negative hematology ROS (+)   Anesthesia Other Findings   Reproductive/Obstetrics                           Anesthesia Physical  Anesthesia Plan  ASA: III  Anesthesia Plan: General   Post-op Pain Management:    Induction: Intravenous  Airway Management Planned: Oral ETT  Additional Equipment: None  Intra-op Plan:   Post-operative Plan: Extubation in OR  Informed Consent: I have reviewed the patients History and Physical, chart, labs and discussed the procedure including the risks, benefits and alternatives for the proposed anesthesia with the patient or authorized representative who has indicated his/her understanding and acceptance.   Dental advisory given  Plan Discussed with: CRNA, Surgeon and Anesthesiologist  Anesthesia Plan Comments:        Anesthesia Quick Evaluation

## 2016-09-22 NOTE — Op Note (Signed)
Laparoscopic Cholecystectomy with IOC Procedure Note  Indications: This patient presents with symptomatic gallbladder disease and will undergo laparoscopic cholecystectomy. The procedure has been discussed with the patient. Operative and non operative treatments have been discussed. Risks of surgery include bleeding, infection,  Common bile duct injury,  Injury to the stomach,liver, colon,small intestine, abdominal wall,  Diaphragm,  Major blood vessels,  And the need for an open procedure.  Other risks include worsening of medical problems, death,  DVT and pulmonary embolism, and cardiovascular events.   Medical options have also been discussed. The patient has been informed of long term expectations of surgery and non surgical options,  The patient agrees to proceed.    Pre-operative Diagnosis: Calculus of gallbladder with acute cholecystitis, without mention of obstruction  Post-operative Diagnosis: Same  Surgeon: Oretha Weismann A.   Assistants: [place]  RNFA   Anesthesia: General endotracheal anesthesia and Local anesthesia 0.25.% bupivacaine, with epinephrine  ASA Class: 2  Procedure Details  The patient was seen again in the Holding Room. The risks, benefits, complications, treatment options, and expected outcomes were discussed with the patient. The possibilities of reaction to medication, pulmonary aspiration, perforation of viscus, bleeding, recurrent infection, finding a normal gallbladder, the need for additional procedures, failure to diagnose a condition, the possible need to convert to an open procedure, and creating a complication requiring transfusion or operation were discussed with the patient. The patient and/or family concurred with the proposed plan, giving informed consent. The site of surgery properly noted/marked. The patient was taken to Operating Room, identified as Stephanie Andrade and the procedure verified as Laparoscopic Cholecystectomy with Intraoperative  Cholangiograms. A Time Out was held and the above information confirmed.  Prior to the induction of general anesthesia, antibiotic prophylaxis was administered. General endotracheal anesthesia was then administered and tolerated well. After the induction, the abdomen was prepped in the usual sterile fashion. The patient was positioned in the supine position with the left arm comfortably tucked, along with some reverse Trendelenburg.  Local anesthetic agent was injected into the skin near the umbilicus and an incision made. The midline fascia was incised and the Hasson technique was used to introduce a 12 mm port under direct vision. It was secured with a figure of eight Vicryl suture placed in the usual fashion. Pneumoperitoneum was then created with CO2 and tolerated well without any adverse changes in the patient's vital signs. Additional trocars were introduced under direct vision with an 11 mm trocar in the epigastrium and 2 5 mm trocars in the right upper quadrant. All skin incisions were infiltrated with a local anesthetic agent before making the incision and placing the trocars.   The gallbladder was identified, the fundus grasped and retracted cephalad. Adhesions were lysed bluntly and with the electrocautery where indicated, taking care not to injure any adjacent organs or viscus. The infundibulum was grasped and retracted laterally, exposing the peritoneum overlying the triangle of Calot. This was then divided and exposed in a blunt fashion. The cystic duct was clearly identified and bluntly dissected circumferentially. The junctions of the gallbladder, cystic duct and common bile duct were clearly identified prior to the division of any linear structure.   An incision was made in the cystic duct and the cholangiogram catheter introduced. The catheter was secured using an endoclip. The study showed no stones and good visualization of the distal and proximal biliary tree. The catheter was then  removed.   The cystic duct was then  ligated with surgical clips  on  the patient side and  clipped on the gallbladder side and divided. The cystic artery was identified, dissected free, ligated with clips and divided as well. Posterior cystic artery clipped and divided.  The gallbladder was dissected from the liver bed in retrograde fashion with the electrocautery. The gallbladder was removed. The liver bed was irrigated and inspected. Hemostasis was achieved with the electrocautery. Copious irrigation was utilized and was repeatedly aspirated until clear all particulate matter. Hemostasis was achieved with no signs  Of bleeding or bile leakage.  Pneumoperitoneum was completely reduced after viewing removal of the trocars under direct vision. The wound was thoroughly irrigated and the fascia was then closed with a figure of eight suture; the skin was then closed with 4 0 MONOCRYL and a sterile dressing of Dermabond was applied.  Instrument, sponge, and needle counts were correct at closure and at the conclusion of the case.   Findings: Cholecystitis with Cholelithiasis  Estimated Blood Loss: less than 50 mL         Drains: none          Total IV Fluids: 800 mL         Specimens: Gallbladder           Complications: None; patient tolerated the procedure well.         Disposition: PACU - hemodynamically stable.         Condition: stable

## 2016-09-22 NOTE — Progress Notes (Signed)
[facility] Progress Note     Subjective: Pt began having RUQ abdominal pain radiating into her back again last night. No vomiting, fever or chills.   Objective: Vital signs in last 24 hours: Temp:  [98.7 F (37.1 C)-98.8 F (37.1 C)] 98.8 F (37.1 C) (02/06 0706) Pulse Rate:  [70-73] 70 (02/06 0706) Resp:  [16-18] 16 (02/06 0706) BP: (118-132)/(54-70) 122/62 (02/06 0706) SpO2:  [98 %-100 %] 100 % (02/06 0706) Last BM Date: 09/20/16  Intake/Output from previous day: 02/05 0701 - 02/06 0700 In: 3250 [P.O.:750; I.V.:2400; IV Piggyback:100] Out: 2350 [Urine:2350] Intake/Output this shift: No intake/output data recorded.  PE: Gen:  Alert, NAD, pleasant, cooperative, lying in bed, well appearing.  Card:  RRR, no M/G/R heard Pulm:  Rate and effort normal Abd: Soft, obese, nondistended, +BS, moderate TTP in the RUQ and epigastric region Skin: no rashes noted, warm and dry, not diaphoretic  Lab Results:   Recent Labs  09/20/16 1345 09/21/16 0617  WBC 9.9 5.4  HGB 13.3 11.0*  HCT 39.9 34.3*  PLT 270 178   BMET  Recent Labs  09/20/16 1345 09/21/16 0617  NA 139 141  K 4.6 3.7  CL 99* 106  CO2 25 31  GLUCOSE 135* 102*  BUN 20 17  CREATININE 0.93 0.81  CALCIUM 10.1 9.1   PT/INR No results for input(s): LABPROT, INR in the last 72 hours. CMP     Component Value Date/Time   NA 141 09/21/2016 0617   K 3.7 09/21/2016 0617   CL 106 09/21/2016 0617   CO2 31 09/21/2016 0617   GLUCOSE 102 (H) 09/21/2016 0617   BUN 17 09/21/2016 0617   CREATININE 0.81 09/21/2016 0617   CREATININE 0.66 10/07/2015 0912   CALCIUM 9.1 09/21/2016 0617   PROT 6.0 (L) 09/21/2016 0617   ALBUMIN 3.4 (L) 09/21/2016 0617   AST 341 (H) 09/21/2016 0617   ALT 571 (H) 09/21/2016 0617   ALKPHOS 135 (H) 09/21/2016 0617   BILITOT 3.8 (H) 09/21/2016 0617   GFRNONAA >60 09/21/2016 0617   GFRNONAA >89 10/07/2015 0912   GFRAA >60 09/21/2016 0617   GFRAA >89 10/07/2015 0912    Lipase     Component Value Date/Time   LIPASE 29 09/20/2016 1345       Studies/Results: Mr Abdomen Wo Contrast  Result Date: 09/20/2016 CLINICAL DATA:  61 year old female inpatient with epigastric/right upper quadrant abdominal pain, elevated bilirubin, cholelithiasis and dilated common bile duct at sonography. EXAM: MRI ABDOMEN WITHOUT CONTRAST TECHNIQUE: Multiplanar multisequence MR imaging was performed without the administration of intravenous contrast. COMPARISON:  Abdominal sonogram from earlier today. FINDINGS: Motion degraded scan. Lower chest: Clear lung bases. Hepatobiliary: Normal liver size and configuration. No hepatic steatosis. No liver mass. Distended gallbladder contains several subcentimeter layering gallstones. No significant gallbladder wall thickening or pericholecystic fluid. Minimal central intrahepatic biliary ductal dilatation. Mildly dilated common bile duct. Common bile duct diameter 8 mm. No evidence of choledocholithiasis on the motion degraded sequences. No biliary strictures. No appreciable biliary or ampullary mass. Pancreas: No pancreatic mass or duct dilation. No evidence of pancreas divisum. Spleen: Normal size. No mass. Adrenals/Urinary Tract: Normal right adrenal. Left adrenal 2.9 cm nodule is stable since 04/01/2016 and demonstrates prominent loss of signal intensity on out of phase chemical shift imaging, diagnostic of a benign adenoma. No hydronephrosis. Normal size kidneys. Simple appearing subcentimeter renal cyst in the posterior upper left kidney. No additional renal lesions. Stomach/Bowel: Grossly normal stomach. Visualized small and large bowel  is normal caliber, with no bowel wall thickening. Vascular/Lymphatic: Normal caliber abdominal aorta. No pathologically enlarged lymph nodes in the abdomen. Other: No abdominal ascites or focal fluid collection. Musculoskeletal: No aggressive appearing focal osseous lesions. IMPRESSION: 1. Limited motion degraded  scan. 2. Distended gallbladder with cholelithiasis. No specific MR findings of acute cholecystitis . 3. Minimal central intrahepatic biliary ductal dilatation. Mildly dilated common bile duct (8 mm diameter). No evidence of choledocholithiasis. 4. Stable left adrenal adenoma . Electronically Signed   By: Ilona Sorrel M.D.   On: 09/20/2016 18:42   US Abdomen Complete  Result Date: 09/20/2016 CLINICAL DATA:  Abdominal pain since Friday. Elevated liver function studies. EXAM: ABDOMEN ULTRASOUND COMPLETE COMPARISON:  None. FINDINGS: Gallbladder: Numerous small layering gallstones in the gallbladder. The largest measures 6 mm. There is also some layering echogenic sludge. No gallbladder wall thickening, pericholecystic fluid or sonographic Murphy sign to suggest acute cholecystitis. Common bile duct: Diameter: 8 mm Liver: Normal echogenicity without focal lesion or biliary dilatation. IVC: Normal caliber Pancreas: Sonographically normal Spleen: Normal size and echogenicity without focal lesions. Right Kidney: Length: 11.1 cm. Normal renal cortical thickness and echogenicity without focal lesions or hydronephrosis. Left Kidney: Length: 11.7 cm. Normal renal cortical thickness and echogenicity without focal lesions or hydronephrosis. Abdominal aorta: Normal caliber. Other findings: Left adrenal gland adenoma is again demonstrated. IMPRESSION: 1. Cholelithiasis without sonographic findings for acute cholecystitis. 2. Dilated common bile duct could be due to a distal common bile duct stone. ERCP or MRCP may be helpful for further evaluation, particularly given the patient's elevated liver function studies. 3. Stable left adrenal gland nodule. 4. The liver, spleen, pancreas and kidneys are unremarkable. Electronically Signed   By: Marijo Sanes M.D.   On: 09/20/2016 15:43   Mr 3d Recon At Scanner  Result Date: 09/20/2016 CLINICAL DATA:  61 year old female inpatient with epigastric/right upper quadrant abdominal pain,  elevated bilirubin, cholelithiasis and dilated common bile duct at sonography. EXAM: MRI ABDOMEN WITHOUT CONTRAST TECHNIQUE: Multiplanar multisequence MR imaging was performed without the administration of intravenous contrast. COMPARISON:  Abdominal sonogram from earlier today. FINDINGS: Motion degraded scan. Lower chest: Clear lung bases. Hepatobiliary: Normal liver size and configuration. No hepatic steatosis. No liver mass. Distended gallbladder contains several subcentimeter layering gallstones. No significant gallbladder wall thickening or pericholecystic fluid. Minimal central intrahepatic biliary ductal dilatation. Mildly dilated common bile duct. Common bile duct diameter 8 mm. No evidence of choledocholithiasis on the motion degraded sequences. No biliary strictures. No appreciable biliary or ampullary mass. Pancreas: No pancreatic mass or duct dilation. No evidence of pancreas divisum. Spleen: Normal size. No mass. Adrenals/Urinary Tract: Normal right adrenal. Left adrenal 2.9 cm nodule is stable since 04/01/2016 and demonstrates prominent loss of signal intensity on out of phase chemical shift imaging, diagnostic of a benign adenoma. No hydronephrosis. Normal size kidneys. Simple appearing subcentimeter renal cyst in the posterior upper left kidney. No additional renal lesions. Stomach/Bowel: Grossly normal stomach. Visualized small and large bowel is normal caliber, with no bowel wall thickening. Vascular/Lymphatic: Normal caliber abdominal aorta. No pathologically enlarged lymph nodes in the abdomen. Other: No abdominal ascites or focal fluid collection. Musculoskeletal: No aggressive appearing focal osseous lesions. IMPRESSION: 1. Limited motion degraded scan. 2. Distended gallbladder with cholelithiasis. No specific MR findings of acute cholecystitis . 3. Minimal central intrahepatic biliary ductal dilatation. Mildly dilated common bile duct (8 mm diameter). No evidence of choledocholithiasis. 4.  Stable left adrenal adenoma . Electronically Signed   By: Janina Mayo.D.  On: 09/20/2016 18:42    Anti-infectives: Anti-infectives    Start     Dose/Rate Route Frequency Ordered Stop   09/21/16 1400  cefoTEtan (CEFOTAN) 2 g in dextrose 5 % 50 mL IVPB     2 g 100 mL/hr over 30 Minutes Intravenous To [facility] 09/21/16 0756 09/22/16 1400       Assessment/Plan  HTN Asthma  symptomatic cholelithiasis and possible choledocholithiasis - MRCP 2/4 showed no stones seen in the CBD with minimal central intrahepatic biliary ductal dilatation.  - Primary consulted GI for possible ERCP - LFT's trending down as of yesterday - Tbili trending up - no leukocytosis, afebrile  AM labs pending. Pt will go to OR today for lap chole, NPO. Was scheduled for yesterday but emergency surgeries required that we move her to today.    LOS: 2 days    Kalman Drape , [facility] 09/22/2016, 7:50 AM Pager: [phone] Consults: [phone] Mon-Fri 7:00 am-4:30 pm Sat-Sun 7:00 am-11:30 am

## 2016-09-22 NOTE — Progress Notes (Signed)
PROGRESS NOTE    Stephanie Andrade  UUV:[id number] DOB: Jul 19, 1956 DOA: 09/20/2016 PCP: Margaree Mackintosh, MD     Brief Narrative:  This is a 61 year old female with history of hypertension who presents to the hospital with biliary colic ongoing for last 48 hours with acute exacerbation today. On the physical examination she looks dehydrated, blood pressure 134/87, heart rate 72, respiratory 20, oxygen saturation 100% on room air. Conjunctiva without pallor or icterus, oral mucosa is dry, the abdomen is mildly distended, Murphy sign negative, no peritoneal signs. Sodium 139, potassium 4.6, chloride 99, bicarbonate 25, glucose 135, BUN 20, creatinine 0.93, alkaline phosphatase 147, lipase 29, AST 547, ALT 644, total bilirubin is 2.8. White count 9.9, hemoglobin 13.3, hematocrit 39.9, platelets 270. Urine analysis negative for infection. Abdominal ultrasonography showing cholelithiasis without sonographic findings of acute cholecystitis, dilated colon bile duct, 8 mm. Admitted with the working diagnosis: Biliary colic due to cholelithiasis complicated by common bile duct obstruction with hyperbilirubinemia and elevated transaminases. Surgery delayed 24 hours.    Assessment & Plan:   Active Problems:   Biliary obstruction   Cholelithiases   1. Cholelithiasis. Suspected passing stone. Pain has been recurrent. Surgery was delayed for 24 hours, plan for cholecystectomy today with cholangiogram. Continue maintenance IV fluids.  IV analgesics and antiemetics.   2. HTN. Blood pressure systolic 150, will continue monitoring. Resume losartan in am with holding parameters.   3. Asthma. Stable with no signs of exacerbation, will continue as needed albuerol.  4. Obesity. Continue bupropion.    Subjective: Patient with severe abdominal pain, associated with nausea, colicky in nature with no radiation.   Objective: Vitals:   09/21/16 0545 09/21/16 1455 09/21/16 2210 09/22/16 0706  BP: 127/65 132/70  (!) 118/54 122/62  Pulse: 68 73 70 70  Resp: 18 18 18 16   Temp: 98.7 F (37.1 C)  98.7 F (37.1 C) 98.8 F (37.1 C)  TempSrc: Oral Axillary Oral Oral  SpO2: 100% 100% 98% 100%  Weight:      Height:        Intake/Output Summary (Last 24 hours) at 09/22/16 0852 Last data filed at 09/22/16 0700  Gross per 24 hour  Intake             3250 ml  Output             2150 ml  Net             1100 ml   Filed Weights   09/20/16 1333  Weight: 113.4 kg (250 lb)    Examination:  General exam: deconditioned and in pain E ENT. No pallor or icterus. Respiratory system: Clear to auscultation. Respiratory effort normal. Cardiovascular system: S1 & S2 heard, RRR. No JVD, murmurs, rubs, gallops or clicks. No pedal edema. Gastrointestinal system: Abdomen is nondistended, soft. Positive  No organomegaly or masses felt. Normal bowel sounds heard. Central nervous system: Alert and oriented. No focal neurological deficits. Extremities: Symmetric 5 x 5 power. Skin: No rashes, lesions or ulcers Psychiatry: Judgement and insight appear normal. Mood & affect appropriate.     Data Reviewed: I have personally reviewed following labs and imaging studies  CBC:  Recent Labs Lab 09/20/16 1345 09/21/16 0617  WBC 9.9 5.4  HGB 13.3 11.0*  HCT 39.9 34.3*  MCV 81.9 83.7  PLT 270 178   Basic Metabolic Panel:  Recent Labs Lab 09/20/16 1345 09/21/16 0617  NA 139 141  K 4.6 3.7  CL 99* 106  CO2 25  31  GLUCOSE 135* 102*  BUN 20 17  CREATININE 0.93 0.81  CALCIUM 10.1 9.1   GFR: Estimated Creatinine Clearance: 98.8 mL/min (by C-G formula based on SCr of 0.81 mg/dL). Liver Function Tests:  Recent Labs Lab 09/20/16 1345 09/21/16 0617  AST 547* 341*  ALT 644* 571*  ALKPHOS 147* 135*  BILITOT 2.8* 3.8*  PROT 7.9 6.0*  ALBUMIN 4.5 3.4*    Recent Labs Lab 09/20/16 1345  LIPASE 29   No results for input(s): AMMONIA in the last 168 hours. Coagulation Profile: No results for  input(s): INR, PROTIME in the last 168 hours. Cardiac Enzymes: No results for input(s): CKTOTAL, CKMB, CKMBINDEX, TROPONINI in the last 168 hours. BNP (last 3 results) No results for input(s): PROBNP in the last 8760 hours. HbA1C: No results for input(s): HGBA1C in the last 72 hours. CBG: No results for input(s): GLUCAP in the last 168 hours. Lipid Profile: No results for input(s): CHOL, HDL, LDLCALC, TRIG, CHOLHDL, LDLDIRECT in the last 72 hours. Thyroid Function Tests: No results for input(s): TSH, T4TOTAL, FREET4, T3FREE, THYROIDAB in the last 72 hours. Anemia Panel: No results for input(s): VITAMINB12, FOLATE, FERRITIN, TIBC, IRON, RETICCTPCT in the last 72 hours. Sepsis Labs: No results for input(s): PROCALCITON, LATICACIDVEN in the last 168 hours.  Recent Results (from the past 240 hour(s))  MRSA PCR Screening     Status: None   Collection Time: 09/21/16  8:40 AM  Result Value Ref Range Status   MRSA by PCR NEGATIVE NEGATIVE Final    Comment:        The GeneXpert MRSA Assay (FDA approved for NASAL specimens only), is one component of a comprehensive MRSA colonization surveillance program. It is not intended to diagnose MRSA infection nor to guide or monitor treatment for MRSA infections.          Radiology Studies: Mr Abdomen Wo Contrast  Result Date: 09/20/2016 CLINICAL DATA:  61 year old female inpatient with epigastric/right upper quadrant abdominal pain, elevated bilirubin, cholelithiasis and dilated common bile duct at sonography. EXAM: MRI ABDOMEN WITHOUT CONTRAST TECHNIQUE: Multiplanar multisequence MR imaging was performed without the administration of intravenous contrast. COMPARISON:  Abdominal sonogram from earlier today. FINDINGS: Motion degraded scan. Lower chest: Clear lung bases. Hepatobiliary: Normal liver size and configuration. No hepatic steatosis. No liver mass. Distended gallbladder contains several subcentimeter layering gallstones. No significant  gallbladder wall thickening or pericholecystic fluid. Minimal central intrahepatic biliary ductal dilatation. Mildly dilated common bile duct. Common bile duct diameter 8 mm. No evidence of choledocholithiasis on the motion degraded sequences. No biliary strictures. No appreciable biliary or ampullary mass. Pancreas: No pancreatic mass or duct dilation. No evidence of pancreas divisum. Spleen: Normal size. No mass. Adrenals/Urinary Tract: Normal right adrenal. Left adrenal 2.9 cm nodule is stable since 04/01/2016 and demonstrates prominent loss of signal intensity on out of phase chemical shift imaging, diagnostic of a benign adenoma. No hydronephrosis. Normal size kidneys. Simple appearing subcentimeter renal cyst in the posterior upper left kidney. No additional renal lesions. Stomach/Bowel: Grossly normal stomach. Visualized small and large bowel is normal caliber, with no bowel wall thickening. Vascular/Lymphatic: Normal caliber abdominal aorta. No pathologically enlarged lymph nodes in the abdomen. Other: No abdominal ascites or focal fluid collection. Musculoskeletal: No aggressive appearing focal osseous lesions. IMPRESSION: 1. Limited motion degraded scan. 2. Distended gallbladder with cholelithiasis. No specific MR findings of acute cholecystitis . 3. Minimal central intrahepatic biliary ductal dilatation. Mildly dilated common bile duct (8 mm diameter). No evidence of choledocholithiasis.  4. Stable left adrenal adenoma . Electronically Signed   By: Delbert Phenix M.D.   On: 09/20/2016 18:42   US Abdomen Complete  Result Date: 09/20/2016 CLINICAL DATA:  Abdominal pain since Friday. Elevated liver function studies. EXAM: ABDOMEN ULTRASOUND COMPLETE COMPARISON:  None. FINDINGS: Gallbladder: Numerous small layering gallstones in the gallbladder. The largest measures 6 mm. There is also some layering echogenic sludge. No gallbladder wall thickening, pericholecystic fluid or sonographic Murphy sign to suggest  acute cholecystitis. Common bile duct: Diameter: 8 mm Liver: Normal echogenicity without focal lesion or biliary dilatation. IVC: Normal caliber Pancreas: Sonographically normal Spleen: Normal size and echogenicity without focal lesions. Right Kidney: Length: 11.1 cm. Normal renal cortical thickness and echogenicity without focal lesions or hydronephrosis. Left Kidney: Length: 11.7 cm. Normal renal cortical thickness and echogenicity without focal lesions or hydronephrosis. Abdominal aorta: Normal caliber. Other findings: Left adrenal gland adenoma is again demonstrated. IMPRESSION: 1. Cholelithiasis without sonographic findings for acute cholecystitis. 2. Dilated common bile duct could be due to a distal common bile duct stone. ERCP or MRCP may be helpful for further evaluation, particularly given the patient's elevated liver function studies. 3. Stable left adrenal gland nodule. 4. The liver, spleen, pancreas and kidneys are unremarkable. Electronically Signed   By: Rudie Meyer M.D.   On: 09/20/2016 15:43   Mr 3d Recon At Scanner  Result Date: 09/20/2016 CLINICAL DATA:  61 year old female inpatient with epigastric/right upper quadrant abdominal pain, elevated bilirubin, cholelithiasis and dilated common bile duct at sonography. EXAM: MRI ABDOMEN WITHOUT CONTRAST TECHNIQUE: Multiplanar multisequence MR imaging was performed without the administration of intravenous contrast. COMPARISON:  Abdominal sonogram from earlier today. FINDINGS: Motion degraded scan. Lower chest: Clear lung bases. Hepatobiliary: Normal liver size and configuration. No hepatic steatosis. No liver mass. Distended gallbladder contains several subcentimeter layering gallstones. No significant gallbladder wall thickening or pericholecystic fluid. Minimal central intrahepatic biliary ductal dilatation. Mildly dilated common bile duct. Common bile duct diameter 8 mm. No evidence of choledocholithiasis on the motion degraded sequences. No  biliary strictures. No appreciable biliary or ampullary mass. Pancreas: No pancreatic mass or duct dilation. No evidence of pancreas divisum. Spleen: Normal size. No mass. Adrenals/Urinary Tract: Normal right adrenal. Left adrenal 2.9 cm nodule is stable since 04/01/2016 and demonstrates prominent loss of signal intensity on out of phase chemical shift imaging, diagnostic of a benign adenoma. No hydronephrosis. Normal size kidneys. Simple appearing subcentimeter renal cyst in the posterior upper left kidney. No additional renal lesions. Stomach/Bowel: Grossly normal stomach. Visualized small and large bowel is normal caliber, with no bowel wall thickening. Vascular/Lymphatic: Normal caliber abdominal aorta. No pathologically enlarged lymph nodes in the abdomen. Other: No abdominal ascites or focal fluid collection. Musculoskeletal: No aggressive appearing focal osseous lesions. IMPRESSION: 1. Limited motion degraded scan. 2. Distended gallbladder with cholelithiasis. No specific MR findings of acute cholecystitis . 3. Minimal central intrahepatic biliary ductal dilatation. Mildly dilated common bile duct (8 mm diameter). No evidence of choledocholithiasis. 4. Stable left adrenal adenoma . Electronically Signed   By: Delbert Phenix M.D.   On: 09/20/2016 18:42        Scheduled Meds: . buPROPion  75 mg Oral BID  . cefoTEtan (CEFOTAN) IV  2 g Intravenous To SS-Surg  . Chlorhexidine Gluconate Cloth  6 each Topical Once  . enoxaparin (LOVENOX) injection  40 mg Subcutaneous Q24H  . famotidine (PEPCID) IV  20 mg Intravenous Q12H  . omega-3 acid ethyl esters  1 g Oral Daily  Continuous Infusions: . dextrose 5% lactated ringers 100 mL/hr at 09/22/16 0247     LOS: 2 days     Coralie Keens, MD [facility] Pager [phone]  If 7PM-7AM, please contact night-coverage [email or web address] Password [email or web address] 09/22/2016, 8:52 AM

## 2016-09-22 NOTE — Anesthesia Procedure Notes (Signed)
Procedure Name: Intubation Date/Time: 09/22/2016 10:47 AM Performed by: Carney Living Pre-anesthesia Checklist: Patient identified, Emergency Drugs available, Suction available, Patient being monitored and Timeout performed Patient Re-evaluated:Patient Re-evaluated prior to inductionOxygen Delivery Method: Circle system utilized Preoxygenation: Pre-oxygenation with 100% oxygen Intubation Type: IV induction Ventilation: Mask ventilation without difficulty Laryngoscope Size: Mac and 4 Grade View: Grade I Tube type: Oral Tube size: 7.0 mm Number of attempts: 1 Airway Equipment and Method: Stylet Placement Confirmation: ETT inserted through vocal cords under direct vision,  positive ETCO2 and breath sounds checked- equal and bilateral Secured at: 22 cm Tube secured with: Tape Dental Injury: Teeth and Oropharynx as per pre-operative assessment

## 2016-09-22 NOTE — Progress Notes (Addendum)
0920 NPO post midnight maint. Report given to pre op. Pt to pre op via bed. Family present. 1330 Received pt back from PACU, A&O x4. Lap sites to abd with skin glue dry and intact.

## 2016-09-23 ENCOUNTER — Encounter (HOSPITAL_COMMUNITY): Payer: Self-pay | Admitting: Surgery

## 2016-09-23 DIAGNOSIS — K8001 Calculus of gallbladder with acute cholecystitis with obstruction: Secondary | ICD-10-CM

## 2016-09-23 LAB — BASIC METABOLIC PANEL
Anion gap: 10 (ref 5–15)
BUN: 10 mg/dL (ref 6–20)
CALCIUM: 9 mg/dL (ref 8.9–10.3)
CO2: 31 mmol/L (ref 22–32)
CREATININE: 0.78 mg/dL (ref 0.44–1.00)
Chloride: 98 mmol/L — ABNORMAL LOW (ref 101–111)
GFR calc non Af Amer: >60 mL/min (ref >60)
Glucose, Bld: 131 mg/dL — ABNORMAL HIGH (ref 65–99)
Potassium: 4.2 mmol/L (ref 3.5–5.1)
SODIUM: 139 mmol/L (ref 135–145)

## 2016-09-23 LAB — CBC WITH DIFFERENTIAL/PLATELET
BASOS PCT: 0 %
Basophils Absolute: 0 10*3/uL (ref 0.0–0.1)
EOS ABS: 0 10*3/uL (ref 0.0–0.7)
Eosinophils Relative: 0 %
HCT: 33.1 % — ABNORMAL LOW (ref 36.0–46.0)
HEMOGLOBIN: 10.6 g/dL — AB (ref 12.0–15.0)
Lymphocytes Relative: 19 %
Lymphs Abs: 1.7 10*3/uL (ref 0.7–4.0)
MCH: 27 pg (ref 26.0–34.0)
MCHC: 32 g/dL (ref 30.0–36.0)
MCV: 84.2 fL (ref 78.0–100.0)
MONOS PCT: 10 %
Monocytes Absolute: 0.9 10*3/uL (ref 0.1–1.0)
NEUTROS PCT: 71 %
Neutro Abs: 6.5 10*3/uL (ref 1.7–7.7)
Platelets: 172 10*3/uL (ref 150–400)
RBC: 3.93 MIL/uL (ref 3.87–5.11)
RDW: 13.5 % (ref 11.5–15.5)
WBC: 9.1 10*3/uL (ref 4.0–10.5)

## 2016-09-23 LAB — HEPATIC FUNCTION PANEL
ALBUMIN: 3.2 g/dL — AB (ref 3.5–5.0)
ALK PHOS: 128 U/L — AB (ref 38–126)
ALT: 294 U/L — ABNORMAL HIGH (ref 14–54)
AST: 85 U/L — ABNORMAL HIGH (ref 15–41)
Bilirubin, Direct: 0.6 mg/dL — ABNORMAL HIGH (ref 0.1–0.5)
Indirect Bilirubin: 1.1 mg/dL — ABNORMAL HIGH (ref 0.3–0.9)
TOTAL PROTEIN: 6 g/dL — AB (ref 6.5–8.1)
Total Bilirubin: 1.7 mg/dL — ABNORMAL HIGH (ref 0.3–1.2)

## 2016-09-23 MED ORDER — POLYETHYLENE GLYCOL 3350 17 G PO PACK: 17.0000 g | PACK | Freq: Every day | ORAL | 0 refills | Status: DC

## 2016-09-23 MED ORDER — OXYCODONE HCL 5 MG PO TABS: 5.0000 mg | ORAL_TABLET | ORAL | 0 refills | Status: DC | PRN

## 2016-09-23 MED ORDER — OXYCODONE HCL 5 MG PO TABS
5.0000 mg | ORAL_TABLET | ORAL | Status: DC | PRN
  Administered 2016-09-23: 10 mg via ORAL
  Filled 2016-09-23: qty 2

## 2016-09-23 MED ORDER — FAMOTIDINE 20 MG PO TABS
40.0000 mg | ORAL_TABLET | Freq: Every day | ORAL | Status: DC
  Administered 2016-09-23: 40 mg via ORAL
  Filled 2016-09-23: qty 2

## 2016-09-23 MED ORDER — POLYETHYLENE GLYCOL 3350 17 G PO PACK
17.0000 g | PACK | Freq: Every day | ORAL | Status: DC
  Administered 2016-09-23: 17 g via ORAL
  Filled 2016-09-23: qty 1

## 2016-09-23 MED ORDER — DOCUSATE SODIUM 100 MG PO CAPS: 100.0000 mg | ORAL_CAPSULE | Freq: Every day | ORAL | 0 refills | Status: DC

## 2016-09-23 MED ORDER — DOCUSATE SODIUM 100 MG PO CAPS
100.0000 mg | ORAL_CAPSULE | Freq: Every day | ORAL | Status: DC
  Administered 2016-09-23: 100 mg via ORAL
  Filled 2016-09-23: qty 1

## 2016-09-23 MED ORDER — FAMOTIDINE 40 MG PO TABS: 40.0000 mg | ORAL_TABLET | Freq: Every day | ORAL | 0 refills | Status: DC

## 2016-09-23 NOTE — Discharge Summary (Signed)
Physician Discharge Summary  Stephanie Andrade UJW:[id number] DOB: [birth year]  PCP: Margaree Mackintosh, MD  Admit date: 09/20/2016 Discharge date: 09/23/2016  Recommendations for Outpatient Follow-up:  1. Dr. Margaree Mackintosh, PCP in 5 days with repeat labs (CBC & CMP). 2. [facility], in 2 weeks for postop follow-up.  Home Health: None Equipment/Devices: None    Discharge Condition: Improved and stable  CODE STATUS: Full  Diet recommendation: Heart healthy diet.  Discharge Diagnoses:  Active Problems:   Biliary obstruction   Cholelithiases   Brief/Interim Summary: 61 year old female with PMH of asthma, depression, anemia, HTN presented to the hospital with complaints of epigastric/RUQ abdominal pain. It occurred abruptly, 10/10 in severity, radiated to the sides bilaterally, colicky in nature, associated with nausea and diaphoresis without aggravating or relieving factors. This pain had been going on for 48 hours prior to admission. She was assessed to have biliary colic secondary to cholelithiasis with CBD obstruction with hyperbilirubinemia and abnormal LFTs. General surgery were consulted.  Assessment and plan:  1. Symptomatic cholelithiasis and possible choledocholithiasis: General surgery was consulted. MRCP showed distended gallbladder with cholelithiasis, no acute cholecystitis and no evidence of choledocholithiasis. She underwent laparoscopic cholecystectomy under general anesthesia on 09/22/16. IOC showed patent ducts. Postop diagnosis: Calculus of gallbladder with acute cholecystitis. Diet was gradually advanced which she has tolerated. She has appropriate mild lap site pain/soreness. Passing flatus. No BM yet. Surgery has seen her (discussed with surgeon) and has cleared her for discharge home without antibiotics with plans to follow-up outpatient in 2 weeks. LFTs are improving. Bowel regimen and Pepcid were initiated by surgeons. Continue same. Patient was also counseled to  avoid acetaminophen due to abnormal LFTs and she verbalized understanding. 2. Essential hypertension: Controlled. Continue losartan. 3. Anemia: Stable. Follow CBC as outpatient next week. 4. Asthma: Stable. 5. Depression: Continue home medications. 6. Obesity/Body mass index is 38.01 kg/m.  7. Stable left adrenal adenoma: Seen on MRCP. Outpatient follow-up as deemed necessary.   Consultations  General surgery  Procedures  Laparoscopic cholecystectomy with [facility] 09/22/16     Discharge Instructions  Discharge Instructions    Call MD for:  difficulty breathing, headache or visual disturbances    Complete by:  As directed    Call MD for:  extreme fatigue    Complete by:  As directed    Call MD for:  persistant dizziness or light-headedness    Complete by:  As directed    Call MD for:  persistant nausea and vomiting    Complete by:  As directed    Call MD for:  redness, tenderness, or signs of infection (pain, swelling, redness, odor or green/yellow discharge around incision site)    Complete by:  As directed    Call MD for:  severe uncontrolled pain    Complete by:  As directed    Call MD for:  temperature >100.4    Complete by:  As directed    Diet - low sodium heart healthy    Complete by:  As directed    Increase activity slowly    Complete by:  As directed        Medication List    STOP taking these medications   acetaminophen 500 MG tablet Commonly known as:  TYLENOL   aspirin 325 MG EC tablet     TAKE these medications   albuterol 108 (90 Base) MCG/ACT inhaler Commonly known as:  PROVENTIL HFA;VENTOLIN HFA Inhale 2 puffs into the lungs every 6 (six) hours  as needed for wheezing or shortness of breath.   CONTRAVE 8-90 MG Tb12 Generic drug:  Naltrexone-Bupropion HCl ER TAKE 1 TABLET TWICE DAILY.   docusate sodium 100 MG capsule Commonly known as:  COLACE Take 1 capsule (100 mg total) by mouth daily. Start taking on:  09/24/2016   famotidine 40 MG  tablet Commonly known as:  PEPCID Take 1 tablet (40 mg total) by mouth daily. Start taking on:  09/24/2016   Fish Oil 1000 MG Caps Take 1,000 mg by mouth daily.   losartan 50 MG tablet Commonly known as:  COZAAR Take 1 tablet (50 mg total) by mouth daily.   meloxicam 15 MG tablet Commonly known as:  MOBIC TAKE 1 TABLET ONCE DAILY.   methocarbamol 500 MG tablet Commonly known as:  ROBAXIN Take 1 tablet (500 mg total) by mouth every 6 (six) hours as needed for muscle spasms.   oxyCODONE 5 MG immediate release tablet Commonly known as:  Oxy IR/ROXICODONE Take 1 tablet (5 mg total) by mouth every 4 (four) hours as needed (5mg  for moderate pain, 10mg  for severe pain).   polyethylene glycol packet Commonly known as:  MIRALAX / GLYCOLAX Take 17 g by mouth daily. Start taking on:  09/24/2016   Turmeric 500 MG Tabs Take 500 mg by mouth daily.   valACYclovir 500 MG tablet Commonly known as:  VALTREX Take 500 mg by mouth See admin instructions. Take 1 tablet (500 mg) by mouth twice daily for 3 days as needed for breakouts   Vitamin D3 10000 units Tabs Take 10,000 Units by mouth daily.   zolpidem 5 MG tablet Commonly known as:  AMBIEN Take 1 tablet (5 mg total) by mouth at bedtime as needed. for sleep What changed:  reasons to take this  additional instructions      Follow-up Information    [facility] Schedule an appointment as soon as possible for a visit in 2 week(s).   Specialty:  General Surgery Why:  for follow up Contact information: [address] [place] [address] [phone]       Margaree Mackintosh, MD. Schedule an appointment as soon as possible for a visit in 5 day(s).   Specialty:  Internal Medicine Why:  To be seen with repeat labs (CBC & CMP). Contact information: [address] [place] [address] [phone]          Allergies  Allergen Reactions  . Mushroom Extract Complex  Anaphylaxis and Hives  . Codeine Nausea Only  . Dextromethorphan Nausea And Vomiting  . Levaquin [Levofloxacin In D5w] Other (See Comments)    Myalgias  . Tape Itching    Please use paper tape  . Tessalon Perles Nausea And Vomiting      Procedures/Studies: Dg Cholangiogram Operative  Result Date: 09/22/2016 CLINICAL DATA:  Cholecystectomy for symptomatic cholelithiasis. EXAM: INTRAOPERATIVE CHOLANGIOGRAM TECHNIQUE: Cholangiographic images from the C-arm fluoroscopic device were submitted for interpretation post-operatively. Please see the procedural report for the amount of contrast and the fluoroscopy time utilized. COMPARISON:  Abdominal ultrasound, MRI and MRCP on 09/20/2016 FINDINGS: Intraoperative cholangiogram show this no evidence of biliary filling defects or obstruction. Caliber of the common bile duct is mildly prominent. Contrast enters the duodenum normally. Mild reflux of contrast is noted into the pancreatic duct. No contrast extravasation identified. IMPRESSION: No evidence of choledocholithiasis or biliary obstruction. Electronically Signed   By: Irish Lack M.D.   On: 09/22/2016 14:46   Mr Abdomen Wo Contrast  Result Date: 09/20/2016 CLINICAL DATA:  61 year old female inpatient with epigastric/right upper quadrant abdominal pain, elevated bilirubin, cholelithiasis and dilated common bile duct at sonography. EXAM: MRI ABDOMEN WITHOUT CONTRAST TECHNIQUE: Multiplanar multisequence MR imaging was performed without the administration of intravenous contrast. COMPARISON:  Abdominal sonogram from earlier today. FINDINGS: Motion degraded scan. Lower chest: Clear lung bases. Hepatobiliary: Normal liver size and configuration. No hepatic steatosis. No liver mass. Distended gallbladder contains several subcentimeter layering gallstones. No significant gallbladder wall thickening or pericholecystic fluid. Minimal central intrahepatic biliary ductal dilatation. Mildly dilated common bile  duct. Common bile duct diameter 8 mm. No evidence of choledocholithiasis on the motion degraded sequences. No biliary strictures. No appreciable biliary or ampullary mass. Pancreas: No pancreatic mass or duct dilation. No evidence of pancreas divisum. Spleen: Normal size. No mass. Adrenals/Urinary Tract: Normal right adrenal. Left adrenal 2.9 cm nodule is stable since 04/01/2016 and demonstrates prominent loss of signal intensity on out of phase chemical shift imaging, diagnostic of a benign adenoma. No hydronephrosis. Normal size kidneys. Simple appearing subcentimeter renal cyst in the posterior upper left kidney. No additional renal lesions. Stomach/Bowel: Grossly normal stomach. Visualized small and large bowel is normal caliber, with no bowel wall thickening. Vascular/Lymphatic: Normal caliber abdominal aorta. No pathologically enlarged lymph nodes in the abdomen. Other: No abdominal ascites or focal fluid collection. Musculoskeletal: No aggressive appearing focal osseous lesions. IMPRESSION: 1. Limited motion degraded scan. 2. Distended gallbladder with cholelithiasis. No specific MR findings of acute cholecystitis . 3. Minimal central intrahepatic biliary ductal dilatation. Mildly dilated common bile duct (8 mm diameter). No evidence of choledocholithiasis. 4. Stable left adrenal adenoma . Electronically Signed   By: Delbert Phenix M.D.   On: 09/20/2016 18:42   US Abdomen Complete  Result Date: 09/20/2016 CLINICAL DATA:  Abdominal pain since Friday. Elevated liver function studies. EXAM: ABDOMEN ULTRASOUND COMPLETE COMPARISON:  None. FINDINGS: Gallbladder: Numerous small layering gallstones in the gallbladder. The largest measures 6 mm. There is also some layering echogenic sludge. No gallbladder wall thickening, pericholecystic fluid or sonographic Murphy sign to suggest acute cholecystitis. Common bile duct: Diameter: 8 mm Liver: Normal echogenicity without focal lesion or biliary dilatation. IVC: Normal  caliber Pancreas: Sonographically normal Spleen: Normal size and echogenicity without focal lesions. Right Kidney: Length: 11.1 cm. Normal renal cortical thickness and echogenicity without focal lesions or hydronephrosis. Left Kidney: Length: 11.7 cm. Normal renal cortical thickness and echogenicity without focal lesions or hydronephrosis. Abdominal aorta: Normal caliber. Other findings: Left adrenal gland adenoma is again demonstrated. IMPRESSION: 1. Cholelithiasis without sonographic findings for acute cholecystitis. 2. Dilated common bile duct could be due to a distal common bile duct stone. ERCP or MRCP may be helpful for further evaluation, particularly given the patient's elevated liver function studies. 3. Stable left adrenal gland nodule. 4. The liver, spleen, pancreas and kidneys are unremarkable. Electronically Signed   By: Rudie Meyer M.D.   On: 09/20/2016 15:43   Mr 3d Recon At Scanner  Result Date: 09/20/2016 CLINICAL DATA:  61 year old female inpatient with epigastric/right upper quadrant abdominal pain, elevated bilirubin, cholelithiasis and dilated common bile duct at sonography. EXAM: MRI ABDOMEN WITHOUT CONTRAST TECHNIQUE: Multiplanar multisequence MR imaging was performed without the administration of intravenous contrast. COMPARISON:  Abdominal sonogram from earlier today. FINDINGS: Motion degraded scan. Lower chest: Clear lung bases. Hepatobiliary: Normal liver size and configuration. No hepatic steatosis. No liver mass. Distended gallbladder contains several subcentimeter layering gallstones. No significant gallbladder wall thickening or pericholecystic fluid. Minimal central intrahepatic biliary  ductal dilatation. Mildly dilated common bile duct. Common bile duct diameter 8 mm. No evidence of choledocholithiasis on the motion degraded sequences. No biliary strictures. No appreciable biliary or ampullary mass. Pancreas: No pancreatic mass or duct dilation. No evidence of pancreas divisum.  Spleen: Normal size. No mass. Adrenals/Urinary Tract: Normal right adrenal. Left adrenal 2.9 cm nodule is stable since 04/01/2016 and demonstrates prominent loss of signal intensity on out of phase chemical shift imaging, diagnostic of a benign adenoma. No hydronephrosis. Normal size kidneys. Simple appearing subcentimeter renal cyst in the posterior upper left kidney. No additional renal lesions. Stomach/Bowel: Grossly normal stomach. Visualized small and large bowel is normal caliber, with no bowel wall thickening. Vascular/Lymphatic: Normal caliber abdominal aorta. No pathologically enlarged lymph nodes in the abdomen. Other: No abdominal ascites or focal fluid collection. Musculoskeletal: No aggressive appearing focal osseous lesions. IMPRESSION: 1. Limited motion degraded scan. 2. Distended gallbladder with cholelithiasis. No specific MR findings of acute cholecystitis . 3. Minimal central intrahepatic biliary ductal dilatation. Mildly dilated common bile duct (8 mm diameter). No evidence of choledocholithiasis. 4. Stable left adrenal adenoma . Electronically Signed   By: Delbert Phenix M.D.   On: 09/20/2016 18:42      Subjective: Seen this morning. Mild/moderate appropriate op site pain. Passing flatus. No BM. Tolerating diet. Seen along with surgeon in room. As per RN, no acute issues.  Discharge Exam:  Vitals:   09/22/16 2244 09/23/16 0626 09/23/16 1054 09/23/16 1410  BP: (!) 112/52 (!) 135/58 (!) 111/53 (!) 103/51  Pulse: 73 75 74 76  Resp: 15 15 16 16   Temp: 99 F (37.2 C) 99.3 F (37.4 C) 98.5 F (36.9 C) 98.4 F (36.9 C)  TempSrc: Oral Oral Oral Oral  SpO2: 98% 95% 94% 97%  Weight:      Height:        General: Pt lying comfortably in bed & appears in no obvious distress. Cardiovascular: S1 & S2 heard, RRR, S1/S2 +. No murmurs, rubs, gallops or clicks. No JVD or pedal edema. Respiratory: Clear to auscultation without wheezing, rhonchi or crackles. No increased work of  breathing. Abdominal:  Lap site looked clean and dry without acute findings. Mild appropriate tenderness at lap site. No abdominal distention, rigidity, guarding or rebound. No organomegaly or masses appreciated. Normal bowel sounds heard. CNS: Alert and oriented. No focal deficits. Extremities: no edema, no cyanosis    The results of significant diagnostics from this hospitalization (including imaging, microbiology, ancillary and laboratory) are listed below for reference.     Microbiology: Recent Results (from the past 240 hour(s))  MRSA PCR Screening     Status: None   Collection Time: 09/21/16  8:40 AM  Result Value Ref Range Status   MRSA by PCR NEGATIVE NEGATIVE Final    Comment:        The GeneXpert MRSA Assay (FDA approved for NASAL specimens only), is one component of a comprehensive MRSA colonization surveillance program. It is not intended to diagnose MRSA infection nor to guide or monitor treatment for MRSA infections.      Labs:  Basic Metabolic Panel:  Recent Labs Lab 09/20/16 1345 09/21/16 0617 09/22/16 0827 09/23/16 0546  NA 139 141 139 139  K 4.6 3.7 3.8 4.2  CL 99* 106 102 98*  CO2 25 31 26 31   GLUCOSE 135* 102* 119* 131*  BUN 20 17 13 10   CREATININE 0.93 0.81 0.79 0.78  CALCIUM 10.1 9.1 9.3 9.0   Liver Function Tests:  Recent Labs Lab 09/20/16 1345 09/21/16 0617 09/23/16 0546  AST 547* 341* 85*  ALT 644* 571* 294*  ALKPHOS 147* 135* 128*  BILITOT 2.8* 3.8* 1.7*  PROT 7.9 6.0* 6.0*  ALBUMIN 4.5 3.4* 3.2*    Recent Labs Lab 09/20/16 1345  LIPASE 29   CBC:  Recent Labs Lab 09/20/16 1345 09/21/16 0617 09/22/16 0827 09/23/16 0546  WBC 9.9 5.4 6.9 9.1  NEUTROABS  --   --  4.5 6.5  HGB 13.3 11.0* 11.9* 10.6*  HCT 39.9 34.3* 36.1 33.1*  MCV 81.9 83.7 83.4 84.2  PLT 270 178 176 172   Urinalysis    Component Value Date/Time   COLORURINE AMBER (A) 09/20/2016 1413   APPEARANCEUR CLEAR 09/20/2016 1413   LABSPEC 1.023  09/20/2016 1413   PHURINE 8.0 09/20/2016 1413   GLUCOSEU NEGATIVE 09/20/2016 1413   HGBUR NEGATIVE 09/20/2016 1413   BILIRUBINUR NEGATIVE 09/20/2016 1413   BILIRUBINUR neg 10/11/2015 1017   KETONESUR 20 (A) 09/20/2016 1413   PROTEINUR 30 (A) 09/20/2016 1413   UROBILINOGEN >=8.0 09/20/2016 1238   NITRITE NEGATIVE 09/20/2016 1413   LEUKOCYTESUR NEGATIVE 09/20/2016 1413    Discussed in detail with patient's spouse at bedside. Updated care and answered questions.  Time coordinating discharge: Over 30 minutes  SIGNED:  Marcellus Scott, MD, FACP, FHM. [facility] Pager [phone]  If 7PM-7AM, please contact night-coverage [email or web address] Password [email or web address] 09/23/2016, 2:20 PM

## 2016-09-23 NOTE — Progress Notes (Signed)
[facility] Progress Note  1 Day Post-Op  Subjective: Pain worse with deep breathing. No nausea, vomiting, fever or chills overnight. Tolerating diet.   Objective: Vital signs in last 24 hours: Temp:  [97.5 F (36.4 C)-99.3 F (37.4 C)] 99.3 F (37.4 C) (02/07 0626) Pulse Rate:  [73-83] 75 (02/07 0626) Resp:  [11-16] 15 (02/07 0626) BP: (112-151)/(52-85) 135/58 (02/07 0626) SpO2:  [95 %-100 %] 95 % (02/07 0626) Last BM Date: 09/20/16  Intake/Output from previous day: 02/06 0701 - 02/07 0700 In: 3608.3 [P.O.:360; I.V.:3198.3; IV Piggyback:50] Out: 1050 [Urine:1050] Intake/Output this shift: No intake/output data recorded.  PE: Gen:  Alert, NAD, pleasant, cooperative, well appearing Card:  RRR, no M/G/R heard Pulm:  Rate and effort normal Abd: Soft, nondistended, mild TTP worse in the RUQ, +BS, incisions without surrounding erythema or discharge.  Skin: no rashes noted, warm and dry  Lab Results:   Recent Labs  09/22/16 0827 09/23/16 0546  WBC 6.9 9.1  HGB 11.9* 10.6*  HCT 36.1 33.1*  PLT 176 172   BMET  Recent Labs  09/22/16 0827 09/23/16 0546  NA 139 139  K 3.8 4.2  CL 102 98*  CO2 26 31  GLUCOSE 119* 131*  BUN 13 10  CREATININE 0.79 0.78  CALCIUM 9.3 9.0   PT/INR No results for input(s): LABPROT, INR in the last 72 hours. CMP     Component Value Date/Time   NA 139 09/23/2016 0546   K 4.2 09/23/2016 0546   CL 98 (L) 09/23/2016 0546   CO2 31 09/23/2016 0546   GLUCOSE 131 (H) 09/23/2016 0546   BUN 10 09/23/2016 0546   CREATININE 0.78 09/23/2016 0546   CREATININE 0.66 10/07/2015 0912   CALCIUM 9.0 09/23/2016 0546   PROT 6.0 (L) 09/21/2016 0617   ALBUMIN 3.4 (L) 09/21/2016 0617   AST 341 (H) 09/21/2016 0617   ALT 571 (H) 09/21/2016 0617   ALKPHOS 135 (H) 09/21/2016 0617   BILITOT 3.8 (H) 09/21/2016 0617   GFRNONAA >60 09/23/2016 0546   GFRNONAA >89 10/07/2015 0912   GFRAA >60 09/23/2016 0546   GFRAA >89 10/07/2015 0912    Lipase     Component Value Date/Time   LIPASE 29 09/20/2016 1345       Studies/Results: Dg Cholangiogram Operative  Result Date: 09/22/2016 CLINICAL DATA:  Cholecystectomy for symptomatic cholelithiasis. EXAM: INTRAOPERATIVE CHOLANGIOGRAM TECHNIQUE: Cholangiographic images from the C-arm fluoroscopic device were submitted for interpretation post-operatively. Please see the procedural report for the amount of contrast and the fluoroscopy time utilized. COMPARISON:  Abdominal ultrasound, MRI and MRCP on 09/20/2016 FINDINGS: Intraoperative cholangiogram show this no evidence of biliary filling defects or obstruction. Caliber of the common bile duct is mildly prominent. Contrast enters the duodenum normally. Mild reflux of contrast is noted into the pancreatic duct. No contrast extravasation identified. IMPRESSION: No evidence of choledocholithiasis or biliary obstruction. Electronically Signed   By: Aletta Edouard M.D.   On: 09/22/2016 14:46    Anti-infectives: Anti-infectives    Start     Dose/Rate Route Frequency Ordered Stop   09/22/16 1031  cefoTEtan in Dextrose 5% (CEFOTAN) 2-2.08 GM-% IVPB    Comments:  Maryjean Ka   : cabinet override      09/22/16 1031 09/22/16 2244   09/21/16 1400  cefoTEtan (CEFOTAN) 2 g in dextrose 5 % 50 mL IVPB     2 g 100 mL/hr over 30 Minutes Intravenous To [facility] 09/21/16 0756 09/22/16 1050  Assessment/Plan  HTN Asthma  symptomatic cholelithiasis and possible choledocholithiasis S/P laparoscopic cholecystectomy with IOC, Dr. Brantley Stage, 09/22/16   - IOC showed patent ducts - LFT's and Tbili trending down - no leukocytosis, afebrile  Pt is clear for discharge from a surgical standpoint.  Follow up information provided in discharge summary. No PO antibiotics indicated.    LOS: 3 days    Kalman Drape , [facility] 2 Surgery 09/23/2016, 8:20 AM Pager: [phone] Consults: [phone] Mon-Fri 7:00 am-4:30  pm Sat-Sun 7:00 am-11:30 am

## 2016-09-23 NOTE — Progress Notes (Signed)
Lenard Simmer to be D/C'd  per MD order. Discussed with the patient and all questions fully answered.  VSS, Skin clean, dry and intact without evidence of skin break down, no evidence of skin tears noted.  IV catheter discontinued intact. Site without signs and symptoms of complications. Dressing and pressure applied.  An After Visit Summary was printed and given to the patient. Patient received prescription.  D/c education completed with patient/family including follow up instructions, medication list, d/c activities limitations if indicated, with other d/c instructions as indicated by MD - patient able to verbalize understanding, all questions fully answered.   Patient instructed to return to ED, call 911, or call MD for any changes in condition.   Patient to be escorted via [place], and D/C home via private auto.

## 2016-09-23 NOTE — Discharge Instructions (Signed)
Please arrive at least 30 min before your appointment to complete your check in paperwork.  If you are unable to arrive 30 min prior to your appointment time we may have to cancel or reschedule you.  LAPAROSCOPIC SURGERY: POST OP INSTRUCTIONS  1. DIET: Follow a light bland diet the first 24 hours after arrival home, such as soup, liquids, crackers, etc. Be sure to include lots of fluids daily. Avoid fast food or heavy meals as your are more likely to get nauseated. Eat a low fat the next few days after surgery.  2. Take your usually prescribed home medications unless otherwise directed. 3. PAIN CONTROL:  1. Pain is best controlled by a usual combination of three different methods TOGETHER:  1. Ice/Heat 2. Over the counter pain medication 3. Prescription pain medication 2. Most patients will experience some swelling and bruising around the incisions. Ice packs or heating pads (30-60 minutes up to 6 times a day) will help. Use ice for the first few days to help decrease swelling and bruising, then switch to heat to help relax tight/sore spots and speed recovery. Some people prefer to use ice alone, heat alone, alternating between ice & heat. Experiment to what works for you. Swelling and bruising can take several weeks to resolve.  3. It is helpful to take an over-the-counter pain medication regularly for the first few weeks. Choose one of the following that works best for you:  1. Naproxen (Aleve, etc) Two 220mg  tabs twice a day 2. Ibuprofen (Advil, etc) Three 200mg  tabs four times a day (every meal & bedtime) 3. Acetaminophen (Tylenol, etc) 500-650mg  four times a day (every meal & bedtime) 4. A prescription for pain medication (such as oxycodone, hydrocodone, etc) should be given to you upon discharge. Take your pain medication as prescribed.  1. If you are having problems/concerns with the prescription medicine (does not control pain, nausea, vomiting, rash, itching, etc), please call us [phone]  [phone] to see if we need to switch you to a different pain medicine that will work better for you and/or control your side effect better. 2. If you need a refill on your pain medication, please contact your pharmacy. They will contact our office to request authorization. Prescriptions will not be filled after 5 pm or on week-ends. 4. Avoid getting constipated. Between the surgery and the pain medications, it is common to experience some constipation. Increasing fluid intake and taking a fiber supplement (such as Metamucil, Citrucel, FiberCon, MiraLax, etc) 1-2 times a day regularly and a stool softener daily will usually help prevent this problem from occurring. A mild laxative (prune juice, Milk of Magnesia, MiraLax, etc) should be taken according to package directions if there are no bowel movements after 48 hours.  5. Watch out for diarrhea. If you have many loose bowel movements, simplify your diet to bland foods & liquids for a few days. Stop any stool softeners and decrease your fiber supplement. Switching to mild anti-diarrheal medications (Kayopectate, Pepto Bismol) can help. If this worsens or does not improve, please call us. 6. Wash / shower every day. You may shower over the dressings as they are waterproof. Continue to shower over incision(s) after the dressing is off. 7. Remove your waterproof bandages 5 days after surgery. You may leave the incision open to air. You may replace a dressing/Band-Aid to cover the incision for comfort if you wish.  8. ACTIVITIES as tolerated:  1. You may resume regular (light) daily activities beginning the next day--such as  daily self-care, walking, climbing stairs--gradually increasing activities as tolerated. If you can walk 30 minutes without difficulty, it is safe to try more intense activity such as jogging, treadmill, bicycling, low-impact aerobics, swimming, etc. 2. Save the most intensive and strenuous activity for last such as sit-ups, heavy lifting,  contact sports, etc Refrain from any heavy lifting or straining until you are off narcotics for pain control.  3. DO NOT PUSH THROUGH PAIN. Let pain be your guide: If it hurts to do something, don't do it. Pain is your body warning you to avoid that activity for another week until the pain goes down. 4. You may drive when you are no longer taking prescription pain medication, you can comfortably wear a seatbelt, and you can safely maneuver your car and apply brakes. 5. You may have sexual intercourse when it is comfortable.  9. FOLLOW UP in our office  1. Please call [facility] at [phone] to set up an appointment to see your surgeon in the office for a follow-up appointment approximately 2-3 weeks after your surgery. 2. Make sure that you call for this appointment the day you arrive home to insure a convenient appointment time.      10. IF YOU HAVE DISABILITY OR FAMILY LEAVE FORMS, BRING THEM TO THE               OFFICE FOR PROCESSING.   WHEN TO CALL us [phone]:  1. Poor pain control 2. Reactions / problems with new medications (rash/itching, nausea, etc)  3. Fever over 101.5 F (38.5 C) 4. Inability to urinate 5. Nausea and/or vomiting 6. Worsening swelling or bruising 7. Continued bleeding from incision. 8. Increased pain, redness, or drainage from the incision  The clinic staff is available to answer your questions during regular business hours (8:30am-5pm). Please dont hesitate to call and ask to speak to one of our nurses for clinical concerns.  If you have a medical emergency, go to the nearest emergency room or call 911.  A surgeon from [facility] is always on call at the [facility], [address], [place], [place], [place] [address] ?  MAIN: [phone] ? TOLL FREE: [phone] ?  FAX [phone]  [email or web address]   Pain medication instructions:  How can pain medicine affect me?  You were given  a prescription for pain medicine. This medicine may make you tired or drowsy and may affect your ability to think clearly. Pain medicine may also affect your ability to drive or perform certain physical activities. It may not be possible to make all of your pain go away, but you should be comfortable enough to move, breathe, and take care of yourself. How often should I take pain medicine and how much should I take?  Take pain medicine only as directed by your health care provider and only as needed for pain.  You do not need to take pain medicine if you are not having pain, unless directed by your health care provider.  You can take less than the prescribed dose if you find that a smaller amount of medicine controls your pain. What restrictions do I have while taking pain medicine? Follow these instructions after you start taking pain medicine, while you are taking the medicine, and for 8 hours after you stop taking the medicine:  Do not drive.  Do not operate machinery.  Do not operate power tools.  Do not sign legal documents.  Do  not drink alcohol.  Do not take sleeping pills.  Do not supervise children by yourself.  Do not participate in activities that require climbing or being in high places.  Do not enter a body of water--such as a lake, river, ocean, spa, or swimming pool--without an adult nearby who can monitor and help you. How can I keep others safe while I am taking pain medicine?  Store your pain medicine as directed by your health care provider. Make sure that it is placed where children and pets cannot reach it.  Never share your pain medicine with anyone.  Do not save any leftover pills. If you have any leftover pain medicine, get rid of it or destroy it as directed by your health care provider. What else do I need to know about taking pain medicine?  Use a stool softener if you become constipated from your pain medicine. Increasing your intake of fruits and  vegetables will also help with constipation.  Write down the times when you take your pain medicine. Look at the times before you take your next dose of medicine. It is easy to become confused while on pain medicine. Recording the times helps you to avoid an overdose.  If your pain is severe, do not try to treat it yourself by taking more pills than instructed on your prescription. Contact your health care provider for help.  You may have been prescribed a pain medicine that contains acetaminophen. Do not take any other acetaminophen while taking this medicine. An overdose of acetaminophen can result in severe liver damage. Acetaminophen is found in many over-the-counter (OTC) and prescription medicines. If you are taking any medicines in addition to your pain medicine, check the active ingredients on those medicines to see if acetaminophen is listed. When should I call my health care provider?  Your medicine is not helping to make the pain go away.  You vomit or have diarrhea shortly after taking the medicine.  You develop new pain in areas that did not hurt before.  You have an allergic reaction to your medicine. This may include: ? Itchiness. ? Swelling. ? Dizziness. ? Developing a new rash. When should I call 911 or go to the emergency room?  You feel dizzy or you faint.  You are very confused or disoriented.  You repeatedly vomit.  Your skin or lips turn pale or bluish in color.  You have shortness of breath or you are breathing much more slowly than usual.  You have a severe allergic reaction to your medicine. This includes: ? Developing tongue swelling. ? Having difficulty breathing. This information is not intended to replace advice given to you by your health care provider. Make sure you discuss any questions you have with your health care provider. Document Released: 11/09/2000 Document Revised: 02/21/2016 Document Reviewed: 06/07/2014 Elsevier Interactive Patient  Education  2017 [place].  Additional discharge instructions:  Please get your medications reviewed and adjusted by your Primary MD.  Please request your Primary MD to go over all Hospital Tests and Procedure/Radiological results at the follow up, please get all Hospital records sent to your Prim MD by signing hospital release before you go home.  If you had Pneumonia of Lung problems at the Hospital: Please get a 2 view Chest X ray done in 6-8 weeks after hospital discharge or sooner if instructed by your Primary MD.  If you have Congestive Heart Failure: Please call your Cardiologist or Primary MD anytime you have any of the following  symptoms:  1) 3 pound weight gain in 24 hours or 5 pounds in 1 week  2) shortness of breath, with or without a dry hacking cough  3) swelling in the hands, feet or stomach  4) if you have to sleep on extra pillows at night in order to breathe  Follow cardiac low salt diet and 1.5 lit/day fluid restriction.  If you have diabetes Accuchecks 4 times/day, Once in AM empty stomach and then before each meal. Log in all results and show them to your primary doctor at your next visit. If any glucose reading is under 80 or above 300 call your primary MD immediately.  If you have Seizure/Convulsions/Epilepsy: Please do not drive, operate heavy machinery, participate in activities at heights or participate in high speed sports until you have seen by Primary MD or a Neurologist and advised to do so again.  If you had Gastrointestinal Bleeding: Please ask your Primary MD to check a complete blood count within one week of discharge or at your next visit. Your endoscopic/colonoscopic biopsies that are pending at the time of discharge, will also need to followed by your Primary MD.  Get Medicines reviewed and adjusted. Please take all your medications with you for your next visit with your Primary MD  Please request your Primary MD to go over all hospital  tests and procedure/radiological results at the follow up, please ask your Primary MD to get all Hospital records sent to his/her office.  If you experience worsening of your admission symptoms, develop shortness of breath, life threatening emergency, suicidal or homicidal thoughts you must seek medical attention immediately by calling 911 or calling your MD immediately  if symptoms less severe.  You must read complete instructions/literature along with all the possible adverse reactions/side effects for all the Medicines you take and that have been prescribed to you. Take any new Medicines after you have completely understood and accpet all the possible adverse reactions/side effects.   Do not drive or operate heavy machinery when taking Pain medications.   Do not take more than prescribed Pain, Sleep and Anxiety Medications  Special Instructions: If you have smoked or chewed Tobacco  in the last 2 yrs please stop smoking, stop any regular Alcohol  and or any Recreational drug use.  Wear Seat belts while driving.  Please note You were cared for by a hospitalist during your hospital stay. If you have any questions about your discharge medications or the care you received while you were in the hospital after you are discharged, you can call the unit and asked to speak with the hospitalist on call if the hospitalist that took care of you is not available. Once you are discharged, your primary care physician will handle any further medical issues. Please note that NO REFILLS for any discharge medications will be authorized once you are discharged, as it is imperative that you return to your primary care physician (or establish a relationship with a primary care physician if you do not have one) for your aftercare needs so that they can reassess your need for medications and monitor your lab values.  You can reach the hospitalist office at phone [phone] or fax [phone]   If you do not have a  primary care physician, you can call [phone] for a physician referral.

## 2016-09-25 ENCOUNTER — Ambulatory Visit: Payer: BLUE CROSS/BLUE SHIELD | Admitting: Internal Medicine

## 2016-09-28 ENCOUNTER — Ambulatory Visit (INDEPENDENT_AMBULATORY_CARE_PROVIDER_SITE_OTHER): Payer: BLUE CROSS/BLUE SHIELD | Admitting: Internal Medicine

## 2016-09-28 ENCOUNTER — Encounter: Payer: Self-pay | Admitting: Internal Medicine

## 2016-09-28 VITALS — BP 108/80 | HR 74 | Temp 99.5°F | Wt 242.0 lb

## 2016-09-28 DIAGNOSIS — D649 Anemia, unspecified: Secondary | ICD-10-CM

## 2016-09-28 DIAGNOSIS — Z8659 Personal history of other mental and behavioral disorders: Secondary | ICD-10-CM

## 2016-09-28 DIAGNOSIS — Z9049 Acquired absence of other specified parts of digestive tract: Secondary | ICD-10-CM

## 2016-09-28 DIAGNOSIS — R748 Abnormal levels of other serum enzymes: Secondary | ICD-10-CM

## 2016-09-28 DIAGNOSIS — Z8709 Personal history of other diseases of the respiratory system: Secondary | ICD-10-CM | POA: Diagnosis not present

## 2016-09-28 DIAGNOSIS — J069 Acute upper respiratory infection, unspecified: Secondary | ICD-10-CM | POA: Diagnosis not present

## 2016-09-28 LAB — CBC WITH DIFFERENTIAL/PLATELET
BASOS PCT: 1 %
Basophils Absolute: 70 cells/uL (ref 0–200)
EOS ABS: 280 {cells}/uL (ref 15–500)
Eosinophils Relative: 4 %
HEMATOCRIT: 41.7 % (ref 35.0–45.0)
HEMOGLOBIN: 13.5 g/dL (ref 11.7–15.5)
LYMPHS ABS: 1890 {cells}/uL (ref 850–3900)
LYMPHS PCT: 27 %
MCH: 30 pg (ref 27.0–33.0)
MCHC: 32.4 g/dL (ref 32.0–36.0)
MCV: 92.7 fL (ref 80.0–100.0)
MONO ABS: 420 {cells}/uL (ref 200–950)
MPV: 10.2 fL (ref 7.5–12.5)
Monocytes Relative: 6 %
Neutro Abs: 4340 cells/uL (ref 1500–7800)
Neutrophils Relative %: 62 %
Platelets: 274 10*3/uL (ref 140–400)
RBC: 4.5 MIL/uL (ref 3.80–5.10)
RDW: 12.7 % (ref 11.0–15.0)
WBC: 7 10*3/uL (ref 3.8–10.8)

## 2016-09-28 LAB — HEPATIC FUNCTION PANEL
ALK PHOS: 101 U/L (ref 33–130)
ALT: 64 U/L — AB (ref 6–29)
AST: 20 U/L (ref 10–35)
Albumin: 4.1 g/dL (ref 3.6–5.1)
BILIRUBIN INDIRECT: 0.4 mg/dL (ref 0.2–1.2)
Bilirubin, Direct: 0.3 mg/dL — ABNORMAL HIGH (ref <=0.2)
TOTAL PROTEIN: 7.2 g/dL (ref 6.1–8.1)
Total Bilirubin: 0.7 mg/dL (ref 0.2–1.2)

## 2016-09-28 MED ORDER — CLARITHROMYCIN 500 MG PO TABS: 500.0000 mg | ORAL_TABLET | Freq: Two times a day (BID) | ORAL | 0 refills | Status: DC

## 2016-09-28 MED ORDER — BUPROPION HCL ER (XL) 150 MG PO TB24: 150.0000 mg | ORAL_TABLET | Freq: Every day | ORAL | 1 refills | Status: DC

## 2016-09-28 NOTE — Patient Instructions (Addendum)
Biaxin 500 mg twice daily for 10 days for acute URI with history of asthma. Take pain medication sparingly. Advance diet slowly. Repeat CBC and liver functions drawn today. Have started Wellbutrin for depression. She is off Contrave. Follow-up with surgeon February 28.

## 2016-09-28 NOTE — Progress Notes (Addendum)
Subjective:    Patient ID: Stephanie Andrade, female    DOB: [birth year], 61 y.o.   MRN: [id number]  HPI 61 year old Female status post laparoscopic cholecystectomy after presenting to the emergency department 09/20/2016 with epigastric and right upper quadrant abdominal pain. MR without contrast showed distended gallbladder with cholelithiasis. No evidence of choledocholithiasis.There is a stable left adrenal nodule at 2.9 cm unchanged from 04/01/2016 and consistent with benign adenoma. She underwent laparoscopic cholecystectomy under general anesthesia to 618. She has postoperative anemia and elevated liver functions which need follow-up.CBC and liver functions drawn today.  Has developed acute URI. Husband has similar illness. She has a history of asthma. Has inhalers on hand. Has been taking pain medication sparingly for abdominal pain.  She used to take Contrave for weight loss. That was discontinued. She wants to restart Wellbutrin. Have prescribed 150 mg XL daily. Had not lost any significant amount of weight with Contraveso I do not think weight loss  triggered her gallbladder attack.  On February 7, she had a hemoglobin of 10.6 grams was 13.3 g on admission.on February 7 SGPT was 294 and previously had been 571. Alkaline phosphatase was 128 previously had been 135. SGOT was 85 and previously had been 341.    Review of Systems--has been sneezing. No fever or shaking chills. No sore throat. No wheezing. Has history of asthma.     Objective:   Physical Exam Skin: warm and dry. Nodes none. TMs and pharynx are clear. Neck is supple. Chest clear to auscultation without rales or wheezing. Cardiac exam regular rate and rhythm. Abdomen is soft nondistended but mildly tender in the right upper quadrant without rebound tenderness. Well-healed laparoscopic incisions noted       Assessment & Plan:  Status post laparoscopic cholecystectomy for acute cholecystitis  Postoperative  anemia  Elevated liver functions  Obesity  History of depression  Acute URI  History of asthma  Plan: Begin Wellbutrin XL 150 mg daily. Lab work drawn and pending. Begin Biaxin 500 mg twice daily for 10 days for acute respiratory infection. She is at risk for bronchitis. Take pain medications sparingly. Follow-up was surgeon for cholecystectomy postop care as directed. Advance diet slowly. Discontinue Contrave.

## 2016-09-30 ENCOUNTER — Other Ambulatory Visit: Payer: Self-pay | Admitting: Internal Medicine

## 2016-10-08 ENCOUNTER — Other Ambulatory Visit: Payer: BLUE CROSS/BLUE SHIELD | Admitting: Internal Medicine

## 2016-10-08 DIAGNOSIS — Z1329 Encounter for screening for other suspected endocrine disorder: Secondary | ICD-10-CM

## 2016-10-08 DIAGNOSIS — Z0000 Encounter for general adult medical examination without abnormal findings: Secondary | ICD-10-CM | POA: Diagnosis not present

## 2016-10-08 DIAGNOSIS — Z13220 Encounter for screening for lipoid disorders: Secondary | ICD-10-CM

## 2016-10-08 DIAGNOSIS — I10 Essential (primary) hypertension: Secondary | ICD-10-CM | POA: Diagnosis not present

## 2016-10-08 DIAGNOSIS — R7302 Impaired glucose tolerance (oral): Secondary | ICD-10-CM | POA: Diagnosis not present

## 2016-10-08 DIAGNOSIS — Z1321 Encounter for screening for nutritional disorder: Secondary | ICD-10-CM

## 2016-10-08 LAB — CBC WITH DIFFERENTIAL/PLATELET
BASOS PCT: 1 %
Basophils Absolute: 55 cells/uL (ref 0–200)
EOS ABS: 330 {cells}/uL (ref 15–500)
Eosinophils Relative: 6 %
HEMATOCRIT: 40.8 % (ref 35.0–45.0)
Hemoglobin: 13.3 g/dL (ref 11.7–15.5)
LYMPHS PCT: 35 %
Lymphs Abs: 1925 cells/uL (ref 850–3900)
MCH: 26.8 pg — ABNORMAL LOW (ref 27.0–33.0)
MCHC: 32.6 g/dL (ref 32.0–36.0)
MCV: 82.3 fL (ref 80.0–100.0)
MONO ABS: 330 {cells}/uL (ref 200–950)
MPV: 10.1 fL (ref 7.5–12.5)
Monocytes Relative: 6 %
NEUTROS ABS: 2860 {cells}/uL (ref 1500–7800)
Neutrophils Relative %: 52 %
Platelets: 301 10*3/uL (ref 140–400)
RBC: 4.96 MIL/uL (ref 3.80–5.10)
RDW: 14 % (ref 11.0–15.0)
WBC: 5.5 10*3/uL (ref 3.8–10.8)

## 2016-10-08 LAB — LIPID PANEL
CHOL/HDL RATIO: 2.7 ratio (ref <5.0)
CHOLESTEROL: 145 mg/dL (ref <200)
HDL: 54 mg/dL (ref >50)
LDL Cholesterol: 71 mg/dL (ref <100)
Triglycerides: 102 mg/dL (ref <150)
VLDL: 20 mg/dL (ref <30)

## 2016-10-08 LAB — COMPREHENSIVE METABOLIC PANEL
ALBUMIN: 4.4 g/dL (ref 3.6–5.1)
ALK PHOS: 86 U/L (ref 33–130)
ALT: 62 U/L — AB (ref 6–29)
AST: 42 U/L — ABNORMAL HIGH (ref 10–35)
BUN: 19 mg/dL (ref 7–25)
CALCIUM: 9.8 mg/dL (ref 8.6–10.4)
CO2: 26 mmol/L (ref 20–31)
Chloride: 102 mmol/L (ref 98–110)
Creat: 0.85 mg/dL (ref 0.50–1.05)
Glucose, Bld: 90 mg/dL (ref 65–99)
POTASSIUM: 5 mmol/L (ref 3.5–5.3)
Sodium: 138 mmol/L (ref 135–146)
TOTAL PROTEIN: 7.5 g/dL (ref 6.1–8.1)
Total Bilirubin: 0.8 mg/dL (ref 0.2–1.2)

## 2016-10-08 LAB — TSH: TSH: 0.58 mIU/L

## 2016-10-09 ENCOUNTER — Other Ambulatory Visit: Payer: BLUE CROSS/BLUE SHIELD | Admitting: Internal Medicine

## 2016-10-09 LAB — HEMOGLOBIN A1C
Hgb A1c MFr Bld: 5.2 % (ref <5.7)
Mean Plasma Glucose: 103 mg/dL

## 2016-10-09 LAB — MICROALBUMIN / CREATININE URINE RATIO
CREATININE, URINE: 110 mg/dL (ref 20–320)
MICROALB UR: 4.1 mg/dL
Microalb Creat Ratio: 37 mcg/mg creat — ABNORMAL HIGH (ref <30)

## 2016-10-09 LAB — VITAMIN D 25 HYDROXY (VIT D DEFICIENCY, FRACTURES): Vit D, 25-Hydroxy: 84 ng/mL (ref 30–100)

## 2016-10-12 ENCOUNTER — Ambulatory Visit (INDEPENDENT_AMBULATORY_CARE_PROVIDER_SITE_OTHER): Payer: BLUE CROSS/BLUE SHIELD | Admitting: Internal Medicine

## 2016-10-12 VITALS — BP 100/68 | HR 72 | Temp 99.3°F | Ht 66.25 in | Wt 241.3 lb

## 2016-10-12 DIAGNOSIS — Z0000 Encounter for general adult medical examination without abnormal findings: Secondary | ICD-10-CM

## 2016-10-12 DIAGNOSIS — M7918 Myalgia, other site: Secondary | ICD-10-CM

## 2016-10-12 DIAGNOSIS — Z9049 Acquired absence of other specified parts of digestive tract: Secondary | ICD-10-CM

## 2016-10-12 DIAGNOSIS — G4709 Other insomnia: Secondary | ICD-10-CM

## 2016-10-12 DIAGNOSIS — R7302 Impaired glucose tolerance (oral): Secondary | ICD-10-CM

## 2016-10-12 DIAGNOSIS — Z8709 Personal history of other diseases of the respiratory system: Secondary | ICD-10-CM

## 2016-10-12 DIAGNOSIS — K915 Postcholecystectomy syndrome: Secondary | ICD-10-CM | POA: Diagnosis not present

## 2016-10-12 DIAGNOSIS — I10 Essential (primary) hypertension: Secondary | ICD-10-CM | POA: Diagnosis not present

## 2016-10-12 DIAGNOSIS — R748 Abnormal levels of other serum enzymes: Secondary | ICD-10-CM

## 2016-10-12 DIAGNOSIS — G8929 Other chronic pain: Secondary | ICD-10-CM

## 2016-10-12 DIAGNOSIS — M791 Myalgia: Secondary | ICD-10-CM

## 2016-10-12 DIAGNOSIS — Z8659 Personal history of other mental and behavioral disorders: Secondary | ICD-10-CM | POA: Diagnosis not present

## 2016-10-12 LAB — POCT URINALYSIS DIPSTICK
BILIRUBIN UA: NEGATIVE
Glucose, UA: NEGATIVE
KETONES UA: NEGATIVE
LEUKOCYTES UA: NEGATIVE
Nitrite, UA: NEGATIVE
PH UA: 6.5
Protein, UA: NEGATIVE
SPEC GRAV UA: 1.02
Urobilinogen, UA: NEGATIVE

## 2016-10-12 NOTE — Progress Notes (Signed)
Subjective:    Patient ID: Stephanie Andrade, female    DOB: Mar 05, 1956, 61 y.o.   MRN: [id number]  HPI  61  Year old Female for health maintenance and evaluation of medical issues. Had recent cholecystectomy on Feb 6th.Has follow-up with surgeon this week. Still having some issues with postcholecystectomy diarrhea. Is trying to advance diet slowly.  Had right hip replacement August 2017.   Doing water aerobics and exercising some. Trying to watch her diet and eat low-calorie foods.  Has lost 24 pounds since February 2017.  Took Contrave for weight loss for a for a while but is no longer on that medication. Patient says she's been told she is not a candidate for gastric bypass surgery because her weight is not to the excess that she would be considered a candidate.  History of depression treated with Wellbutrin. Contrast-containing Wellbutrin.  History of asthma, vitamin D deficiency, musculoskeletal pain and back hip knee.  GYN has her on estrogen replacement.  Had TD vaccine February 2011.  Left heel fracture 2002, bilateral carpal tunnel syndrome diagnosed 1997.  Has been on Benicar HCTZ since 2008 for hypertension.  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 Codeine causes vomiting. Cannot tolerate hydrocodone, Tessalon Perles or dextromethorphan.  A colonoscopy done by Dr. Matthias Hughs in 2009.  Social history: She is married. No children. Four-year college degree. 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 Social alcohol consumption. Does not smoke.  Family history: Brother died with history of hypertension and alcoholism. Father died of complications of dementia. Mother died at age 70 of ovarian cancer. One sister in good health. Older brother living with hypertension and hyperlipidemia. Father had hyperlipidemia.   Review of Systems  Constitutional:       Decreased appetite after surgery  HENT: Negative.   Eyes: Negative.   Respiratory: Negative.     Cardiovascular: Negative.   Gastrointestinal:       Diarrhea status post cholecystectomy  Genitourinary: Negative.  Frequency:    Musculoskeletal: Positive for arthralgias and back pain.  Neurological: Negative.   Psychiatric/Behavioral: Positive for dysphoric mood.   left hip pain intermittent with standing.     Objective:   Physical Exam  Constitutional: She appears well-developed and well-nourished. No distress.  Pulmonary/Chest:  Breasts  Skin: She is not diaphoretic.  Vitals reviewed.         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 If does not resolve in the next 2-4 weeks May need Questran.  Depression  Obesity-continue with water aerobics diet and exercise efforts.  S/p right hip replacement  Hypertension-stable  Impaired glucose tolerance-hemoglobin A1c 5.2%. In February 2017 hemoglobin A1c 5.9%.  History of sarcoidosis diagnosed in 1995-currently asymptomatic  History of asthma  History of vitamin D deficiency  Elevated liver function test status post cholecystectomy. Could be fatty liver. Plan to repeat in 4 weeks. SGOT is 42 and SGPT is 62. 2 weeks ago SGOT was 20 and SGPT 64. In April 2017, SGOT was 26 and SGPT was 32. In February 2017. SGOT was 32 and SGPT was 56.  Prior to surgery SGOT was 341 and SGPT was 574  We need to monitor liver functions. Continue same antihypertensive medications. Patient will let us know if diarrhea does not resolve in the next 2-4 weeks. Continue to advance diet slowly. Follow-up with surgeon this week.

## 2016-10-13 ENCOUNTER — Encounter: Payer: Self-pay | Admitting: Internal Medicine

## 2016-10-13 NOTE — Patient Instructions (Signed)
Continue to advance diet slowly. Let us know if diarrhea does not resolve in the next 2-4 weeks. See surgeon in follow-up later this week. Follow-up with elevated liver functions in about 4 weeks. Continue diet and exercise regimen. Continue same medications.

## 2016-10-27 ENCOUNTER — Other Ambulatory Visit: Payer: BLUE CROSS/BLUE SHIELD | Admitting: Internal Medicine

## 2016-11-10 ENCOUNTER — Other Ambulatory Visit: Payer: BLUE CROSS/BLUE SHIELD | Admitting: Internal Medicine

## 2016-11-10 DIAGNOSIS — R7989 Other specified abnormal findings of blood chemistry: Secondary | ICD-10-CM | POA: Diagnosis not present

## 2016-11-10 DIAGNOSIS — R945 Abnormal results of liver function studies: Secondary | ICD-10-CM

## 2016-11-10 LAB — HEPATIC FUNCTION PANEL
ALBUMIN: 4 g/dL (ref 3.6–5.1)
ALK PHOS: 74 U/L (ref 33–130)
ALT: 13 U/L (ref 6–29)
AST: 14 U/L (ref 10–35)
Bilirubin, Direct: 0.1 mg/dL (ref <=0.2)
Indirect Bilirubin: 0.4 mg/dL (ref 0.2–1.2)
Total Bilirubin: 0.5 mg/dL (ref 0.2–1.2)
Total Protein: 6.8 g/dL (ref 6.1–8.1)

## 2016-11-30 ENCOUNTER — Telehealth: Payer: Self-pay | Admitting: Internal Medicine

## 2016-11-30 ENCOUNTER — Ambulatory Visit (INDEPENDENT_AMBULATORY_CARE_PROVIDER_SITE_OTHER): Payer: Self-pay

## 2016-11-30 ENCOUNTER — Ambulatory Visit (INDEPENDENT_AMBULATORY_CARE_PROVIDER_SITE_OTHER): Payer: BLUE CROSS/BLUE SHIELD | Admitting: Orthopaedic Surgery

## 2016-11-30 ENCOUNTER — Encounter (INDEPENDENT_AMBULATORY_CARE_PROVIDER_SITE_OTHER): Payer: Self-pay | Admitting: Orthopaedic Surgery

## 2016-11-30 VITALS — Ht 66.25 in | Wt 241.0 lb

## 2016-11-30 DIAGNOSIS — Z96641 Presence of right artificial hip joint: Secondary | ICD-10-CM | POA: Diagnosis not present

## 2016-11-30 DIAGNOSIS — M1612 Unilateral primary osteoarthritis, left hip: Secondary | ICD-10-CM | POA: Diagnosis not present

## 2016-11-30 MED ORDER — BUPROPION HCL ER (XL) 150 MG PO TB24: 150.0000 mg | ORAL_TABLET | Freq: Every day | ORAL | 0 refills | Status: DC

## 2016-11-30 MED ORDER — MELOXICAM 15 MG PO TABS: 15.0000 mg | ORAL_TABLET | Freq: Every day | ORAL | 0 refills | Status: DC

## 2016-11-30 MED ORDER — METHYLPREDNISOLONE 4 MG PO TABS: ORAL_TABLET | ORAL | 0 refills | Status: DC

## 2016-11-30 NOTE — Telephone Encounter (Signed)
Patient is calling to request a 90 day Rx instead of 30 day on her BuPROPion and Meloxicam please.    Pharmacy:  [facility]  Thank you.

## 2016-11-30 NOTE — Telephone Encounter (Signed)
Escribed

## 2016-11-30 NOTE — Telephone Encounter (Signed)
Please re prescribe as 90 days with one refill each requested prescription.

## 2016-11-30 NOTE — Progress Notes (Signed)
The patient is well-known to me. She is now 8 months status post a right total hip arthroplasty and is on well with that. She still has some trochanteric pain and IT band pain on the right and some numbness and tingling. Her left hip is been slowly hurting worse.  Examination shows fluid range of motion of her right hip but painful range of motion her left hip. Both hips hurt over the trochanteric area is well. Her leg lengths are equal. An AP pelvis shows a well-seated implant on the right side. Both hips show trochanteric changes with bone spurs consistent with trochanteric bursitis. 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 There is complete loss of joint space with particular osteophytes and sclerotic and cystic changes. This is dramatically different from x-rays compared to a year ago of her hip on the left side.  At this point at some time in the future she is going to consider a left total hip replacement. I will put her on a six-day steroid taper and she'll still continue alternating meloxicam and Tylenol arthritis. She is on other supplements for joint pain as well. She'll work on stretching exercises for the trochanteric area. I do not need see her back myself for 6 months. No x-rays are needed at that visit.

## 2017-01-03 ENCOUNTER — Other Ambulatory Visit: Payer: Self-pay | Admitting: Internal Medicine

## 2017-01-16 NOTE — Addendum Note (Signed)
Addendum  created 01/16/17 0910 by Duane Boston, MD   Sign clinical note

## 2017-02-23 ENCOUNTER — Telehealth (INDEPENDENT_AMBULATORY_CARE_PROVIDER_SITE_OTHER): Payer: Self-pay | Admitting: Orthopaedic Surgery

## 2017-02-23 ENCOUNTER — Other Ambulatory Visit (INDEPENDENT_AMBULATORY_CARE_PROVIDER_SITE_OTHER): Payer: Self-pay | Admitting: Orthopaedic Surgery

## 2017-02-23 MED ORDER — METHYLPREDNISOLONE 4 MG PO TABS: ORAL_TABLET | ORAL | 0 refills | Status: DC

## 2017-02-23 NOTE — Telephone Encounter (Signed)
Patient called asking for a refill on prednisone. CB # [phone]

## 2017-02-23 NOTE — Telephone Encounter (Signed)
Please advise 

## 2017-02-23 NOTE — Telephone Encounter (Signed)
I sent it in 

## 2017-03-10 ENCOUNTER — Ambulatory Visit (INDEPENDENT_AMBULATORY_CARE_PROVIDER_SITE_OTHER): Payer: BLUE CROSS/BLUE SHIELD | Admitting: Orthopaedic Surgery

## 2017-03-10 DIAGNOSIS — M1612 Unilateral primary osteoarthritis, left hip: Secondary | ICD-10-CM | POA: Diagnosis not present

## 2017-03-10 NOTE — Progress Notes (Signed)
The patient is well-known to me. She has 12 months out from her right total hip arthroplasty direct anterior approach. She has known osteoporosis in her left hip. She is trying to hold off on surgery on that side but feels like she may need in the near future. She has well-documented evidence of osteoporosis of her left hip. She has tried and failed all forms conservative treatment. She's been on anti-inflammatories as well. She has not had intra-articular steroid injection and I feel that this may be the next step for her.  On examination her right hip moves fluidly. The right left hip has pain of the trochanteric area but also pain with interaction rotation.  I would like to set her up for an intra-articular steroid injection by Dr. Ernestina Patches. This would be in her left hip. She would like to try this as well. We'll work on getting this scheduled and I'll see her back myself in about 2 months to see how this is for her. All questions were encouraged and answered.

## 2017-04-01 ENCOUNTER — Other Ambulatory Visit: Payer: Self-pay | Admitting: Internal Medicine

## 2017-04-01 NOTE — Telephone Encounter (Signed)
Needs appt. Last seen Feb for CPE

## 2017-04-02 ENCOUNTER — Ambulatory Visit (INDEPENDENT_AMBULATORY_CARE_PROVIDER_SITE_OTHER): Payer: BLUE CROSS/BLUE SHIELD | Admitting: Physical Medicine and Rehabilitation

## 2017-04-02 ENCOUNTER — Ambulatory Visit (INDEPENDENT_AMBULATORY_CARE_PROVIDER_SITE_OTHER): Payer: BLUE CROSS/BLUE SHIELD

## 2017-04-02 ENCOUNTER — Encounter (INDEPENDENT_AMBULATORY_CARE_PROVIDER_SITE_OTHER): Payer: Self-pay | Admitting: Physical Medicine and Rehabilitation

## 2017-04-02 DIAGNOSIS — M25552 Pain in left hip: Secondary | ICD-10-CM

## 2017-04-02 NOTE — Patient Instructions (Signed)

## 2017-04-02 NOTE — Progress Notes (Signed)
Stephanie Andrade - 61 y.o. female MRN [id number]  Date of birth: 26-Feb-1956  Office Visit Note: Visit Date: 04/02/2017 PCP: Margaree Mackintosh, MD Referred by: Margaree Mackintosh, MD  Subjective: Chief Complaint  Patient presents with  . Left Hip - Pain   HPI: [name] is a very pleasant 61 year old female with prior right total hip replacement approximately one year ago. She does have left hip osteoarthritis and left hip pain and groin pain. She's been followed by Dr. Magnus Ivan. They requested diagnostic and therapeutic anesthetic hip arthrogram.    ROS Otherwise per HPI.  Assessment & Plan: Visit Diagnoses:  1. Pain in left hip     Plan: Findings:  Diagnostic and hopefully therapeutic anesthetic hip arthrogram on the left. Patient had good relief during the anesthetic phase of the injection.    Meds & Orders: No orders of the defined types were placed in this encounter.   Orders Placed This Encounter  Procedures  . Large Joint Injection/Arthrocentesis  . XR C-ARM NO REPORT    Follow-up: Return if symptoms worsen or fail to improve, for Dr. Magnus Ivan.   Procedures: Large Joint Inj Date/Time: 04/02/2017 10:05 AM Performed by: Tyrell Antonio Authorized by: Tyrell Antonio   Consent Given by:  Patient Site marked: the procedure site was marked   Timeout: prior to procedure the correct patient, procedure, and site was verified   Indications:  Pain and diagnostic evaluation Location:  Hip Site:  L hip joint Prep: patient was prepped and draped in usual sterile fashion   Needle Size:  22 G Approach:  Anterior Ultrasound Guidance: No   Fluoroscopic Guidance: No   Arthrogram: Yes   Medications:  80 mg triamcinolone acetonide 40 MG/ML; 3 mL bupivacaine 0.5 % Aspiration Attempted: Yes   Patient tolerance:  Patient tolerated the procedure well with no immediate complications  Arthrogram demonstrated excellent flow of contrast throughout the joint surface without  extravasation or obvious defect.  The patient had relief of symptoms during the anesthetic phase of the injection.     No notes on file   Clinical History: No specialty comments available.  She reports that she quit smoking about 7 years ago. Her smoking use included Cigarettes. She has never used smokeless tobacco.   Recent Labs  10/08/16 1022  HGBA1C 5.2    Objective:  VS:  HT:    WT:   BMI:     BP:   HR: bpm  TEMP: ( )  RESP:  Physical Exam  Musculoskeletal:  Patient ambulates without aid she has an antalgic gait to the left. She does have pain with hip rotation internally.    Ortho Exam Imaging: No results found.  Past Medical/Family/Surgical/Social History: Medications & Allergies reviewed per EMR Patient Active Problem List   Diagnosis Date Noted  . Unilateral primary osteoarthritis, left hip 11/30/2016  . Biliary obstruction 09/20/2016  . Cholelithiases 09/20/2016  . Status post total replacement of right hip 03/24/2016  . Osteoarthritis of left hip 11/26/2015  . Impaired glucose tolerance 12/09/2014  . Plantar fascia rupture 06/16/2012  . History of vitamin D deficiency 11/15/2011  . History of sarcoidosis 11/15/2011  . Hot flashes not due to menopause 07/19/2011  . Spinal stenosis, lumbar region, with neurogenic claudication 03/10/2011  . Hypertension 02/11/2011  . Asthma 02/11/2011  . Morbid obesity (HCC) 02/11/2011  . Depression 02/11/2011  . Cervical radiculopathy 12/04/2010  . SHOULDER PAIN, BILATERAL 10/01/2010  . WRIST PAIN, BILATERAL 10/01/2010  . LUMBAGO 10/01/2010  .  Osteoarthritis of right hip 07/17/2009  . GREATER TROCHANTERIC BURSITIS 07/17/2009   Past Medical History:  Diagnosis Date  . Arthritis   . Asthma   . Depression   . Family history of adverse reaction to anesthesia    sister, brother - PONV  . Family history of ovarian cancer   . Fibroids   . History of anemia   . History of bronchitis   . History of pneumonia   .  History of sarcoidosis   . Hypertension   . PONV (postoperative nausea and vomiting) 03/26/2016  . Wears glasses    Family History  Problem Relation Age of Onset  . Cancer Mother   . Arthritis Father   . Hypertension Father   . Arthritis Sister   . Hypertension Brother    Past Surgical History:  Procedure Laterality Date  . CHOLECYSTECTOMY N/A 09/22/2016   Procedure: LAPAROSCOPIC CHOLECYSTECTOMY WITH INTRAOPERATIVE CHOLANGIOGRAM;  Surgeon: Harriette Bouillon, MD;  Location: [facility] OR;  Service: General;  Laterality: N/A;  . COLONOSCOPY    . ESOPHAGOGASTRODUODENOSCOPY    . JOINT REPLACEMENT    . 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   Procedure: RIGHT TOTAL HIP ARTHROPLASTY ANTERIOR APPROACH;  Surgeon: Kathryne Hitch, MD;  Location: [facility] OR;  Service: Orthopedics;  Laterality: Right;   Social History   Occupational History  . Not on file.   Social History Main Topics  . Smoking status: Former Smoker    Types: Cigarettes    Quit date: 06/20/2009  . Smokeless tobacco: Never Used     Comment: "smoked 2 weeks out of the year for about 15 years"   . Alcohol use Yes     Comment: socially  . Drug use: No  . Sexual activity: Not on file

## 2017-04-02 NOTE — Progress Notes (Deleted)
Persistent left hip and groin since June. Worse with walking and getting up and down. Pain into thigh. Ok with sitting.

## 2017-04-06 ENCOUNTER — Encounter (INDEPENDENT_AMBULATORY_CARE_PROVIDER_SITE_OTHER): Payer: Self-pay | Admitting: Physical Medicine and Rehabilitation

## 2017-04-06 MED ORDER — TRIAMCINOLONE ACETONIDE 40 MG/ML IJ SUSP
80.0000 mg | INTRAMUSCULAR | Status: AC | PRN
  Administered 2017-04-02: 80 mg via INTRA_ARTICULAR

## 2017-04-06 MED ORDER — BUPIVACAINE HCL 0.5 % IJ SOLN
3.0000 mL | INTRAMUSCULAR | Status: AC | PRN
  Administered 2017-04-02: 3 mL via INTRA_ARTICULAR

## 2017-04-12 ENCOUNTER — Ambulatory Visit (INDEPENDENT_AMBULATORY_CARE_PROVIDER_SITE_OTHER): Payer: BLUE CROSS/BLUE SHIELD | Admitting: Internal Medicine

## 2017-04-12 ENCOUNTER — Encounter: Payer: Self-pay | Admitting: Internal Medicine

## 2017-04-12 VITALS — BP 106/58 | HR 80 | Temp 98.3°F | Wt 250.0 lb

## 2017-04-12 DIAGNOSIS — M25552 Pain in left hip: Secondary | ICD-10-CM | POA: Diagnosis not present

## 2017-04-12 DIAGNOSIS — I10 Essential (primary) hypertension: Secondary | ICD-10-CM | POA: Diagnosis not present

## 2017-04-12 DIAGNOSIS — G8929 Other chronic pain: Secondary | ICD-10-CM | POA: Diagnosis not present

## 2017-04-12 DIAGNOSIS — Z9049 Acquired absence of other specified parts of digestive tract: Secondary | ICD-10-CM | POA: Diagnosis not present

## 2017-04-12 DIAGNOSIS — Z8659 Personal history of other mental and behavioral disorders: Secondary | ICD-10-CM | POA: Diagnosis not present

## 2017-04-12 DIAGNOSIS — G4709 Other insomnia: Secondary | ICD-10-CM | POA: Diagnosis not present

## 2017-04-12 MED ORDER — LOSARTAN POTASSIUM 50 MG PO TABS: 50.0000 mg | ORAL_TABLET | Freq: Every day | ORAL | 1 refills | Status: DC

## 2017-04-12 NOTE — Progress Notes (Signed)
Subjective:    Patient ID: Stephanie Andrade, female    DOB: 12/08/1955, 61 y.o.   MRN: [id number]  HPI   61 year old Female for 6 month recheck. Hx HTN treated with Losartan.Blood pressure under good control on this regimen.  She had right hip arthroplasty by Dr. Magnus Ivan August 2017. In February 2018 she had cholecystectomy.  Recently saw Dr. Magnus Ivan regarding left hip pain. He placed her on a short course of steroids in April and recommended meloxicam and Tylenol arthritis. He feels she'll need a left hip replacement eventually.  She remains overweight. Did not want to continue with Contrave of after a short trial of it.  She takes Ambien 5 mg to sleep.  History of asthmatic bronchitis and has albuterol inhaler on hand.       Review of Systems see above     Objective:   Physical Exam Skin warm and dry. Nodes none. Neck is supple. Chest clear. Cardiac exam regular rate and rhythm normal S1 and S2. Extremities without edema       Assessment & Plan:  Essential hypertension-stable on losartan 50 mg daily  Insomnia-Ambien refilled mid-August okay to refill until physical exam in February  History of depression and dysthymia treated with Wellbutrin  History of asthmatic bronchitis treated with when necessary albuterol

## 2017-04-13 NOTE — Patient Instructions (Addendum)
It was pleasure to see you today. Continue losartan for hypertension. Continue Ambien for insomnia. Return in 6 months for physical exam IEF February 26

## 2017-05-25 ENCOUNTER — Ambulatory Visit (INDEPENDENT_AMBULATORY_CARE_PROVIDER_SITE_OTHER): Payer: BLUE CROSS/BLUE SHIELD | Admitting: Orthopaedic Surgery

## 2017-05-25 DIAGNOSIS — M1612 Unilateral primary osteoarthritis, left hip: Secondary | ICD-10-CM

## 2017-05-25 DIAGNOSIS — Z96641 Presence of right artificial hip joint: Secondary | ICD-10-CM

## 2017-05-25 MED ORDER — HYDROCODONE-ACETAMINOPHEN 5-325 MG PO TABS: 1.0000 | ORAL_TABLET | Freq: Two times a day (BID) | ORAL | 0 refills | Status: DC | PRN

## 2017-05-25 NOTE — Progress Notes (Signed)
The patient is over a year status post a right total hip arthroplasty through direct injury approach and she is done excellent from that. She has severe osteoarthritis and degenerative joint disease of her left hip is well documented. She had intra-articular steroid injection in August of this year in that hip and did well for a while. Her pain is back to being daily. His left hip and is in her groin. She walks a limp. Her pain can be 10 out of 10 at times. It is detrimentally affects her activity is daily living, her quality of life, her mobility. This pain is been present for over a year now. She is tried and failed all forms conservative treatment including weight loss, activity modification, anti-inflammatories, intra-articular injections, using assistive device and physical therapy. X-rays confirm severe end-stage arthritis of that left hip with complete loss of joint space as well as cystic changes, joint space narrowing, and per trigger osteophytes as well as sclerotic changes.  On exam her right hip moves fluidly her left hip has severe pain with internal/external rotation.  She would like to have a left total hip arthroplasty in January 2019. We long and thorough discussion about surgery including the risk and benefits of this. In the interim we will set her up for a repeat steroid injection in November one more time in her left hip to Dr. Ernestina Patches. We would then see her back in 2 weeks postoperatively after her January surgery. All questions concerns were answered and addressed.

## 2017-05-25 NOTE — Addendum Note (Signed)
Addended byLaurann Montana on: 05/25/2017 11:44 AM   Modules accepted: Orders

## 2017-06-18 ENCOUNTER — Ambulatory Visit (INDEPENDENT_AMBULATORY_CARE_PROVIDER_SITE_OTHER): Payer: BLUE CROSS/BLUE SHIELD | Admitting: Physical Medicine and Rehabilitation

## 2017-07-06 ENCOUNTER — Other Ambulatory Visit: Payer: Self-pay | Admitting: Internal Medicine

## 2017-07-06 NOTE — Telephone Encounter (Signed)
Verbal order to refill for 6 months.  Sending to [place] to send to pharmacy.

## 2017-08-05 DIAGNOSIS — Z1389 Encounter for screening for other disorder: Secondary | ICD-10-CM | POA: Diagnosis not present

## 2017-08-05 DIAGNOSIS — Z780 Asymptomatic menopausal state: Secondary | ICD-10-CM | POA: Diagnosis not present

## 2017-08-05 DIAGNOSIS — N952 Postmenopausal atrophic vaginitis: Secondary | ICD-10-CM | POA: Diagnosis not present

## 2017-08-05 DIAGNOSIS — Z130 Encounter for screening for diseases of the blood and blood-forming organs and certain disorders involving the immune mechanism: Secondary | ICD-10-CM | POA: Diagnosis not present

## 2017-08-05 DIAGNOSIS — Z01419 Encounter for gynecological examination (general) (routine) without abnormal findings: Secondary | ICD-10-CM | POA: Diagnosis not present

## 2017-08-05 DIAGNOSIS — Z8041 Family history of malignant neoplasm of ovary: Secondary | ICD-10-CM | POA: Diagnosis not present

## 2017-08-05 DIAGNOSIS — Z1231 Encounter for screening mammogram for malignant neoplasm of breast: Secondary | ICD-10-CM | POA: Diagnosis not present

## 2017-08-05 DIAGNOSIS — Z6837 Body mass index (BMI) 37.0-37.9, adult: Secondary | ICD-10-CM | POA: Diagnosis not present

## 2017-08-08 ENCOUNTER — Other Ambulatory Visit (INDEPENDENT_AMBULATORY_CARE_PROVIDER_SITE_OTHER): Payer: Self-pay | Admitting: Physician Assistant

## 2017-08-12 NOTE — Pre-Procedure Instructions (Signed)
Stephanie Andrade  08/12/2017      [place], [place] [address] Phone: [phone] Fax: [phone]    Your procedure is scheduled on Jan 8  Report to [facility] at  1230  Call this number if you have problems the morning of surgery:  [phone]   Remember:  Do not eat food or drink liquids after midnight.  Take these medicines the morning of surgery with A SIP OF WATER Bupropion (Wellbutrin Xl), Hydrocodone (Norco)  Stop taking aspirin, BC's, Goody's, Herbal medications, Fish Oil, Ibuprofen, Advil, Motrin, Aleve   Do not wear jewelry, make-up or nail polish.  Do not wear lotions, powders, or perfumes, or deodorant.  Do not shave 48 hours prior to surgery.  Men may shave face and neck.  Do not bring valuables to the hospital.  [facility] is not responsible for any belongings or valuables.  Contacts, dentures or bridgework may not be worn into surgery.  Leave your suitcase in the car.  After surgery it may be brought to your room.  For patients admitted to the hospital, discharge time will be determined by your treatment team.  Patients discharged the day of surgery will not be allowed to drive home.    Special instructions:  [facility] - Preparing for Surgery  Before surgery, you can play an important role.  Because skin is not sterile, your skin needs to be as free of germs as possible.  You can reduce the number of germs on you skin by washing with CHG (chlorahexidine gluconate) soap before surgery.  CHG is an antiseptic cleaner which kills germs and bonds with the skin to continue killing germs even after washing.  Please DO NOT use if you have an allergy to CHG or antibacterial soaps.  If your skin becomes reddened/irritated stop using the CHG and inform your nurse when you arrive at Short Stay.  Do not shave (including legs and underarms) for at least 48  hours prior to the first CHG shower.  You may shave your face.  Please follow these instructions carefully:   1.  Shower with CHG Soap the night before surgery and the  morning of Surgery.  2.  If you choose to wash your hair, wash your hair first as usual with your normal shampoo.  3.  After you shampoo, rinse your hair and body thoroughly to remove the Shampoo.  4.  Use CHG as you would any other liquid soap.  You can apply chg directly to the skin and wash gently with scrungie or a clean washcloth.  5.  Apply the CHG Soap to your body ONLY FROM THE NECK DOWN.    Do not use on open wounds or open sores.  Avoid contact with your eyes,  ears, mouth and genitals (private parts).  Wash genitals (private parts) with your normal soap.  6.  Wash thoroughly, paying special attention to the area where your surgery will be performed.  7.  Thoroughly rinse your body with warm water from the neck down.  8.  DO NOT shower/wash with your normal soap after using and rinsing off the CHG Soap.  9.  Pat yourself dry with a clean towel.            10.  Wear clean pajamas.            11.  Place clean sheets on your  bed the night of your first shower and do not sleep with pets.  Day of Surgery  Do not apply any lotions/deoderants the morning of surgery.  Please wear clean clothes to the hospital/surgery center.     Please read over the following fact sheets that you were given. Pain Booklet, Coughing and Deep Breathing, MRSA Information and Surgical Site Infection Prevention

## 2017-08-13 ENCOUNTER — Telehealth (INDEPENDENT_AMBULATORY_CARE_PROVIDER_SITE_OTHER): Payer: Self-pay | Admitting: Orthopaedic Surgery

## 2017-08-13 ENCOUNTER — Encounter (HOSPITAL_COMMUNITY)
Admission: RE | Admit: 2017-08-13 | Discharge: 2017-08-13 | Disposition: A | Discharge status: Home / Self Care | Payer: BLUE CROSS/BLUE SHIELD | Source: Ambulatory Visit | Attending: Orthopaedic Surgery | Admitting: Orthopaedic Surgery

## 2017-08-13 ENCOUNTER — Telehealth (INDEPENDENT_AMBULATORY_CARE_PROVIDER_SITE_OTHER): Payer: Self-pay

## 2017-08-13 ENCOUNTER — Encounter (HOSPITAL_COMMUNITY): Payer: Self-pay

## 2017-08-13 ENCOUNTER — Other Ambulatory Visit: Payer: Self-pay

## 2017-08-13 DIAGNOSIS — Z01812 Encounter for preprocedural laboratory examination: Secondary | ICD-10-CM | POA: Insufficient documentation

## 2017-08-13 DIAGNOSIS — Z01818 Encounter for other preprocedural examination: Secondary | ICD-10-CM | POA: Diagnosis not present

## 2017-08-13 DIAGNOSIS — M1612 Unilateral primary osteoarthritis, left hip: Secondary | ICD-10-CM | POA: Insufficient documentation

## 2017-08-13 HISTORY — DX: Pneumonia, unspecified organism: J18.9

## 2017-08-13 HISTORY — DX: Gastro-esophageal reflux disease without esophagitis: K21.9

## 2017-08-13 LAB — SURGICAL PCR SCREEN
MRSA, PCR: NEGATIVE
STAPHYLOCOCCUS AUREUS: NEGATIVE

## 2017-08-13 LAB — BASIC METABOLIC PANEL
Anion gap: 6 (ref 5–15)
BUN: 19 mg/dL (ref 6–20)
CALCIUM: 9.3 mg/dL (ref 8.9–10.3)
CO2: 25 mmol/L (ref 22–32)
CREATININE: 0.79 mg/dL (ref 0.44–1.00)
Chloride: 107 mmol/L (ref 101–111)
GFR calc Af Amer: >60 mL/min (ref >60)
GLUCOSE: 96 mg/dL (ref 65–99)
POTASSIUM: 4.1 mmol/L (ref 3.5–5.1)
Sodium: 138 mmol/L (ref 135–145)

## 2017-08-13 LAB — CBC
HEMATOCRIT: 36.1 % (ref 36.0–46.0)
Hemoglobin: 11.7 g/dL — ABNORMAL LOW (ref 12.0–15.0)
MCH: 26.3 pg (ref 26.0–34.0)
MCHC: 32.4 g/dL (ref 30.0–36.0)
MCV: 81.1 fL (ref 78.0–100.0)
PLATELETS: 202 10*3/uL (ref 150–400)
RBC: 4.45 MIL/uL (ref 3.87–5.11)
RDW: 13.2 % (ref 11.5–15.5)
WBC: 6.5 10*3/uL (ref 4.0–10.5)

## 2017-08-13 NOTE — Telephone Encounter (Signed)
Work her in next week with me or Artis Delay. Thanks

## 2017-08-13 NOTE — Telephone Encounter (Signed)
IC s/w patient and advised  

## 2017-08-13 NOTE — Pre-Procedure Instructions (Signed)
SHAQUAN PUERTA  08/13/2017      [place], [place] [address] Phone: [phone] Fax: [phone]    Your procedure is scheduled on Jan 8  Report to [facility] at  1230  Call this number if you have problems the morning of surgery:  [phone]   Remember:  Do not eat food or drink liquids after midnight.  Take these medicines the morning of surgery with A SIP OF WATER Bupropion (Wellbutrin Xl), Hydrocodone (Norco)  Stop taking aspirin, BC's, Goody's, Herbal medications, Fish Oil, Ibuprofen, Advil, Motrin, Aleve starting 08/17/17 including fish oil, vitamins, supplements   Do not wear jewelry, make-up or nail polish.  Do not wear lotions, powders, or perfumes, or deodorant.  Do not shave 48 hours prior to surgery.  Men may shave face and neck.  Do not bring valuables to the hospital.  [facility] is not responsible for any belongings or valuables.  Contacts, dentures or bridgework may not be worn into surgery.  Leave your suitcase in the car.  After surgery it may be brought to your room.  For patients admitted to the hospital, discharge time will be determined by your treatment team.  Patients discharged the day of surgery will not be allowed to drive home.    Special instructions:  [facility] - Preparing for Surgery  Before surgery, you can play an important role.  Because skin is not sterile, your skin needs to be as free of germs as possible.  You can reduce the number of germs on you skin by washing with CHG (chlorahexidine gluconate) soap before surgery.  CHG is an antiseptic cleaner which kills germs and bonds with the skin to continue killing germs even after washing.  Please DO NOT use if you have an allergy to CHG or antibacterial soaps.  If your skin becomes reddened/irritated stop using the CHG and inform your nurse when you arrive at Short Stay.  Do  not shave (including legs and underarms) for at least 48 hours prior to the first CHG shower.  You may shave your face.  Please follow these instructions carefully:   1.  Shower with CHG Soap the night before surgery and the  morning of Surgery.  2.  If you choose to wash your hair, wash your hair first as usual with your normal shampoo.  3.  After you shampoo, rinse your hair and body thoroughly to remove the Shampoo.  4.  Use CHG as you would any other liquid soap.  You can apply chg directly to the skin and wash gently with scrungie or a clean washcloth.  5.  Apply the CHG Soap to your body ONLY FROM THE NECK DOWN.    Do not use on open wounds or open sores.  Avoid contact with your eyes,  ears, mouth and genitals (private parts).  Wash genitals (private parts) with your normal soap.  6.  Wash thoroughly, paying special attention to the area where your surgery will be performed.  7.  Thoroughly rinse your body with warm water from the neck down.  8.  DO NOT shower/wash with your normal soap after using and rinsing off the CHG Soap.  9.  Pat yourself dry with a clean towel.            10.  Wear clean pajamas.  11.  Place clean sheets on your bed the night of your first shower and do not sleep with pets.  Day of Surgery  Do not apply any lotions/deoderants the morning of surgery.  Please wear clean clothes to the hospital/surgery center.     Please read over the  fact sheets that you were given.

## 2017-08-13 NOTE — Telephone Encounter (Signed)
Please advise if there are any pre-op exercises you would like for patient to begin.

## 2017-08-13 NOTE — Telephone Encounter (Signed)
No real exercises needed.  Just stay active

## 2017-08-13 NOTE — Telephone Encounter (Signed)
Patient called back with concerns that her left knee issue that she has been having for about a month is going to interfere with her recovery of her left THA. She states that she has been walking with a cane for about a month, it occasionally does lock up on her when she is standing. Wanted to know if you felt she should come in for her knee to be checked prior to her hip surgery or if you felt she would be ok. please advise. [phone]

## 2017-08-13 NOTE — Telephone Encounter (Signed)
Patient is having a left total hip replacement on 08/24/2017. She remembered from her other hip replacement what exercises to start doing, she cant do these because of her knee now. Please advise her what to do [phone]. Her pre op appt is at 9 a.m. so she is okay if you leave a message for her.

## 2017-08-13 NOTE — Telephone Encounter (Signed)
appt scheduled

## 2017-08-16 ENCOUNTER — Other Ambulatory Visit (INDEPENDENT_AMBULATORY_CARE_PROVIDER_SITE_OTHER): Payer: Self-pay

## 2017-08-18 ENCOUNTER — Ambulatory Visit (INDEPENDENT_AMBULATORY_CARE_PROVIDER_SITE_OTHER): Payer: BLUE CROSS/BLUE SHIELD | Admitting: Orthopaedic Surgery

## 2017-08-18 ENCOUNTER — Ambulatory Visit (INDEPENDENT_AMBULATORY_CARE_PROVIDER_SITE_OTHER): Payer: Self-pay

## 2017-08-18 ENCOUNTER — Encounter (INDEPENDENT_AMBULATORY_CARE_PROVIDER_SITE_OTHER): Payer: Self-pay | Admitting: Orthopaedic Surgery

## 2017-08-18 DIAGNOSIS — M25562 Pain in left knee: Secondary | ICD-10-CM | POA: Diagnosis not present

## 2017-08-18 MED ORDER — LIDOCAINE HCL 1 % IJ SOLN
3.0000 mL | INTRAMUSCULAR | Status: AC | PRN
  Administered 2017-08-18: 3 mL

## 2017-08-18 MED ORDER — METHYLPREDNISOLONE ACETATE 40 MG/ML IJ SUSP
40.0000 mg | INTRAMUSCULAR | Status: AC | PRN
  Administered 2017-08-18: 40 mg via INTRA_ARTICULAR

## 2017-08-18 NOTE — Progress Notes (Signed)
Office Visit Note   Patient: Stephanie Andrade           Date of Birth: 07-04-1956           MRN: [id number] Visit Date: 08/18/2017              Requested by: Margaree Mackintosh, MD [address] [place], [place] [address] PCP: Margaree Mackintosh, MD   Assessment & Plan: Visit Diagnoses:  1. Acute pain of left knee     Plan: This coming Tuesday.  I think this will help get her through the pain from the hip replacement surgery and that hip arthritis may be referring down to her knee.  There would be nothing else to do for the knee right now given her otherwise normal exam other than pain.  I gave her reassurance that I think her knee is been doing better with time and hopefully having her hip replaced will help.  I do feel that she would benefit from a steroid injection in her left knee in the interim and she agreed with this as well.  We will see her back at her regular follow-up 2 weeks after hip replacement surgery left side.  Follow-Up Instructions: Return for 2 weeks post-op.   Orders:  Orders Placed This Encounter  Procedures  . Large Joint Inj  . XR Knee 1-2 Views Left   No orders of the defined types were placed in this encounter.     Procedures: Large Joint Inj: L knee on 08/18/2017 4:50 PM Indications: diagnostic evaluation and pain Details: 22 G 1.5 in needle, superolateral approach  Arthrogram: No  Medications: 3 mL lidocaine 1 %; 40 mg methylPREDNISolone acetate 40 MG/ML Outcome: tolerated well, no immediate complications Procedure, treatment alternatives, risks and benefits explained, specific risks discussed. Consent was given by the patient. Immediately prior to procedure a time out was called to verify the correct patient, procedure, equipment, support staff and site/side marked as required. Patient was prepped and draped in the usual sterile fashion.       Clinical Data: No additional findings.   Subjective: Chief Complaint  Patient presents with  .  Left Knee - Pain  The patient is coming today for evaluation of acute left knee pain.  We actually have her scheduled for a left total hip arthroplasty this coming Tuesday, January 9.  However her knee is suddenly having a lot of pain with popping she is having a lot of problems with it and she wants it to be checked out and hopefully feeling better before going through hip replacement so she can rehab appropriately.  She has been doing water aerobics as well and has had no specific injury she is aware of but this came on all of a sudden is been quite painful to her.  HPI  Review of Systems She currently denies any headache, chest pain, shortness of breath, fever, chills, nausea, vomiting.  Objective: Vital Signs: LMP 04/06/2011   Physical Exam Is alert and oriented x3 in no acute distress Ortho Exam Examination of her left knee does show medial joint line pain with full range of motion and no effusion and no warmth to the joint.  There is patellofemoral crepitation as well.  Her Lachman's and McMurray's are negative.  I do feel that she would benefit from a steroid injection in her left knee given her upcoming surgery Specialty Comments:  No specialty comments available.  Imaging: Xr Knee 1-2 Views Left  Result Date: 08/18/2017  2 views of her left knee show moderate arthritic findings with medial joint space narrowing and patellofemoral arthritic changes with periarticular osteophytes throughout the knee.  There is otherwise no acute findings.    PMFS History: Patient Active Problem List   Diagnosis Date Noted  . Unilateral primary osteoarthritis, left hip 11/30/2016  . Biliary obstruction 09/20/2016  . Cholelithiases 09/20/2016  . Status post total replacement of right hip 03/24/2016  . Osteoarthritis of left hip 11/26/2015  . Impaired glucose tolerance 12/09/2014  . Plantar fascia rupture 06/16/2012  . History of vitamin D deficiency 11/15/2011  . History of sarcoidosis  11/15/2011  . Hot flashes not due to menopause 07/19/2011  . Spinal stenosis, lumbar region, with neurogenic claudication 03/10/2011  . Hypertension 02/11/2011  . Asthma 02/11/2011  . Morbid obesity (HCC) 02/11/2011  . Depression 02/11/2011  . Cervical radiculopathy 12/04/2010  . SHOULDER PAIN, BILATERAL 10/01/2010  . WRIST PAIN, BILATERAL 10/01/2010  . LUMBAGO 10/01/2010  . Osteoarthritis of right hip 07/17/2009  . GREATER TROCHANTERIC BURSITIS 07/17/2009   Past Medical History:  Diagnosis Date  . Arthritis   . Asthma   . Depression   . Family history of adverse reaction to anesthesia    sister, brother - PONV  . Family history of ovarian cancer   . Fibroids   . GERD (gastroesophageal reflux disease)    occ  . History of anemia   . History of bronchitis   . History of pneumonia   . History of sarcoidosis   . Hypertension   . Pneumonia    hx  . PONV (postoperative nausea and vomiting) 03/26/2016  . Wears glasses     Family History  Problem Relation Age of Onset  . Cancer Mother   . Arthritis Father   . Hypertension Father   . Arthritis Sister   . Hypertension Brother     Past Surgical History:  Procedure Laterality Date  . CHOLECYSTECTOMY N/A 09/22/2016   Procedure: LAPAROSCOPIC CHOLECYSTECTOMY WITH INTRAOPERATIVE CHOLANGIOGRAM;  Surgeon: Harriette Bouillon, MD;  Location: [facility] OR;  Service: General;  Laterality: N/A;  . COLONOSCOPY    . ESOPHAGOGASTRODUODENOSCOPY    . JOINT REPLACEMENT    . 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   Procedure: RIGHT TOTAL HIP ARTHROPLASTY ANTERIOR APPROACH;  Surgeon: Kathryne Hitch, MD;  Location[facility];  Service: Orthopedics;  Laterality: Right;   Social History   Occupational History  . Not on file  Tobacco Use  . Smoking status: Former Smoker    Types: Cigarettes    Last attempt to quit: 06/20/2009    Years since quitting: 8.1  . Smokeless tobacco: Never Used  . Tobacco comment: "smoked 2 weeks out of the year for about  15 years"   Substance and Sexual Activity  . Alcohol use: Yes    Comment: socially  . Drug use: No  . Sexual activity: Not on file

## 2017-08-23 MED ORDER — TRANEXAMIC ACID 1000 MG/10ML IV SOLN
1000.0000 mg | INTRAVENOUS | Status: AC
  Administered 2017-08-24: 1000 mg via INTRAVENOUS
  Filled 2017-08-23: qty 1100

## 2017-08-24 ENCOUNTER — Inpatient Hospital Stay (HOSPITAL_COMMUNITY): Payer: BLUE CROSS/BLUE SHIELD

## 2017-08-24 ENCOUNTER — Inpatient Hospital Stay (HOSPITAL_COMMUNITY)
Admission: RE | Admit: 2017-08-24 | Discharge: 2017-08-26 | DRG: 470 | Disposition: A | Discharge status: Home Health | Payer: BLUE CROSS/BLUE SHIELD | Source: Ambulatory Visit | Attending: Orthopaedic Surgery | Admitting: Orthopaedic Surgery

## 2017-08-24 ENCOUNTER — Inpatient Hospital Stay (HOSPITAL_COMMUNITY): Payer: BLUE CROSS/BLUE SHIELD | Admitting: Anesthesiology

## 2017-08-24 ENCOUNTER — Encounter (HOSPITAL_COMMUNITY): Payer: Self-pay | Admitting: *Deleted

## 2017-08-24 ENCOUNTER — Encounter (HOSPITAL_COMMUNITY)
Admission: RE | Disposition: A | Discharge status: Home Health | Payer: Self-pay | Source: Ambulatory Visit | Attending: Orthopaedic Surgery

## 2017-08-24 DIAGNOSIS — Z8041 Family history of malignant neoplasm of ovary: Secondary | ICD-10-CM

## 2017-08-24 DIAGNOSIS — Z881 Allergy status to other antibiotic agents status: Secondary | ICD-10-CM | POA: Diagnosis not present

## 2017-08-24 DIAGNOSIS — Z91018 Allergy to other foods: Secondary | ICD-10-CM

## 2017-08-24 DIAGNOSIS — Z9181 History of falling: Secondary | ICD-10-CM | POA: Diagnosis not present

## 2017-08-24 DIAGNOSIS — J45909 Unspecified asthma, uncomplicated: Secondary | ICD-10-CM | POA: Diagnosis not present

## 2017-08-24 DIAGNOSIS — Z6841 Body Mass Index (BMI) 40.0 and over, adult: Secondary | ICD-10-CM | POA: Diagnosis not present

## 2017-08-24 DIAGNOSIS — Z888 Allergy status to other drugs, medicaments and biological substances status: Secondary | ICD-10-CM | POA: Diagnosis not present

## 2017-08-24 DIAGNOSIS — Z87891 Personal history of nicotine dependence: Secondary | ICD-10-CM | POA: Diagnosis not present

## 2017-08-24 DIAGNOSIS — Z885 Allergy status to narcotic agent status: Secondary | ICD-10-CM

## 2017-08-24 DIAGNOSIS — M1612 Unilateral primary osteoarthritis, left hip: Principal | ICD-10-CM | POA: Diagnosis present

## 2017-08-24 DIAGNOSIS — Z8249 Family history of ischemic heart disease and other diseases of the circulatory system: Secondary | ICD-10-CM

## 2017-08-24 DIAGNOSIS — D62 Acute posthemorrhagic anemia: Secondary | ICD-10-CM | POA: Diagnosis not present

## 2017-08-24 DIAGNOSIS — M5412 Radiculopathy, cervical region: Secondary | ICD-10-CM | POA: Diagnosis present

## 2017-08-24 DIAGNOSIS — Z8701 Personal history of pneumonia (recurrent): Secondary | ICD-10-CM | POA: Diagnosis not present

## 2017-08-24 DIAGNOSIS — Z91048 Other nonmedicinal substance allergy status: Secondary | ICD-10-CM

## 2017-08-24 DIAGNOSIS — I10 Essential (primary) hypertension: Secondary | ICD-10-CM | POA: Diagnosis not present

## 2017-08-24 DIAGNOSIS — F329 Major depressive disorder, single episode, unspecified: Secondary | ICD-10-CM | POA: Diagnosis not present

## 2017-08-24 DIAGNOSIS — M25552 Pain in left hip: Secondary | ICD-10-CM | POA: Diagnosis present

## 2017-08-24 DIAGNOSIS — Z419 Encounter for procedure for purposes other than remedying health state, unspecified: Secondary | ICD-10-CM

## 2017-08-24 DIAGNOSIS — Z8261 Family history of arthritis: Secondary | ICD-10-CM

## 2017-08-24 DIAGNOSIS — Z87892 Personal history of anaphylaxis: Secondary | ICD-10-CM

## 2017-08-24 DIAGNOSIS — Z9049 Acquired absence of other specified parts of digestive tract: Secondary | ICD-10-CM

## 2017-08-24 DIAGNOSIS — M48062 Spinal stenosis, lumbar region with neurogenic claudication: Secondary | ICD-10-CM | POA: Diagnosis present

## 2017-08-24 DIAGNOSIS — Z471 Aftercare following joint replacement surgery: Secondary | ICD-10-CM | POA: Diagnosis not present

## 2017-08-24 DIAGNOSIS — Z96642 Presence of left artificial hip joint: Secondary | ICD-10-CM | POA: Diagnosis not present

## 2017-08-24 DIAGNOSIS — Z96641 Presence of right artificial hip joint: Secondary | ICD-10-CM | POA: Diagnosis not present

## 2017-08-24 HISTORY — DX: Anemia, unspecified: D64.9

## 2017-08-24 HISTORY — PX: TOTAL HIP ARTHROPLASTY: SHX124

## 2017-08-24 SURGERY — ARTHROPLASTY, HIP, TOTAL, ANTERIOR APPROACH
Anesthesia: Spinal | Site: Hip | Laterality: Left

## 2017-08-24 MED ORDER — GABAPENTIN 100 MG PO CAPS
100.0000 mg | ORAL_CAPSULE | Freq: Three times a day (TID) | ORAL | Status: DC
  Filled 2017-08-24 (×4): qty 1

## 2017-08-24 MED ORDER — VITAMIN D 1000 UNITS PO TABS
5000.0000 [IU] | ORAL_TABLET | Freq: Every day | ORAL | Status: DC
  Administered 2017-08-25 – 2017-08-26 (×2): 5000 [IU] via ORAL
  Filled 2017-08-24 (×2): qty 5

## 2017-08-24 MED ORDER — POLYETHYLENE GLYCOL 3350 17 G PO PACK: 17.0000 g | PACK | Freq: Every day | ORAL | Status: DC | PRN

## 2017-08-24 MED ORDER — ZOLPIDEM TARTRATE 5 MG PO TABS: 5.0000 mg | ORAL_TABLET | Freq: Every evening | ORAL | Status: DC | PRN

## 2017-08-24 MED ORDER — FENTANYL CITRATE (PF) 250 MCG/5ML IJ SOLN
INTRAMUSCULAR | Status: AC
  Filled 2017-08-24: qty 5

## 2017-08-24 MED ORDER — PHENOL 1.4 % MT LIQD: 1.0000 | OROMUCOSAL | Status: DC | PRN

## 2017-08-24 MED ORDER — ACETAMINOPHEN 325 MG PO TABS
650.0000 mg | ORAL_TABLET | ORAL | Status: DC | PRN
  Administered 2017-08-25 – 2017-08-26 (×3): 650 mg via ORAL
  Filled 2017-08-24 (×3): qty 2

## 2017-08-24 MED ORDER — FENTANYL CITRATE (PF) 100 MCG/2ML IJ SOLN
INTRAMUSCULAR | Status: DC | PRN
  Administered 2017-08-24: 50 ug via INTRAVENOUS

## 2017-08-24 MED ORDER — LOSARTAN POTASSIUM 50 MG PO TABS
50.0000 mg | ORAL_TABLET | Freq: Every day | ORAL | Status: DC
  Administered 2017-08-25 – 2017-08-26 (×2): 50 mg via ORAL
  Filled 2017-08-24 (×2): qty 1

## 2017-08-24 MED ORDER — DOCUSATE SODIUM 100 MG PO CAPS
100.0000 mg | ORAL_CAPSULE | Freq: Two times a day (BID) | ORAL | Status: DC
  Administered 2017-08-24 – 2017-08-26 (×4): 100 mg via ORAL
  Filled 2017-08-24 (×4): qty 1

## 2017-08-24 MED ORDER — CHLORHEXIDINE GLUCONATE 4 % EX LIQD: 60.0000 mL | Freq: Once | CUTANEOUS | Status: DC

## 2017-08-24 MED ORDER — ONDANSETRON HCL 4 MG/2ML IJ SOLN
INTRAMUSCULAR | Status: DC | PRN
  Administered 2017-08-24: 4 mg via INTRAVENOUS

## 2017-08-24 MED ORDER — ONDANSETRON HCL 4 MG PO TABS: 4.0000 mg | ORAL_TABLET | Freq: Four times a day (QID) | ORAL | Status: DC | PRN

## 2017-08-24 MED ORDER — SODIUM CHLORIDE 0.9 % IR SOLN
Status: DC | PRN
  Administered 2017-08-24: 3000 mL

## 2017-08-24 MED ORDER — DIPHENHYDRAMINE HCL 12.5 MG/5ML PO ELIX: 12.5000 mg | ORAL_SOLUTION | ORAL | Status: DC | PRN

## 2017-08-24 MED ORDER — METHOCARBAMOL 500 MG PO TABS
500.0000 mg | ORAL_TABLET | Freq: Four times a day (QID) | ORAL | Status: DC | PRN
  Administered 2017-08-24 – 2017-08-26 (×4): 500 mg via ORAL
  Filled 2017-08-24 (×4): qty 1

## 2017-08-24 MED ORDER — PROPOFOL 10 MG/ML IV BOLUS
INTRAVENOUS | Status: AC
  Filled 2017-08-24: qty 20

## 2017-08-24 MED ORDER — OXYCODONE HCL 5 MG PO TABS
10.0000 mg | ORAL_TABLET | ORAL | Status: DC | PRN
  Administered 2017-08-25 – 2017-08-26 (×8): 10 mg via ORAL
  Filled 2017-08-24 (×8): qty 2

## 2017-08-24 MED ORDER — DEXAMETHASONE SODIUM PHOSPHATE 10 MG/ML IJ SOLN
INTRAMUSCULAR | Status: AC
  Filled 2017-08-24: qty 1

## 2017-08-24 MED ORDER — ONDANSETRON HCL 4 MG/2ML IJ SOLN
INTRAMUSCULAR | Status: AC
  Filled 2017-08-24: qty 4

## 2017-08-24 MED ORDER — CEFAZOLIN SODIUM-DEXTROSE 1-4 GM/50ML-% IV SOLN
1.0000 g | Freq: Four times a day (QID) | INTRAVENOUS | Status: AC
  Administered 2017-08-24: 1 g via INTRAVENOUS
  Filled 2017-08-24 (×2): qty 50

## 2017-08-24 MED ORDER — ALUM & MAG HYDROXIDE-SIMETH 200-200-20 MG/5ML PO SUSP: 30.0000 mL | ORAL | Status: DC | PRN

## 2017-08-24 MED ORDER — HYDROMORPHONE HCL 1 MG/ML IJ SOLN: 0.2500 mg | INTRAMUSCULAR | Status: DC | PRN

## 2017-08-24 MED ORDER — ONDANSETRON HCL 4 MG/2ML IJ SOLN: 4.0000 mg | Freq: Four times a day (QID) | INTRAMUSCULAR | Status: DC | PRN

## 2017-08-24 MED ORDER — 0.9 % SODIUM CHLORIDE (POUR BTL) OPTIME
TOPICAL | Status: DC | PRN
  Administered 2017-08-24: 1000 mL

## 2017-08-24 MED ORDER — METHOCARBAMOL 1000 MG/10ML IJ SOLN
500.0000 mg | Freq: Four times a day (QID) | INTRAVENOUS | Status: DC | PRN
  Filled 2017-08-24: qty 5

## 2017-08-24 MED ORDER — CEFAZOLIN SODIUM-DEXTROSE 1-4 GM/50ML-% IV SOLN
1.0000 g | Freq: Four times a day (QID) | INTRAVENOUS | Status: DC
  Filled 2017-08-24 (×2): qty 50

## 2017-08-24 MED ORDER — ASPIRIN EC 325 MG PO TBEC
325.0000 mg | DELAYED_RELEASE_TABLET | Freq: Every day | ORAL | Status: DC
  Administered 2017-08-25 – 2017-08-26 (×2): 325 mg via ORAL
  Filled 2017-08-24 (×2): qty 1

## 2017-08-24 MED ORDER — ALBUMIN HUMAN 5 % IV SOLN
INTRAVENOUS | Status: DC | PRN
  Administered 2017-08-24: 16:00:00 via INTRAVENOUS

## 2017-08-24 MED ORDER — METOCLOPRAMIDE HCL 5 MG PO TABS: 5.0000 mg | ORAL_TABLET | Freq: Three times a day (TID) | ORAL | Status: DC | PRN

## 2017-08-24 MED ORDER — HYDROCODONE-ACETAMINOPHEN 5-325 MG PO TABS
1.0000 | ORAL_TABLET | ORAL | Status: DC | PRN
  Administered 2017-08-24 – 2017-08-25 (×3): 2 via ORAL
  Filled 2017-08-24 (×5): qty 2

## 2017-08-24 MED ORDER — BUPROPION HCL ER (XL) 150 MG PO TB24
150.0000 mg | ORAL_TABLET | Freq: Every day | ORAL | Status: DC
  Administered 2017-08-25 – 2017-08-26 (×2): 150 mg via ORAL
  Filled 2017-08-24 (×2): qty 1

## 2017-08-24 MED ORDER — PHENYLEPHRINE 40 MCG/ML (10ML) SYRINGE FOR IV PUSH (FOR BLOOD PRESSURE SUPPORT)
PREFILLED_SYRINGE | INTRAVENOUS | Status: DC | PRN
  Administered 2017-08-24 (×2): 120 ug via INTRAVENOUS
  Administered 2017-08-24: 200 ug via INTRAVENOUS
  Administered 2017-08-24: 160 ug via INTRAVENOUS

## 2017-08-24 MED ORDER — DEXAMETHASONE SODIUM PHOSPHATE 10 MG/ML IJ SOLN
INTRAMUSCULAR | Status: DC | PRN
  Administered 2017-08-24: 10 mg via INTRAVENOUS

## 2017-08-24 MED ORDER — MENTHOL 3 MG MT LOZG: 1.0000 | LOZENGE | OROMUCOSAL | Status: DC | PRN

## 2017-08-24 MED ORDER — HYDROMORPHONE HCL 1 MG/ML IJ SOLN
1.0000 mg | INTRAMUSCULAR | Status: DC | PRN
  Administered 2017-08-24 – 2017-08-25 (×3): 1 mg via INTRAVENOUS
  Filled 2017-08-24 (×3): qty 1

## 2017-08-24 MED ORDER — CEFAZOLIN SODIUM-DEXTROSE 2-4 GM/100ML-% IV SOLN
INTRAVENOUS | Status: AC
  Filled 2017-08-24: qty 100

## 2017-08-24 MED ORDER — PROPOFOL 500 MG/50ML IV EMUL
INTRAVENOUS | Status: DC | PRN
  Administered 2017-08-24: 50 ug/kg/min via INTRAVENOUS
  Administered 2017-08-24: 17:00:00 via INTRAVENOUS

## 2017-08-24 MED ORDER — SCOPOLAMINE 1 MG/3DAYS TD PT72
MEDICATED_PATCH | TRANSDERMAL | Status: AC
  Administered 2017-08-24: 1.5 mg via TRANSDERMAL
  Filled 2017-08-24: qty 1

## 2017-08-24 MED ORDER — METOCLOPRAMIDE HCL 5 MG/ML IJ SOLN: 5.0000 mg | Freq: Three times a day (TID) | INTRAMUSCULAR | Status: DC | PRN

## 2017-08-24 MED ORDER — BUPIVACAINE IN DEXTROSE 0.75-8.25 % IT SOLN
INTRATHECAL | Status: DC | PRN
  Administered 2017-08-24: 15 mL via INTRATHECAL

## 2017-08-24 MED ORDER — SCOPOLAMINE 1 MG/3DAYS TD PT72
1.0000 | MEDICATED_PATCH | TRANSDERMAL | Status: DC
  Administered 2017-08-24: 1.5 mg via TRANSDERMAL
  Filled 2017-08-24: qty 1

## 2017-08-24 MED ORDER — SODIUM CHLORIDE 0.9 % IV SOLN
INTRAVENOUS | Status: DC
  Administered 2017-08-24: 22:00:00 via INTRAVENOUS

## 2017-08-24 MED ORDER — ACETAMINOPHEN 650 MG RE SUPP: 650.0000 mg | RECTAL | Status: DC | PRN

## 2017-08-24 MED ORDER — LACTATED RINGERS IV SOLN
INTRAVENOUS | Status: DC
  Administered 2017-08-24 (×2): via INTRAVENOUS

## 2017-08-24 MED ORDER — HYDROMORPHONE HCL 1 MG/ML IJ SOLN
INTRAMUSCULAR | Status: AC
  Filled 2017-08-24: qty 1

## 2017-08-24 MED ORDER — CEFAZOLIN SODIUM-DEXTROSE 2-4 GM/100ML-% IV SOLN
2.0000 g | INTRAVENOUS | Status: AC
  Administered 2017-08-24: 2 g via INTRAVENOUS

## 2017-08-24 MED ORDER — PROPOFOL 10 MG/ML IV BOLUS
INTRAVENOUS | Status: DC | PRN
  Administered 2017-08-24: 20 mg via INTRAVENOUS
  Administered 2017-08-24: 30 mg via INTRAVENOUS
  Administered 2017-08-24: 20 mg via INTRAVENOUS

## 2017-08-24 MED ORDER — PHENYLEPHRINE HCL 10 MG/ML IJ SOLN
INTRAVENOUS | Status: DC | PRN
  Administered 2017-08-24: 50 ug/min via INTRAVENOUS

## 2017-08-24 MED ORDER — ALBUTEROL SULFATE (2.5 MG/3ML) 0.083% IN NEBU: 2.5000 mg | INHALATION_SOLUTION | Freq: Four times a day (QID) | RESPIRATORY_TRACT | Status: DC | PRN

## 2017-08-24 SURGICAL SUPPLY — 53 items
APL SKNCLS STERI-STRIP NONHPOA (GAUZE/BANDAGES/DRESSINGS) ×1
BENZOIN TINCTURE PRP APPL 2/3 (GAUZE/BANDAGES/DRESSINGS) ×2 IMPLANT
BLADE SAW SGTL 18X1.27X75 (BLADE) ×2 IMPLANT
CAPT HIP TOTAL 2 ×1 IMPLANT
CELLS DAT CNTRL 66122 CELL SVR (MISCELLANEOUS) ×1 IMPLANT
COVER SURGICAL LIGHT HANDLE (MISCELLANEOUS) ×2 IMPLANT
DRAPE C-ARM 42X72 X-RAY (DRAPES) ×2 IMPLANT
DRAPE STERI IOBAN 125X83 (DRAPES) ×2 IMPLANT
DRAPE U-SHAPE 47X51 STRL (DRAPES) ×6 IMPLANT
DRSG AQUACEL AG ADV 3.5X10 (GAUZE/BANDAGES/DRESSINGS) ×2 IMPLANT
DURAPREP 26ML APPLICATOR (WOUND CARE) ×2 IMPLANT
ELECT BLADE 4.0 EZ CLEAN MEGAD (MISCELLANEOUS) ×2
ELECT BLADE 6.5 EXT (BLADE) IMPLANT
ELECT REM PT RETURN 9FT ADLT (ELECTROSURGICAL) ×2
ELECTRODE BLDE 4.0 EZ CLN MEGD (MISCELLANEOUS) ×1 IMPLANT
ELECTRODE REM PT RTRN 9FT ADLT (ELECTROSURGICAL) ×1 IMPLANT
FACESHIELD WRAPAROUND (MASK) ×6 IMPLANT
FACESHIELD WRAPAROUND OR TEAM (MASK) ×2 IMPLANT
GLOVE BIOGEL PI IND STRL 8 (GLOVE) ×2 IMPLANT
GLOVE BIOGEL PI INDICATOR 8 (GLOVE) ×2
GLOVE ECLIPSE 8.0 STRL XLNG CF (GLOVE) ×2 IMPLANT
GLOVE ORTHO TXT STRL SZ7.5 (GLOVE) ×4 IMPLANT
GLOVE SURG SS PI 6.5 STRL IVOR (GLOVE) ×2 IMPLANT
GLOVE SURG SS PI 7.0 STRL IVOR (GLOVE) ×2 IMPLANT
GOWN STRL REUS W/ TWL LRG LVL3 (GOWN DISPOSABLE) ×2 IMPLANT
GOWN STRL REUS W/ TWL XL LVL3 (GOWN DISPOSABLE) ×2 IMPLANT
GOWN STRL REUS W/TWL LRG LVL3 (GOWN DISPOSABLE) ×4
GOWN STRL REUS W/TWL XL LVL3 (GOWN DISPOSABLE) ×4
HANDPIECE INTERPULSE COAX TIP (DISPOSABLE) ×2
HEAD CERAMIC DELTA 36 PLUS 1.5 (Hips) ×1 IMPLANT
KIT BASIN OR (CUSTOM PROCEDURE TRAY) ×2 IMPLANT
KIT ROOM TURNOVER OR (KITS) ×2 IMPLANT
MANIFOLD NEPTUNE II (INSTRUMENTS) ×2 IMPLANT
NS IRRIG 1000ML POUR BTL (IV SOLUTION) ×2 IMPLANT
PACK TOTAL JOINT (CUSTOM PROCEDURE TRAY) ×2 IMPLANT
PAD ARMBOARD 7.5X6 YLW CONV (MISCELLANEOUS) ×2 IMPLANT
RETRACTOR WND ALEXIS 18 MED (MISCELLANEOUS) ×1 IMPLANT
RTRCTR WOUND ALEXIS 18CM MED (MISCELLANEOUS) ×2
SET HNDPC FAN SPRY TIP SCT (DISPOSABLE) ×1 IMPLANT
STAPLER VISISTAT 35W (STAPLE) IMPLANT
STRIP CLOSURE SKIN 1/2X4 (GAUZE/BANDAGES/DRESSINGS) ×4 IMPLANT
SUT ETHIBOND NAB CT1 #1 30IN (SUTURE) ×3 IMPLANT
SUT MNCRL AB 4-0 PS2 18 (SUTURE) ×2 IMPLANT
SUT VIC AB 0 CT1 27 (SUTURE) ×2
SUT VIC AB 0 CT1 27XBRD ANBCTR (SUTURE) ×1 IMPLANT
SUT VIC AB 1 CT1 27 (SUTURE) ×2
SUT VIC AB 1 CT1 27XBRD ANBCTR (SUTURE) ×1 IMPLANT
SUT VIC AB 2-0 CT1 27 (SUTURE) ×2
SUT VIC AB 2-0 CT1 TAPERPNT 27 (SUTURE) ×1 IMPLANT
TOWEL OR 17X24 6PK STRL BLUE (TOWEL DISPOSABLE) ×2 IMPLANT
TOWEL OR 17X26 10 PK STRL BLUE (TOWEL DISPOSABLE) ×2 IMPLANT
TRAY FOLEY W/METER SILVER 16FR (SET/KITS/TRAYS/PACK) ×1 IMPLANT
WATER STERILE IRR 1000ML POUR (IV SOLUTION) ×4 IMPLANT

## 2017-08-24 NOTE — Progress Notes (Signed)
1800 Received pt from PACU, A&O x4. Left hip dressing dry and intact. In pain when she came in, medicated.

## 2017-08-24 NOTE — Brief Op Note (Signed)
08/24/2017  4:49 PM  PATIENT:  Stephanie Andrade  62 y.o. female  PRE-OPERATIVE DIAGNOSIS:  left hip osteoarthritis  POST-OPERATIVE DIAGNOSIS:  left hip osteoarthritis  PROCEDURE:  Procedure(s): LEFT TOTAL HIP ARTHROPLASTY ANTERIOR APPROACH (Left)  SURGEON:  Surgeon(s) and Role:    Mcarthur Rossetti, MD - Primary  PHYSICIAN ASSISTANT: Benita Stabile, PA-C  ANESTHESIA:   spinal  EBL:  500 mL   COUNTS:  YES  DICTATION: .Other Dictation: Dictation Number [id number]  PLAN OF CARE: Admit to inpatient   PATIENT DISPOSITION:  PACU - hemodynamically stable.   Delay start of Pharmacological VTE agent (>24hrs) due to surgical blood loss or risk of bleeding: no

## 2017-08-24 NOTE — H&P (Signed)
TOTAL HIP ADMISSION H&P  Patient is admitted for left total hip arthroplasty.  Subjective:  Chief Complaint: left hip pain  HPI: Stephanie Andrade, 62 y.o. female, has a history of pain and functional disability in the left hip(s) due to arthritis and patient has failed non-surgical conservative treatments for greater than 12 weeks to include NSAID's and/or analgesics, corticosteriod injections, flexibility and strengthening excercises, use of assistive devices, weight reduction as appropriate and activity modification.  Onset of symptoms was gradual starting 2 years ago with gradually worsening course since that time.The patient noted no past surgery on the left hip(s).  Patient currently rates pain in the left hip at 10 out of 10 with activity. Patient has night pain, worsening of pain with activity and weight bearing, pain that interfers with activities of daily living, pain with passive range of motion and crepitus. Patient has evidence of subchondral cysts, subchondral sclerosis, periarticular osteophytes and joint space narrowing by imaging studies. This condition presents safety issues increasing the risk of falls.  There is no current active infection.  Patient Active Problem List   Diagnosis Date Noted  . Unilateral primary osteoarthritis, left hip 11/30/2016  . Biliary obstruction 09/20/2016  . Cholelithiases 09/20/2016  . Status post total replacement of right hip 03/24/2016  . Osteoarthritis of left hip 11/26/2015  . Impaired glucose tolerance 12/09/2014  . Plantar fascia rupture 06/16/2012  . History of vitamin D deficiency 11/15/2011  . History of sarcoidosis 11/15/2011  . Hot flashes not due to menopause 07/19/2011  . Spinal stenosis, lumbar region, with neurogenic claudication 03/10/2011  . Hypertension 02/11/2011  . Asthma 02/11/2011  . Morbid obesity ([place]) 02/11/2011  . Depression 02/11/2011  . Cervical radiculopathy 12/04/2010  . SHOULDER PAIN, BILATERAL 10/01/2010  .  WRIST PAIN, BILATERAL 10/01/2010  . LUMBAGO 10/01/2010  . Osteoarthritis of right hip 07/17/2009  . GREATER TROCHANTERIC BURSITIS 07/17/2009   Past Medical History:  Diagnosis Date  . Arthritis   . Asthma   . Depression   . Family history of adverse reaction to anesthesia    sister, brother - PONV  . Family history of ovarian cancer   . Fibroids   . GERD (gastroesophageal reflux disease)    occ  . History of anemia   . History of bronchitis   . History of pneumonia   . History of sarcoidosis   . Hypertension   . Pneumonia    hx  . PONV (postoperative nausea and vomiting) 03/26/2016  . Wears glasses     Past Surgical History:  Procedure Laterality Date  . CHOLECYSTECTOMY N/A 09/22/2016   Procedure: LAPAROSCOPIC CHOLECYSTECTOMY WITH INTRAOPERATIVE CHOLANGIOGRAM;  Surgeon: Erroll Luna, MD;  Location: [facility];  Service: General;  Laterality: N/A;  . COLONOSCOPY    . ESOPHAGOGASTRODUODENOSCOPY    . JOINT REPLACEMENT    . 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   Procedure: RIGHT TOTAL HIP ARTHROPLASTY ANTERIOR APPROACH;  Surgeon: Mcarthur Rossetti, MD;  Location: [facility];  Service: Orthopedics;  Laterality: Right;    Current Facility-Administered Medications  Medication Dose Route Frequency Provider Last Rate Last Dose  . tranexamic acid (CYKLOKAPRON) 1,000 mg in sodium chloride 0.9 % 100 mL IVPB  1,000 mg Intravenous To OR Mcarthur Rossetti, MD       Allergies  Allergen Reactions  . Mushroom Extract Complex Anaphylaxis and Hives  . Codeine Nausea Only  . Dextromethorphan Nausea And Vomiting  . Levaquin [Levofloxacin In D5w] Other (See Comments)    Myalgias  .  Macrobid  [Nitrofurantoin Macrocrystal] Nausea And Vomiting  . Tape Itching and Other (See Comments)    Please use paper tape  . Tequila Sunrise Flavor Nausea And Vomiting  . Tessalon Perles Nausea And Vomiting    Social History   Tobacco Use  . Smoking status: Former Smoker    Types: Cigarettes     Last attempt to quit: 06/20/2009    Years since quitting: 8.1  . Smokeless tobacco: Never Used  . Tobacco comment: "smoked 2 weeks out of the year for about 15 years"   Substance Use Topics  . Alcohol use: Yes    Comment: socially    Family History  Problem Relation Age of Onset  . Cancer Mother   . Arthritis Father   . Hypertension Father   . Arthritis Sister   . Hypertension Brother      Review of Systems  Musculoskeletal: Positive for joint pain.  All other systems reviewed and are negative.   Objective:  Physical Exam  Constitutional: She is oriented to person, place, and time. She appears well-developed and well-nourished.  HENT:  Head: Normocephalic and atraumatic.  Eyes: EOM are normal. Pupils are equal, round, and reactive to light.  Neck: Normal range of motion. Neck supple.  Cardiovascular: Normal rate and regular rhythm.  Respiratory: Effort normal and breath sounds normal.  GI: Soft. Bowel sounds are normal.  Musculoskeletal:       Left hip: She exhibits decreased range of motion, decreased strength, tenderness and bony tenderness.  Neurological: She is alert and oriented to person, place, and time.  Skin: Skin is warm and dry.  Psychiatric: She has a normal mood and affect.    Vital signs in last 24 hours:    Labs:   Estimated body mass index is 40.19 kg/m as calculated from the following:   Height as of 08/13/17: 5\' 7"  (1.702 m).   Weight as of 08/13/17: 256 lb 9.9 oz (116.4 kg).   Imaging Review Plain radiographs demonstrate severe degenerative joint disease of the left hip(s). The bone quality appears to be excellent for age and reported activity level.  Assessment/Plan:  End stage arthritis, left hip(s)  The patient history, physical examination, clinical judgement of the provider and imaging studies are consistent with end stage degenerative joint disease of the left hip(s) and total hip arthroplasty is deemed medically necessary. The  treatment options including medical management, injection therapy, arthroscopy and arthroplasty were discussed at length. The risks and benefits of total hip arthroplasty were presented and reviewed. The risks due to aseptic loosening, infection, stiffness, dislocation/subluxation,  thromboembolic complications and other imponderables were discussed.  The patient acknowledged the explanation, agreed to proceed with the plan and consent was signed. Patient is being admitted for inpatient treatment for surgery, pain control, PT, OT, prophylactic antibiotics, VTE prophylaxis, progressive ambulation and ADL's and discharge planning.The patient is planning to be discharged home with home health services

## 2017-08-24 NOTE — Transfer of Care (Signed)
Immediate Anesthesia Transfer of Care Note  Patient: Stephanie Andrade  Procedure(s) Performed: LEFT TOTAL HIP ARTHROPLASTY ANTERIOR APPROACH (Left Hip)  Patient Location: PACU  Anesthesia Type:MAC and Spinal  Level of Consciousness: awake and patient cooperative  Airway & Oxygen Therapy: Patient Spontanous Breathing  Post-op Assessment: Report given to RN and Post -op Vital signs reviewed and stable  Post vital signs: Reviewed and stable  Last Vitals:  Vitals:   08/24/17 1223 08/24/17 1701  BP: (!) 145/75   Pulse: 83   Resp:    Temp:  (!) 36.1 C  SpO2:      Last Pain:  Vitals:   08/24/17 1701  TempSrc:   PainSc: (P) 0-No pain      Patients Stated Pain Goal: 3 (46/21/94 7125)  Complications: No apparent anesthesia complications

## 2017-08-24 NOTE — Anesthesia Postprocedure Evaluation (Signed)
Anesthesia Post Note  Patient: Stephanie Andrade  Procedure(s) Performed: LEFT TOTAL HIP ARTHROPLASTY ANTERIOR APPROACH (Left Hip)     Patient location during evaluation: PACU Anesthesia Type: Spinal Level of consciousness: awake and alert Pain management: pain level controlled Vital Signs Assessment: post-procedure vital signs reviewed and stable Respiratory status: spontaneous breathing and respiratory function stable Cardiovascular status: blood pressure returned to baseline and stable Postop Assessment: spinal receding Anesthetic complications: no    Last Vitals:  Vitals:   08/24/17 1812 08/24/17 2018  BP: (!) 118/58 (!) 117/58  Pulse: 77 77  Resp:  14  Temp: 37 C 36.8 C  SpO2: 98% 95%    Last Pain:  Vitals:   08/24/17 2018  TempSrc: Oral  PainSc:                  Sheketa Ende DANIEL

## 2017-08-24 NOTE — Anesthesia Procedure Notes (Signed)
Procedure Name: MAC Date/Time: 08/24/2017 3:00 PM Performed by: Lance Coon, CRNA Pre-anesthesia Checklist: Patient identified, Emergency Drugs available, Suction available, Patient being monitored and Timeout performed Patient Re-evaluated:Patient Re-evaluated prior to induction Oxygen Delivery Method: Nasal cannula

## 2017-08-24 NOTE — Anesthesia Preprocedure Evaluation (Addendum)
Anesthesia Evaluation  Patient identified by MRN, date of birth, ID band Patient awake    Reviewed: Allergy & Precautions, H&P , NPO status , Patient's Chart, lab work & pertinent test results  History of Anesthesia Complications (+) PONV  Airway Mallampati: II  TM Distance: >3 FB Neck ROM: Full    Dental no notable dental hx. (+) Teeth Intact, Dental Advisory Given   Pulmonary asthma , former smoker,    Pulmonary exam normal breath sounds clear to auscultation       Cardiovascular hypertension, Pt. on medications  Rhythm:Regular Rate:Normal     Neuro/Psych Depression negative neurological ROS     GI/Hepatic Neg liver ROS, GERD  ,  Endo/Other  Morbid obesity  Renal/GU negative Renal ROS  negative genitourinary   Musculoskeletal  (+) Arthritis , Osteoarthritis,    Abdominal   Peds  Hematology negative hematology ROS (+)   Anesthesia Other Findings   Reproductive/Obstetrics negative OB ROS                            Anesthesia Physical Anesthesia Plan  ASA: III  Anesthesia Plan: Spinal   Post-op Pain Management:    Induction: Intravenous  PONV Risk Score and Plan: 4 or greater and Ondansetron, Dexamethasone, Propofol infusion and Scopolamine patch - Pre-op  Airway Management Planned: Mask  Additional Equipment:   Intra-op Plan:   Post-operative Plan:   Informed Consent: I have reviewed the patients History and Physical, chart, labs and discussed the procedure including the risks, benefits and alternatives for the proposed anesthesia with the patient or authorized representative who has indicated his/her understanding and acceptance.   Dental advisory given  Plan Discussed with: CRNA  Anesthesia Plan Comments:        Anesthesia Quick Evaluation

## 2017-08-24 NOTE — Anesthesia Procedure Notes (Signed)
Spinal  Patient location during procedure: OR Start time: 08/24/2017 2:54 PM End time: 08/24/2017 3:04 PM Staffing Anesthesiologist: Duane Boston, MD Performed: anesthesiologist  Preanesthetic Checklist Completed: patient identified, surgical consent, pre-op evaluation, timeout performed, IV checked, risks and benefits discussed and monitors and equipment checked Spinal Block Patient position: sitting Prep: DuraPrep Patient monitoring: cardiac monitor, continuous pulse ox and blood pressure Approach: midline Location: L2-3 Injection technique: single-shot Needle Needle type: Pencan  Needle gauge: 24 G Needle length: 9 cm Additional Notes Functioning IV was confirmed and monitors were applied. Sterile prep and drape, including hand hygiene and sterile gloves were used. The patient was positioned and the spine was prepped. The skin was anesthetized with lidocaine.  Free flow of clear CSF was obtained prior to injecting local anesthetic into the CSF.  The spinal needle aspirated freely following injection.  The needle was carefully withdrawn.  The patient tolerated the procedure well.

## 2017-08-24 NOTE — Progress Notes (Signed)
Orthopedic Tech Progress Note Patient Details:  Stephanie Andrade 1955/11/18 [id number]  Patient ID: Stephanie Andrade, female   DOB: 10-20-1955, 62 y.o.   MRN: [id number] OHF applied.  Kristopher Oppenheim 08/24/2017, 8:22 PM

## 2017-08-25 ENCOUNTER — Encounter (HOSPITAL_COMMUNITY): Payer: Self-pay | Admitting: General Practice

## 2017-08-25 ENCOUNTER — Other Ambulatory Visit: Payer: Self-pay

## 2017-08-25 LAB — CBC
HCT: 31.1 % — ABNORMAL LOW (ref 36.0–46.0)
Hemoglobin: 9.9 g/dL — ABNORMAL LOW (ref 12.0–15.0)
MCH: 25.8 pg — ABNORMAL LOW (ref 26.0–34.0)
MCHC: 31.8 g/dL (ref 30.0–36.0)
MCV: 81 fL (ref 78.0–100.0)
Platelets: 224 10*3/uL (ref 150–400)
RBC: 3.84 MIL/uL — ABNORMAL LOW (ref 3.87–5.11)
RDW: 13.1 % (ref 11.5–15.5)
WBC: 12 10*3/uL — ABNORMAL HIGH (ref 4.0–10.5)

## 2017-08-25 LAB — BASIC METABOLIC PANEL
Anion gap: 8 (ref 5–15)
BUN: 16 mg/dL (ref 6–20)
CO2: 26 mmol/L (ref 22–32)
Calcium: 8.9 mg/dL (ref 8.9–10.3)
Chloride: 102 mmol/L (ref 101–111)
Creatinine, Ser: 0.7 mg/dL (ref 0.44–1.00)
GFR calc Af Amer: >60 mL/min (ref >60)
GFR calc non Af Amer: >60 mL/min (ref >60)
Glucose, Bld: 154 mg/dL — ABNORMAL HIGH (ref 65–99)
Potassium: 4.9 mmol/L (ref 3.5–5.1)
Sodium: 136 mmol/L (ref 135–145)

## 2017-08-25 NOTE — Evaluation (Signed)
Occupational Therapy Evaluation and Discharge Patient Details Name: Stephanie Andrade MRN: [id number] DOB: 1955-10-17 Today's Date: 08/25/2017    History of Present Illness 61 y.o. female s/p L THA. PMH includes: PONV, GERD, HTN, R THA.   Clinical Impression   Pt reports she was independent with ADL PTA. Currently pt requires supervision for functional mobility and ADL with the exception of min assist for LB ADL. All hip, safety, and ADL education completed with pt. Pt planning to d/c home with 24/7 supervision from family. No further acute OT needs identified; signing off at this time. Please re-consult if needs change. Thank you for this referral.    Follow Up Recommendations  No OT follow up;Supervision/Assistance - 24 hour    Equipment Recommendations  None recommended by OT    Recommendations for Other Services       Precautions / Restrictions Precautions Precautions: Fall Restrictions Weight Bearing Restrictions: Yes LLE Weight Bearing: Weight bearing as tolerated      Mobility Bed Mobility Overal bed mobility: Needs Assistance Bed Mobility: Supine to Sit     Supine to sit: Supervision;HOB elevated     General bed mobility comments: Increased time and effort with use of bed rail and HOB elevated  Transfers Overall transfer level: Needs assistance Equipment used: Rolling walker (2 wheeled) Transfers: Sit to/from Stand Sit to Stand: Supervision         General transfer comment: Increased time and effort. Good hand placement and technique.    Balance Overall balance assessment: Needs assistance Sitting-balance support: Feet supported Sitting balance-Leahy Scale: Good     Standing balance support: No upper extremity supported;During functional activity Standing balance-Leahy Scale: Fair Standing balance comment: Able to stand at sink and wash hands without UE support                           ADL either performed or assessed with clinical  judgement   ADL Overall ADL's : Needs assistance/impaired Eating/Feeding: Independent;Sitting   Grooming: Supervision/safety;Standing;Wash/dry hands   Upper Body Bathing: Set up;Sitting   Lower Body Bathing: Minimal assistance;Sit to/from stand   Upper Body Dressing : Set up;Sitting   Lower Body Dressing: Minimal assistance;Sit to/from stand Lower Body Dressing Details (indicate cue type and reason): Educated on compensatory strategies for LB ADL. Sister to assist with ADL as needed Toilet Transfer: Supervision/safety;Ambulation;Comfort height toilet;Grab bars;RW   Toileting- Clothing Manipulation and Hygiene: Supervision/safety;Sit to/from Nurse, children's Details (indicate cue type and reason): Discussed tub transfer technique; pt reports she uses grab bars and sister is available to assist. Pt reports she feels comfortable with transfer technique and does not care to practice while in hospital. Functional mobility during ADLs: Supervision/safety;Rolling walker       Vision         Perception     Praxis      Pertinent Vitals/Pain Pain Assessment: 0-10 Pain Score: 6  Pain Location: L hip Pain Descriptors / Indicators: Discomfort;Aching;Grimacing Pain Intervention(s): Monitored during session;Limited activity within patient's tolerance;Repositioned;Premedicated before session;Ice applied     Hand Dominance     Extremity/Trunk Assessment Upper Extremity Assessment Upper Extremity Assessment: Overall WFL for tasks assessed   Lower Extremity Assessment Lower Extremity Assessment: Defer to PT evaluation       Communication Communication Communication: No difficulties   Cognition Arousal/Alertness: Awake/alert Behavior During Therapy: WFL for tasks assessed/performed Overall Cognitive Status: Within Functional Limits for tasks assessed  General Comments       Exercises     Shoulder Instructions       Home Living Family/patient expects to be discharged to:: Private residence Living Arrangements: Spouse/significant other Available Help at Discharge: Family;Available 24 hours/day Type of Home: House Home Access: Stairs to enter [employer] of Steps: 2  Entrance Stairs-Rails: Right Home Layout: Two level;Bed/bath upstairs;1/2 bath on main level     Bathroom Shower/Tub: Tub/shower unit;Curtain   Bathroom Toilet: Standard     Home Equipment: Environmental consultant - 2 wheels;Bedside commode;Grab bars - toilet;Grab bars - tub/shower          Prior Functioning/Environment Level of Independence: Independent with assistive device(s)        Comments: walking with cane and RW before surgery        OT Problem List:        OT Treatment/Interventions:      OT Goals(Current goals can be found in the care plan section) Acute Rehab OT Goals Patient Stated Goal: return home OT Goal Formulation: All assessment and education complete, DC therapy  OT Frequency:     Barriers to D/C:            Co-evaluation              AM-PAC PT "6 Clicks" Daily Activity     Outcome Measure Help from another person eating meals?: None Help from another person taking care of personal grooming?: A Little Help from another person toileting, which includes using toliet, bedpan, or urinal?: A Little Help from another person bathing (including washing, rinsing, drying)?: A Little Help from another person to put on and taking off regular upper body clothing?: None Help from another person to put on and taking off regular lower body clothing?: A Little 6 Click Score: 20   End of Session Equipment Utilized During Treatment: Rolling walker  Activity Tolerance: Patient tolerated treatment well Patient left: in chair;with call bell/phone within reach;with family/visitor present  OT Visit Diagnosis: Other abnormalities of gait and mobility (R26.89);Pain Pain - Right/Left: Left Pain - part of  body: Hip                Time: 4782-9562 OT Time Calculation (min): 20 min Charges:  OT General Charges $OT Visit: 1 Visit OT Evaluation $OT Eval Moderate Complexity: 1 Mod G-Codes:     Khylin Gutridge A. Brett Albino, M.S., OTR/L Pager: [phone]  Gaye Alken 08/25/2017, 4:09 PM

## 2017-08-25 NOTE — Care Management Note (Signed)
Case Management Note  Patient Details  Name: Stephanie Andrade MRN: [id number] Date of Birth: [birth year]  Subjective/Objective:                    Action/Plan:  Patient already has walker and 3 in 1 from previous hip surgery  Expected Discharge Date:                  Expected Discharge Plan:  [place]  In-House Referral:     Discharge planning Services  CM Consult  Post Acute Care Choice:  Durable Medical Equipment, Home Health Choice offered to:  Patient, Spouse  DME Arranged:  N/A DME Agency:  NA  HH Arranged:  PT [place] Agency:  [facility]  Status of Service:  Completed, signed off  If discussed at [employer], dates discussed:    Additional Comments:  Marilu Favre, RN 08/25/2017, 10:41 AM

## 2017-08-25 NOTE — Evaluation (Signed)
Physical Therapy Evaluation Patient Details Name: Stephanie Andrade MRN: [id number] DOB: [birth year] Today's Date: 08/25/2017   History of Present Illness  62 y.o. female s/p L THA. PMH includes: PONV, GERD, HTN, R THA.  Clinical Impression  Patient is s/p above surgery resulting in functional limitations due to the deficits listed below (see PT Problem List). PTA, pt was mod I with mobility living with husband in 2 story home with stairs to enter. Upon eval, patient presents with moderate post op pain and weakness in LLE that limits her mobility, as well as nausea. Currently min guard level for OOB mobility and able to ambulate in hallway short distances. Plan to improve gait mechanics and activity tolerance next visit, and focus on stair training tomorrow 1/10.  Patient will benefit from skilled PT to increase their independence and safety with mobility to allow discharge to the venue listed below.       Follow Up Recommendations Home health PT;Supervision for mobility/OOB;DC plan and follow up therapy as arranged by surgeon    Equipment Recommendations  None recommended by PT    Recommendations for Other Services       Precautions / Restrictions Precautions Precautions: Fall Restrictions Weight Bearing Restrictions: Yes LLE Weight Bearing: Weight bearing as tolerated      Mobility  Bed Mobility Overal bed mobility: Needs Assistance Bed Mobility: Supine to Sit     Supine to sit: Supervision     General bed mobility comments: Supervision for safety, pt supine to sit with elevated HOB and RLE to scoop LLE.   Transfers Overall transfer level: Needs assistance Equipment used: Rolling walker (2 wheeled) Transfers: Sit to/from Stand Sit to Stand: Min assist;Min guard         General transfer comment: Min A to help power up from elevated surface into RW. Cues for hand placement and safety  Ambulation/Gait Ambulation/Gait assistance: Min guard Ambulation Distance (Feet): 75  Feet Assistive device: Rolling walker (2 wheeled) Gait Pattern/deviations: Step-through pattern;Step-to pattern;Antalgic;Decreased step length - right;Decreased stance time - left Gait velocity: decreasd   General Gait Details: Cues for sequencing, step length ,heel strike. Patient ambulating slowly with increased levels of pain. Min Guard for safety.  Stairs            Wheelchair Mobility    Modified Rankin (Stroke Patients Only)       Balance Overall balance assessment: Needs assistance Sitting-balance support: Feet unsupported;Bilateral upper extremity supported Sitting balance-Leahy Scale: Good     Standing balance support: During functional activity;Bilateral upper extremity supported Standing balance-Leahy Scale: Poor Standing balance comment: Reliant on BUE support for balance at this time.                             Pertinent Vitals/Pain Pain Assessment: 0-10 Pain Score: 7  Pain Location: L Hip Pain Descriptors / Indicators: Discomfort;Grimacing;Operative site guarding Pain Intervention(s): Limited activity within patient's tolerance;Monitored during session;Premedicated before session    Home Living Family/patient expects to be discharged to:: Private residence Living Arrangements: Spouse/significant other Available Help at Discharge: Family;Available 24 hours/day(husband and sister) Type of Home: House Home Access: Stairs to enter Entrance Stairs-Rails: Right Entrance Stairs-Number of Steps: 2  Home Layout: Two level Home Equipment: Walker - 2 wheels;Bedside commode      Prior Function Level of Independence: Independent with assistive device(s)         Comments: walking with cane and RW before surgery  Hand Dominance        Extremity/Trunk Assessment   Upper Extremity Assessment Upper Extremity Assessment: Overall WFL for tasks assessed;Defer to OT evaluation    Lower Extremity Assessment Lower Extremity Assessment:  (Gross Strength: RLE 4-/5 LLE: 3-/5 post op pain)       Communication   Communication: No difficulties  Cognition Arousal/Alertness: Awake/alert Behavior During Therapy: WFL for tasks assessed/performed Overall Cognitive Status: Within Functional Limits for tasks assessed                                        General Comments General comments (skin integrity, edema, etc.): SpO2=94% HR:89 after activity. Patient reports some nausea after mobility, RN notified.     Exercises Total Joint Exercises Ankle Circles/Pumps: AROM;Both;20 reps Quad Sets: AROM;Both;10 reps   Assessment/Plan    PT Assessment Patient needs continued PT services  PT Problem List Decreased strength;Decreased range of motion;Decreased activity tolerance;Decreased balance;Decreased mobility;Pain       PT Treatment Interventions DME instruction;Gait training;Stair training;Functional mobility training;Therapeutic exercise;Therapeutic activities    PT Goals (Current goals can be found in the Care Plan section)  Acute Rehab PT Goals Patient Stated Goal: return home PT Goal Formulation: With patient/family Time For Goal Achievement: 09/01/17 Potential to Achieve Goals: Good    Frequency 7X/week   Barriers to discharge        Co-evaluation               AM-PAC PT "6 Clicks" Daily Activity  Outcome Measure Difficulty turning over in bed (including adjusting bedclothes, sheets and blankets)?: A Lot Difficulty moving from lying on back to sitting on the side of the bed? : A Lot Difficulty sitting down on and standing up from a chair with arms (e.g., wheelchair, bedside commode, etc,.)?: A Lot Help needed moving to and from a bed to chair (including a wheelchair)?: A Little Help needed walking in hospital room?: A Little Help needed climbing 3-5 steps with a railing? : A Lot 6 Click Score: 14    End of Session Equipment Utilized During Treatment: Gait belt Activity Tolerance:  Patient limited by pain Patient left: in bed;with call bell/phone within reach;with nursing/sitter in room;with family/visitor present Nurse Communication: Mobility status;Patient requests pain meds PT Visit Diagnosis: Unsteadiness on feet (R26.81);Other abnormalities of gait and mobility (R26.89);Muscle weakness (generalized) (M62.81);Pain Pain - Right/Left: Left Pain - part of body: Hip    Time: 1610-9604 PT Time Calculation (min) (ACUTE ONLY): 29 min   Charges:   PT Evaluation $PT Eval Low Complexity: 1 Low PT Treatments $Gait Training: 8-22 mins   PT G Codes:        Etta Grandchild, PT, DPT Acute Rehab Services Pager: [phone]    Etta Grandchild 08/25/2017, 11:38 AM

## 2017-08-25 NOTE — Progress Notes (Signed)
Physical Therapy Treatment Patient Details Name: Stephanie Andrade MRN: [id number] DOB: 1956/08/15 Today's Date: 08/25/2017    History of Present Illness 62 y.o. female s/p L THA. PMH includes: PONV, GERD, HTN, R THA.    PT Comments    PM session focused on introducing new therex, and progressing ambulation. Pt demonstrating improved gait mechanics this visit, and slightly increased activity tolerance. Discussed with patient plan for tomorrow to focus on stairs extensively, likely requiring BID treatment.    Follow Up Recommendations  Home health PT;Supervision for mobility/OOB;DC plan and follow up therapy as arranged by surgeon     Equipment Recommendations  None recommended by PT    Recommendations for Other Services       Precautions / Restrictions Precautions Precautions: Fall Restrictions Weight Bearing Restrictions: Yes LLE Weight Bearing: Weight bearing as tolerated    Mobility  Bed Mobility Overal bed mobility: Needs Assistance Bed Mobility: Supine to Sit     Supine to sit: Supervision;HOB elevated     General bed mobility comments: Increased time and effort with use of bed rail and HOB elevated  Transfers Overall transfer level: Needs assistance Equipment used: Rolling walker (2 wheeled) Transfers: Sit to/from Stand Sit to Stand: Supervision         General transfer comment: Increased time and effort. Good hand placement and technique.  Ambulation/Gait Ambulation/Gait assistance: Min guard;Supervision Ambulation Distance (Feet): 125 Feet Assistive device: Rolling walker (2 wheeled) Gait Pattern/deviations: Step-through pattern;Step-to pattern;Antalgic;Decreased step length - right;Decreased stance time - left     General Gait Details: Improved mechanics, progressed to superivsion. Now step through gait 75% of the time.    Stairs            Wheelchair Mobility    Modified Rankin (Stroke Patients Only)       Balance Overall balance  assessment: Needs assistance Sitting-balance support: Feet supported Sitting balance-Leahy Scale: Good     Standing balance support: No upper extremity supported;During functional activity Standing balance-Leahy Scale: Fair Standing balance comment: Able to stand at sink and wash hands without UE support                            Cognition Arousal/Alertness: Awake/alert Behavior During Therapy: WFL for tasks assessed/performed Overall Cognitive Status: Within Functional Limits for tasks assessed                                        Exercises Total Joint Exercises Ankle Circles/Pumps: AROM;Both;20 reps Quad Sets: AROM;Both;10 reps Hip ABduction/ADduction: AROM;Left;10 reps;Standing Knee Flexion: AROM;10 reps;Standing;Left Marching in Standing: AROM;Left;10 reps Standing Hip Extension: AROM;Left;10 reps    General Comments        Pertinent Vitals/Pain Pain Assessment: 0-10 Pain Score: 6  Pain Location: L hip Pain Descriptors / Indicators: Discomfort;Aching;Grimacing Pain Intervention(s): Limited activity within patient's tolerance;Monitored during session    Home Living Family/patient expects to be discharged to:: Private residence Living Arrangements: Spouse/significant other Available Help at Discharge: Family;Available 24 hours/day Type of Home: House Home Access: Stairs to enter Entrance Stairs-Rails: Right Home Layout: Two level;Bed/bath upstairs;1/2 bath on main level Home Equipment: Walker - 2 wheels;Bedside commode;Grab bars - toilet;Grab bars - tub/shower      Prior Function Level of Independence: Independent with assistive device(s)      Comments: walking with cane and RW before surgery   PT  Goals (current goals can now be found in the care plan section) Acute Rehab PT Goals Patient Stated Goal: return home PT Goal Formulation: With patient/family Time For Goal Achievement: 09/01/17 Potential to Achieve Goals:  Good Progress towards PT goals: Progressing toward goals    Frequency    7X/week      PT Plan Current plan remains appropriate    Co-evaluation              AM-PAC PT "6 Clicks" Daily Activity  Outcome Measure  Difficulty turning over in bed (including adjusting bedclothes, sheets and blankets)?: A Lot Difficulty moving from lying on back to sitting on the side of the bed? : A Lot Difficulty sitting down on and standing up from a chair with arms (e.g., wheelchair, bedside commode, etc,.)?: A Lot Help needed moving to and from a bed to chair (including a wheelchair)?: A Little Help needed walking in hospital room?: A Little Help needed climbing 3-5 steps with a railing? : A Lot 6 Click Score: 14    End of Session Equipment Utilized During Treatment: Gait belt Activity Tolerance: Patient limited by pain Patient left: in bed;with call bell/phone within reach;with nursing/sitter in room;with family/visitor present Nurse Communication: Mobility status;Patient requests pain meds PT Visit Diagnosis: Unsteadiness on feet (R26.81);Other abnormalities of gait and mobility (R26.89);Muscle weakness (generalized) (M62.81);Pain Pain - Right/Left: Left Pain - part of body: Hip     Time: 2440-1027 PT Time Calculation (min) (ACUTE ONLY): 33 min  Charges:  $Gait Training: 8-22 mins $Therapeutic Exercise: 8-22 mins                    G Codes:       Etta Grandchild, PT, DPT Acute Rehab Services Pager: [phone]     Etta Grandchild 08/25/2017, 5:14 PM

## 2017-08-25 NOTE — Op Note (Signed)
NAME:  Stephanie Andrade, Stephanie Andrade                  ACCOUNT NO.:  MEDICAL RECORD NO.:  [id number]  LOCATION:                                 FACILITY:  PHYSICIAN:  Lind Guest. Ninfa Linden, M.D.DATE OF BIRTH:  DATE OF PROCEDURE:  08/24/2017 DATE OF DISCHARGE:                              OPERATIVE REPORT   PREOPERATIVE DIAGNOSIS:  Primary osteoarthritis and degenerative joint disease, left hip.  POSTOPERATIVE DIAGNOSIS:  Primary osteoarthritis and degenerative joint disease, left hip.  PROCEDURE:  Left total hip arthroplasty through direct anterior approach.  IMPLANTS:  DePuy Sector Gription acetabular component size 54, size 36 +0 polyethylene liner, size 12 Corail femoral component with standard offset, size 36 +5 ceramic hip ball.  SURGEON:  Lind Guest. Ninfa Linden, M.D.  ASSISTANT:  Erskine Emery, PA-C.  ANESTHESIA:  Spinal.  ANTIBIOTICS:  IV Ancef 2 g.  BLOOD LOSS:  500 mL.  COMPLICATIONS:  None.  INDICATIONS:  [name] is a very pleasant 62 year old female, well known to me.  She has debilitating arthritis involving her left hip.  We have seen this hip for some time now.  She has actually had a successful right total hip arthroplasty in 2017 and now wished to have this done on left side.  Her pain is daily and it is 10/10.  Her arthritis is severe on x-ray as well.  She has tried and failed all forms of conservative treatment.  At this point, she does wish to proceed with surgery.  PROCEDURE DESCRIPTION:  After informed consent was obtained, appropriate left hip was marked.  She was brought to the operating room where spinal anesthesia was obtained.  She was then laid supine on a stretcher. Foley catheter was placed and both feet had traction boots applied to them.  Of note, she was clinically short on her left operative side preoperative.  Her left hip was prepped and draped with DuraPrep and sterile drapes.  A time-out was called and she was identified as correct patient  and correct left hip.  We then made an incision just inferior and posterior to the anterior superior iliac spine and carried this obliquely down the leg.  We dissected down to tensor fascia lata muscle. The tensor fascia was then divided longitudinally to proceed with a direct anterior approach to the hip.  We identified and cauterized circumflex vessels and then identified the hip capsule.  Noted the hip capsule in an L-type format, finding a moderate joint effusion and significant arthritis of her left hip.  We placed Cobra retractors on the medial and lateral femoral neck and then made our femoral neck cut with an oscillating saw proximal to the lesser trochanter and completed this on osteotome.  I placed a corkscrew guide in the femoral head and removed the femoral head in its entirety and found to be devoid of cartilage.  We placed a bent Hohmann over the medial acetabular rim and then cleaned remnants of acetabular labrum and other debris.  We then began reaming under direct visualization from a size 42 reamer in stepwise increments going up to a size 54 with all reamers under direct visualization, the last reamer under direct fluoroscopy, so we could obtain  our depth of reaming, our inclination, and anteversion.  Once we were pleased with this, we placed a real DePuy Sector Gription acetabular component size 54 and a 36 +0 polyethylene liner, which was neutral for a size 54 acetabular component.  Attention was then turned to the femur.  With the leg externally rotated to 120 degrees extended and adducted, we were able to place a Mueller retractor medially and a Hohmann retractor behind the greater trochanter.  We released lateral joint capsule and used a box cutting osteotome to enter the femoral canal and a rongeur to lateralize.  We then began broaching from a size 8 broach using Corail broaching system going up to a size 12.  With a size 12 in place, we trialed a standard offset  femoral neck and 36 +1.5 hip ball.  We brought the leg back over and up with traction and internal rotation reducing the pelvis, and we felt there was just a little bit too much shuck and that she needed just a little bit more offset and leg length.  We dislocated the hip and removed the trial components.  We were able to place the real Corail femoral component with standard offset size 12 and a real 36 +5 hip ball, reduced this in the acetabulum, and we felt it was stable and her leg lengths were near equal as well as offset.  She had good range of motion as well.  We assessed this all under direct fluoroscopy.  We then removed all instrumentation and irrigated the soft tissue with normal saline solution.  We were able to close the joint capsule with interrupted #1 Ethibond suture, followed by running #1 Vicryl in tensor fascia, 0 Vicryl in the deep tissue, 2-0 Vicryl in the subcutaneous tissue, 4-0 Monocryl subcuticular stitch and Steri-Strips on the skin.  An Aquacel dressing was applied.  She was taken off the Hana table and taken to the recovery room in stable condition.  All final counts were correct. There were no complications noted.     Lind Guest. Ninfa Linden, M.D.     CYB/MEDQ  D:  08/24/2017  T:  08/24/2017  Job:  [id number]

## 2017-08-25 NOTE — Progress Notes (Signed)
Subjective: 1 Day Post-Op Procedure(s) (LRB): LEFT TOTAL HIP ARTHROPLASTY ANTERIOR APPROACH (Left) Patient reports pain as moderate.  No dizziness or lightheadedness. No Chest pain or SOB.   Objective: Vital signs in last 24 hours: Temp:  [97 F (36.1 C)-98.7 F (37.1 C)] 98.4 F (36.9 C) (01/09 0629) Pulse Rate:  [1-94] 69 (01/09 0629) Resp:  [11-18] 18 (01/09 0629) BP: (102-179)/(58-89) 132/61 (01/09 0629) SpO2:  [95 %-100 %] 99 % (01/09 0629) Weight:  [256 lb 9.9 oz (116.4 kg)] 256 lb 9.9 oz (116.4 kg) (01/08 1214)  Intake/Output from previous day: 01/08 0701 - 01/09 0700 In: 3240 [P.O.:240; I.V.:2750; IV Piggyback:250] Out: 1655 [Urine:1350; Blood:500] Intake/Output this shift: No intake/output data recorded.  Recent Labs    08/25/17 0539  HGB 9.9*   Recent Labs    08/25/17 0539  WBC 12.0*  RBC 3.84*  HCT 31.1*  PLT 224   Recent Labs    08/25/17 0539  NA 136  K 4.9  CL 102  CO2 26  BUN 16  CREATININE 0.70  GLUCOSE 154*  CALCIUM 8.9   No results for input(s): LABPT, INR in the last 72 hours.  Left lower extremity: Dorsiflexion/Plantar flexion intact Incision: dressing C/D/I Compartment soft  Assessment/Plan: 1 Day Post-Op Procedure(s) (LRB): LEFT TOTAL HIP ARTHROPLASTY ANTERIOR APPROACH (Left) Up with therapy ABLA  On chronic anemia monitor for symptoms of anemia  Chestina Komatsu 08/25/2017, 8:29 AM

## 2017-08-26 MED ORDER — METHOCARBAMOL 500 MG PO TABS: 500.0000 mg | ORAL_TABLET | Freq: Four times a day (QID) | ORAL | 1 refills | Status: DC | PRN

## 2017-08-26 MED ORDER — ASPIRIN 325 MG PO TBEC: 325.0000 mg | DELAYED_RELEASE_TABLET | Freq: Every day | ORAL | 0 refills | Status: DC

## 2017-08-26 MED ORDER — OXYCODONE HCL 10 MG PO TABS: 10.0000 mg | ORAL_TABLET | ORAL | 0 refills | Status: DC | PRN

## 2017-08-26 NOTE — Discharge Summary (Signed)
Patient ID: Stephanie Andrade MRN: [id number] DOB/AGE: May 17, 1956 62 y.o.  Admit date: 08/24/2017 Discharge date: 08/26/2017  Admission Diagnoses:  Principal Problem:   Unilateral primary osteoarthritis, left hip Active Problems:   Status post total replacement of left hip   Discharge Diagnoses:  Same  Past Medical History:  Diagnosis Date  . Anemia   . Arthritis   . Asthma   . Depression   . Family history of adverse reaction to anesthesia    sister, brother - PONV  . Family history of ovarian cancer   . Fibroids   . GERD (gastroesophageal reflux disease)    occ  . History of bronchitis   . History of sarcoidosis   . Hypertension   . Pneumonia    hx  . PONV (postoperative nausea and vomiting) 03/26/2016  . Wears glasses     Surgeries: Procedure(s): LEFT TOTAL HIP ARTHROPLASTY ANTERIOR APPROACH on 08/24/2017   Consultants:   Discharged Condition: Improved  Hospital Course: Stephanie Andrade is an 62 y.o. female who was admitted 08/24/2017 for operative treatment ofUnilateral primary osteoarthritis, left hip. Patient has severe unremitting pain that affects sleep, daily activities, and work/hobbies. After pre-op clearance the patient was taken to the operating room on 08/24/2017 and underwent  Procedure(s): LEFT TOTAL HIP ARTHROPLASTY ANTERIOR APPROACH.    Patient was given perioperative antibiotics:  Anti-infectives (From admission, onward)   Start     Dose/Rate Route Frequency Ordered Stop   08/25/17 0600  ceFAZolin (ANCEF) IVPB 2g/100 mL premix     2 g 200 mL/hr over 30 Minutes Intravenous On call to O.R. 08/24/17 1209 08/24/17 1510   08/24/17 2100  ceFAZolin (ANCEF) IVPB 1 g/50 mL premix     1 g 100 mL/hr over 30 Minutes Intravenous Every 6 hours 08/24/17 1852 08/24/17 2226   08/24/17 1815  ceFAZolin (ANCEF) IVPB 1 g/50 mL premix  Status:  Discontinued     1 g 100 mL/hr over 30 Minutes Intravenous Every 6 hours 08/24/17 1812 08/24/17 1852   08/24/17 1209  ceFAZolin  (ANCEF) 2-4 GM/100ML-% IVPB    Comments:  Rosenberger, Meredit: cabinet override      08/24/17 1209 08/24/17 1510       Patient was given sequential compression devices, early ambulation, and chemoprophylaxis to prevent DVT.  Patient benefited maximally from hospital stay and there were no complications.    Recent vital signs:  Patient Vitals for the past 24 hrs:  BP Temp Temp src Pulse Resp SpO2  08/26/17 0533 (!) 118/58 98.8 F (37.1 C) Oral 88 20 100 %  08/25/17 2121 125/74 (!) 100.5 F (38.1 C) Oral (!) 102 18 100 %  08/25/17 1355 (!) 123/48 98.5 F (36.9 C) Oral 89 - 98 %     Recent laboratory studies:  Recent Labs    08/25/17 0539  WBC 12.0*  HGB 9.9*  HCT 31.1*  PLT 224  NA 136  K 4.9  CL 102  CO2 26  BUN 16  CREATININE 0.70  GLUCOSE 154*  CALCIUM 8.9     Discharge Medications:   Allergies as of 08/26/2017      Reactions   Mushroom Extract Complex Anaphylaxis, Hives   Codeine Nausea Only   Dextromethorphan Nausea And Vomiting   Levaquin [levofloxacin In D5w] Other (See Comments)   Myalgias   Macrobid  [nitrofurantoin Macrocrystal] Nausea And Vomiting   Tape Itching, Other (See Comments)   Please use paper tape   Tequila Sunrise Flavor Nausea And Vomiting  Tessalon Perles Nausea And Vomiting      Medication List    STOP taking these medications   ibuprofen 200 MG tablet Commonly known as:  ADVIL,MOTRIN   meloxicam 15 MG tablet Commonly known as:  MOBIC     TAKE these medications   albuterol 108 (90 Base) MCG/ACT inhaler Commonly known as:  PROVENTIL HFA;VENTOLIN HFA Inhale 2 puffs into the lungs every 6 (six) hours as needed for wheezing or shortness of breath.   aspirin 325 MG EC tablet Take 1 tablet (325 mg total) by mouth daily with breakfast. Start taking on:  08/27/2017   buPROPion 150 MG 24 hr tablet Commonly known as:  WELLBUTRIN XL TAKE 1 TABLET EACH DAY. What changed:  See the new instructions.   docusate sodium 100 MG  capsule Commonly known as:  COLACE Take 100 mg by mouth daily.   estradiol 0.1 MG/GM vaginal cream Commonly known as:  ESTRACE Place 1 Applicatorful vaginally 2 (two) times a week.   HYDROcodone-acetaminophen 5-325 MG tablet Commonly known as:  NORCO/VICODIN Take 1 tablet by mouth 2 (two) times daily as needed for moderate pain.   losartan 50 MG tablet Commonly known as:  COZAAR Take 1 tablet (50 mg total) by mouth daily.   methocarbamol 500 MG tablet Commonly known as:  ROBAXIN Take 1 tablet (500 mg total) by mouth every 6 (six) hours as needed for muscle spasms.   MULTI-VITAMIN PO Take 1 tablet by mouth daily.   Oxycodone HCl 10 MG Tabs Take 1 tablet (10 mg total) by mouth every 3 (three) hours as needed for severe pain ((score 7 to 10)).   valACYclovir 500 MG tablet Commonly known as:  VALTREX TAKE 1 TABLET TWICE DAILY. What changed:    how much to take  how to take this  when to take this   Vitamin D3 10000 units Tabs Take 20,000 Units by mouth daily.   zolpidem 5 MG tablet Commonly known as:  AMBIEN TAKE 1 TABLET AT BEDTIME AS NEEDED FOR SLEEP. What changed:    how much to take  how to take this  when to take this  additional instructions            Durable Medical Equipment  (From admission, onward)        Start     Ordered   08/24/17 1813  DME 3 n 1  Once     08/24/17 1812   08/24/17 1813  DME Walker rolling  Once    Question:  Patient needs a walker to treat with the following condition  Answer:  Status post total replacement of left hip   08/24/17 1812      Diagnostic Studies: Dg Pelvis Portable  Result Date: 08/24/2017 CLINICAL DATA:  Followup left total hip replacement. EXAM: PORTABLE PELVIS 1-2 VIEWS COMPARISON:  Earlier same day FINDINGS: Left total hip replacement shows components well positioned without radiographically detectable complication. IMPRESSION: Good appearance following left total hip replacement. Electronically  Signed   By: Paulina Fusi M.D.   On: 08/24/2017 19:18   Dg C-arm 1-60 Min  Result Date: 08/24/2017 CLINICAL DATA:  Intraoperative imaging for left hip replacement. EXAM: DG C-ARM 61-120 MIN; OPERATIVE LEFT HIP WITH PELVIS COMPARISON:  None. FINDINGS: Two fluoroscopic intraoperative spot views of the pelvis and left hip are provided. Images demonstrate a left total hip arthroplasty is in place. The device is located. No acute abnormality. Right hip replacement is partially visualized. IMPRESSION: Intraoperative imaging for left  hip replacement.  No acute finding. Electronically Signed   By: Drusilla Kanner M.D.   On: 08/24/2017 18:57   Dg Hip Operative Unilat W Or W/o Pelvis Left  Result Date: 08/24/2017 CLINICAL DATA:  Intraoperative imaging for left hip replacement. EXAM: DG C-ARM 61-120 MIN; OPERATIVE LEFT HIP WITH PELVIS COMPARISON:  None. FINDINGS: Two fluoroscopic intraoperative spot views of the pelvis and left hip are provided. Images demonstrate a left total hip arthroplasty is in place. The device is located. No acute abnormality. Right hip replacement is partially visualized. IMPRESSION: Intraoperative imaging for left hip replacement.  No acute finding. Electronically Signed   By: Drusilla Kanner M.D.   On: 08/24/2017 18:57   Xr Knee 1-2 Views Left  Result Date: 08/18/2017 2 views of her left knee show moderate arthritic findings with medial joint space narrowing and patellofemoral arthritic changes with periarticular osteophytes throughout the knee.  There is otherwise no acute findings.   Disposition: 01-Home or Self Care    Follow-up Information    [facility], [facility] Follow up.   Specialty:  Home Health Services Why:  Home health PT  Contact information: [address] [place] [address] [phone]        Kathryne Hitch, MD. Schedule an appointment as soon as possible for a visit in 2 week(s).   Specialty:  Orthopedic Surgery Contact information: [address] [place] [address] [phone]        Kathryne Hitch, MD .   Specialty:  Orthopedic Surgery Contact information: [address] [place] [address] [phone]            Signed: Richardean Canal 08/26/2017, 1:09 PM

## 2017-08-26 NOTE — Discharge Instructions (Signed)

## 2017-08-26 NOTE — Progress Notes (Signed)
Subjective: 2 Days Post-Op Procedure(s) (LRB): LEFT TOTAL HIP ARTHROPLASTY ANTERIOR APPROACH (Left) Patient reports pain as moderate.  No chest pain , or dizziness.   Objective: Vital signs in last 24 hours: Temp:  [98.5 F (36.9 C)-100.5 F (38.1 C)] 98.8 F (37.1 C) (01/10 0533) Pulse Rate:  [88-102] 88 (01/10 0533) Resp:  [18-20] 20 (01/10 0533) BP: (118-125)/(48-74) 118/58 (01/10 0533) SpO2:  [98 %-100 %] 100 % (01/10 0533)  Intake/Output from previous day: 01/09 0701 - 01/10 0700 In: 1300 [P.O.:1300] Out: -  Intake/Output this shift: No intake/output data recorded.  Recent Labs    08/25/17 0539  HGB 9.9*   Recent Labs    08/25/17 0539  WBC 12.0*  RBC 3.84*  HCT 31.1*  PLT 224   Recent Labs    08/25/17 0539  NA 136  K 4.9  CL 102  CO2 26  BUN 16  CREATININE 0.70  GLUCOSE 154*  CALCIUM 8.9   No results for input(s): LABPT, INR in the last 72 hours.  Neurovascular intact Sensation intact distally Intact pulses distally Dorsiflexion/Plantar flexion intact Compartment soft Dressing clean dry and intact  Assessment/Plan: 2 Days Post-Op Procedure(s) (LRB): LEFT TOTAL HIP ARTHROPLASTY ANTERIOR APPROACH (Left) Up with therapy Discharge to home this after if does well with PT Priscille Shadduck 08/26/2017, 12:35 PM

## 2017-08-26 NOTE — Progress Notes (Addendum)
Physical Therapy Treatment Patient Details Name: Stephanie Andrade MRN: [id number] DOB: 1956-07-17 Today's Date: 08/26/2017    History of Present Illness 62 y.o. female s/p L THA. PMH includes: PONV, GERD, HTN, R THA.    PT Comments    Patient progressing very well with therapy. Session focused extensively on stair training, patient now to supervision level for OOB mobility. Patient demonstrating improved activity tolerance from prior session. Next visit will progress ambulation and reinforce stair training.    Follow Up Recommendations  Home health PT;Supervision for mobility/OOB;DC plan and follow up therapy as arranged by surgeon     Equipment Recommendations  None recommended by PT    Recommendations for Other Services       Precautions / Restrictions Precautions Precautions: Fall Restrictions Weight Bearing Restrictions: Yes LLE Weight Bearing: Weight bearing as tolerated    Mobility  Bed Mobility Overal bed mobility: Needs Assistance Bed Mobility: Supine to Sit     Supine to sit: Supervision;HOB elevated     General bed mobility comments: Supervision for safety  Transfers Overall transfer level: Needs assistance Equipment used: Rolling walker (2 wheeled) Transfers: Sit to/from Stand Sit to Stand: Supervision         General transfer comment: Superiviosn for safety, decreased effort today from prior sesssion.   Ambulation/Gait Ambulation/Gait assistance: Min guard;Supervision Ambulation Distance (Feet): 30 Feet Assistive device: Rolling walker (2 wheeled) Gait Pattern/deviations: Step-through pattern;Antalgic Gait velocity: decreased   General Gait Details: improved mechanics inside bari RW. consistent step through pattern now, still with narrow base of support.    Stairs Stairs: Yes   Stair Management: Two rails;One rail Left;One rail Right;Forwards Number of Stairs: 12 General stair comments: min guard progressed to supervision. Patient  utilizing step to gait, cues for sequencing and techinque. educated family for proper guarding positioning   Wheelchair Mobility    Modified Rankin (Stroke Patients Only)       Balance Overall balance assessment: Needs assistance Sitting-balance support: Feet supported Sitting balance-Leahy Scale: Good     Standing balance support: No upper extremity supported;During functional activity Standing balance-Leahy Scale: Fair                              Cognition Arousal/Alertness: Awake/alert Behavior During Therapy: WFL for tasks assessed/performed Overall Cognitive Status: Within Functional Limits for tasks assessed                                        Exercises      General Comments        Pertinent Vitals/Pain Pain Assessment: Faces Faces Pain Scale: Hurts little more Pain Location: L hip Pain Descriptors / Indicators: Discomfort;Aching;Grimacing Pain Intervention(s): Limited activity within patient's tolerance;Premedicated before session;Monitored during session    Home Living                      Prior Function            PT Goals (current goals can now be found in the care plan section) Acute Rehab PT Goals Patient Stated Goal: return home PT Goal Formulation: With patient/family Time For Goal Achievement: 09/01/17 Potential to Achieve Goals: Good Progress towards PT goals: Progressing toward goals    Frequency    7X/week      PT Plan Current plan remains appropriate  Co-evaluation              AM-PAC PT "6 Clicks" Daily Activity  Outcome Measure  Difficulty turning over in bed (including adjusting bedclothes, sheets and blankets)?: A Little Difficulty moving from lying on back to sitting on the side of the bed? : A Little Difficulty sitting down on and standing up from a chair with arms (e.g., wheelchair, bedside commode, etc,.)?: A Little Help needed moving to and from a bed to chair  (including a wheelchair)?: A Little Help needed walking in hospital room?: A Little Help needed climbing 3-5 steps with a railing? : A Little 6 Click Score: 18    End of Session Equipment Utilized During Treatment: Gait belt Activity Tolerance: Patient limited by pain Patient left: in bed;with call bell/phone within reach;with nursing/sitter in room;with family/visitor present Nurse Communication: Mobility status;Patient requests pain meds PT Visit Diagnosis: Unsteadiness on feet (R26.81);Other abnormalities of gait and mobility (R26.89);Muscle weakness (generalized) (M62.81);Pain Pain - Right/Left: Left Pain - part of body: Hip     Time: 1610-9604 PT Time Calculation (min) (ACUTE ONLY): 40 min  Charges:  $Gait Training: 23-37 mins                    G Codes:       Etta Grandchild, PT, DPT Acute Rehab Services Pager: [phone]     Etta Grandchild 08/26/2017, 9:22 AM

## 2017-08-26 NOTE — Progress Notes (Signed)
Physical Therapy Treatment Patient Details Name: Stephanie Andrade MRN: [id number] DOB: 11-24-1955 Today's Date: 08/26/2017    History of Present Illness 62 y.o. female s/p L THA. PMH includes: PONV, GERD, HTN, R THA.    PT Comments    Patient has progressed very well. Now ambulating with improved mechanics and endurance. Pt and family educated on safety considerations for home and have no further questions or concerns at this time. Pt has met all functional goals and will benefit from skilled home health PT when medically cleared for d/c.     Follow Up Recommendations  Home health PT;Supervision for mobility/OOB;DC plan and follow up therapy as arranged by surgeon     Equipment Recommendations  None recommended by PT    Recommendations for Other Services       Precautions / Restrictions Precautions Precautions: Fall Restrictions Weight Bearing Restrictions: Yes LLE Weight Bearing: Weight bearing as tolerated    Mobility  Bed Mobility Overal bed mobility: Needs Assistance Bed Mobility: Supine to Sit     Supine to sit: Supervision;HOB elevated     General bed mobility comments: Supervision for safety  Transfers Overall transfer level: Needs assistance Equipment used: Rolling walker (2 wheeled) Transfers: Sit to/from Stand Sit to Stand: Supervision         General transfer comment: Superiviosn for safety, decreased effort today from prior sesssion.   Ambulation/Gait Ambulation/Gait assistance: Min guard;Supervision Ambulation Distance (Feet): 200 Feet Assistive device: Rolling walker (2 wheeled) Gait Pattern/deviations: Step-through pattern;Antalgic Gait velocity: decreased   General Gait Details: increased distance. now supervision. consistent step through gait pattern with heel strike.    Stairs            Wheelchair Mobility    Modified Rankin (Stroke Patients Only)       Balance Overall balance assessment: Needs assistance Sitting-balance  support: Feet supported Sitting balance-Leahy Scale: Good     Standing balance support: No upper extremity supported;During functional activity Standing balance-Leahy Scale: Fair                              Cognition Arousal/Alertness: Awake/alert Behavior During Therapy: WFL for tasks assessed/performed Overall Cognitive Status: Within Functional Limits for tasks assessed                                        Exercises Total Joint Exercises Hip ABduction/ADduction: AROM;Left;10 reps;Standing Knee Flexion: AROM;10 reps;Standing;Left Marching in Standing: AROM;Left;10 reps Standing Hip Extension: AROM;Left;10 reps    General Comments        Pertinent Vitals/Pain Pain Assessment: Faces Faces Pain Scale: Hurts little more Pain Location: L hip Pain Descriptors / Indicators: Discomfort;Aching;Grimacing Pain Intervention(s): Limited activity within patient's tolerance;Monitored during session;Premedicated before session    Home Living                      Prior Function            PT Goals (current goals can now be found in the care plan section) Acute Rehab PT Goals Patient Stated Goal: return home PT Goal Formulation: With patient/family Time For Goal Achievement: 09/01/17 Potential to Achieve Goals: Good Progress towards PT goals: Progressing toward goals    Frequency    7X/week      PT Plan Current plan remains appropriate    Co-evaluation  AM-PAC PT "6 Clicks" Daily Activity  Outcome Measure  Difficulty turning over in bed (including adjusting bedclothes, sheets and blankets)?: A Little Difficulty moving from lying on back to sitting on the side of the bed? : A Little Difficulty sitting down on and standing up from a chair with arms (e.g., wheelchair, bedside commode, etc,.)?: A Little Help needed moving to and from a bed to chair (including a wheelchair)?: A Little Help needed walking in  hospital room?: A Little Help needed climbing 3-5 steps with a railing? : A Little 6 Click Score: 18    End of Session Equipment Utilized During Treatment: Gait belt Activity Tolerance: Patient limited by pain Patient left: in bed;with call bell/phone within reach;with nursing/sitter in room;with family/visitor present Nurse Communication: Mobility status;Patient requests pain meds PT Visit Diagnosis: Unsteadiness on feet (R26.81);Other abnormalities of gait and mobility (R26.89);Muscle weakness (generalized) (M62.81);Pain Pain - Right/Left: Left Pain - part of body: Hip     Time: 1610-9604 PT Time Calculation (min) (ACUTE ONLY): 29 min  Charges:  $Gait Training: 8-22 mins                    G Codes:      Etta Grandchild, PT, DPT Acute Rehab Services Pager: [phone]    Etta Grandchild 08/26/2017, 2:08 PM

## 2017-08-26 NOTE — Progress Notes (Signed)
Discharge paperwork reviewed with patient and patient's husband. Patient is ready for discharge.

## 2017-08-27 ENCOUNTER — Telehealth (INDEPENDENT_AMBULATORY_CARE_PROVIDER_SITE_OTHER): Payer: Self-pay | Admitting: Orthopaedic Surgery

## 2017-08-27 DIAGNOSIS — Z96643 Presence of artificial hip joint, bilateral: Secondary | ICD-10-CM | POA: Diagnosis not present

## 2017-08-27 DIAGNOSIS — I10 Essential (primary) hypertension: Secondary | ICD-10-CM | POA: Diagnosis not present

## 2017-08-27 DIAGNOSIS — Z471 Aftercare following joint replacement surgery: Secondary | ICD-10-CM | POA: Diagnosis not present

## 2017-08-27 DIAGNOSIS — Z6841 Body Mass Index (BMI) 40.0 and over, adult: Secondary | ICD-10-CM | POA: Diagnosis not present

## 2017-08-27 DIAGNOSIS — M48062 Spinal stenosis, lumbar region with neurogenic claudication: Secondary | ICD-10-CM | POA: Diagnosis not present

## 2017-08-27 DIAGNOSIS — M5412 Radiculopathy, cervical region: Secondary | ICD-10-CM | POA: Diagnosis not present

## 2017-08-27 DIAGNOSIS — K219 Gastro-esophageal reflux disease without esophagitis: Secondary | ICD-10-CM | POA: Diagnosis not present

## 2017-08-27 DIAGNOSIS — F329 Major depressive disorder, single episode, unspecified: Secondary | ICD-10-CM | POA: Diagnosis not present

## 2017-08-27 DIAGNOSIS — Z7982 Long term (current) use of aspirin: Secondary | ICD-10-CM | POA: Diagnosis not present

## 2017-08-27 DIAGNOSIS — D860 Sarcoidosis of lung: Secondary | ICD-10-CM | POA: Diagnosis not present

## 2017-08-27 DIAGNOSIS — Z79891 Long term (current) use of opiate analgesic: Secondary | ICD-10-CM | POA: Diagnosis not present

## 2017-08-27 DIAGNOSIS — J45909 Unspecified asthma, uncomplicated: Secondary | ICD-10-CM | POA: Diagnosis not present

## 2017-08-27 NOTE — Telephone Encounter (Signed)
Stephanie Andrade, see below, is it ok for them to change the occlusive dressing?  Patient came home with an extra dressing.  Please advise, thanks.

## 2017-08-27 NOTE — Telephone Encounter (Signed)
Lauren from [place] at [facility] called asking for verbal approval of 1 week 1, 3 week 2, and 2 week 2. CB # [phone]

## 2017-08-27 NOTE — Telephone Encounter (Signed)
IC and advised orders are ok. Also verified changed occlusive dressing changed 5-7 days.

## 2017-08-30 DIAGNOSIS — Z6841 Body Mass Index (BMI) 40.0 and over, adult: Secondary | ICD-10-CM | POA: Diagnosis not present

## 2017-08-30 DIAGNOSIS — Z96643 Presence of artificial hip joint, bilateral: Secondary | ICD-10-CM | POA: Diagnosis not present

## 2017-08-30 DIAGNOSIS — Z471 Aftercare following joint replacement surgery: Secondary | ICD-10-CM | POA: Diagnosis not present

## 2017-08-30 DIAGNOSIS — Z7982 Long term (current) use of aspirin: Secondary | ICD-10-CM | POA: Diagnosis not present

## 2017-08-30 DIAGNOSIS — M48062 Spinal stenosis, lumbar region with neurogenic claudication: Secondary | ICD-10-CM | POA: Diagnosis not present

## 2017-08-30 DIAGNOSIS — I10 Essential (primary) hypertension: Secondary | ICD-10-CM | POA: Diagnosis not present

## 2017-08-30 DIAGNOSIS — D860 Sarcoidosis of lung: Secondary | ICD-10-CM | POA: Diagnosis not present

## 2017-08-30 DIAGNOSIS — F329 Major depressive disorder, single episode, unspecified: Secondary | ICD-10-CM | POA: Diagnosis not present

## 2017-08-30 DIAGNOSIS — J45909 Unspecified asthma, uncomplicated: Secondary | ICD-10-CM | POA: Diagnosis not present

## 2017-08-30 DIAGNOSIS — K219 Gastro-esophageal reflux disease without esophagitis: Secondary | ICD-10-CM | POA: Diagnosis not present

## 2017-08-30 DIAGNOSIS — M5412 Radiculopathy, cervical region: Secondary | ICD-10-CM | POA: Diagnosis not present

## 2017-08-30 DIAGNOSIS — Z79891 Long term (current) use of opiate analgesic: Secondary | ICD-10-CM | POA: Diagnosis not present

## 2017-08-30 NOTE — Telephone Encounter (Signed)
IC Lauren and advised yes ok to changed the dressing as ordered. And orders are ok for PT

## 2017-08-30 NOTE — Telephone Encounter (Signed)
Yes it is fine to change dressing I gave them an extra one for that purpose

## 2017-09-01 DIAGNOSIS — Z471 Aftercare following joint replacement surgery: Secondary | ICD-10-CM | POA: Diagnosis not present

## 2017-09-01 DIAGNOSIS — Z79891 Long term (current) use of opiate analgesic: Secondary | ICD-10-CM | POA: Diagnosis not present

## 2017-09-01 DIAGNOSIS — M5412 Radiculopathy, cervical region: Secondary | ICD-10-CM | POA: Diagnosis not present

## 2017-09-01 DIAGNOSIS — D860 Sarcoidosis of lung: Secondary | ICD-10-CM | POA: Diagnosis not present

## 2017-09-01 DIAGNOSIS — J45909 Unspecified asthma, uncomplicated: Secondary | ICD-10-CM | POA: Diagnosis not present

## 2017-09-01 DIAGNOSIS — Z96643 Presence of artificial hip joint, bilateral: Secondary | ICD-10-CM | POA: Diagnosis not present

## 2017-09-01 DIAGNOSIS — Z6841 Body Mass Index (BMI) 40.0 and over, adult: Secondary | ICD-10-CM | POA: Diagnosis not present

## 2017-09-01 DIAGNOSIS — F329 Major depressive disorder, single episode, unspecified: Secondary | ICD-10-CM | POA: Diagnosis not present

## 2017-09-01 DIAGNOSIS — M48062 Spinal stenosis, lumbar region with neurogenic claudication: Secondary | ICD-10-CM | POA: Diagnosis not present

## 2017-09-01 DIAGNOSIS — I10 Essential (primary) hypertension: Secondary | ICD-10-CM | POA: Diagnosis not present

## 2017-09-01 DIAGNOSIS — K219 Gastro-esophageal reflux disease without esophagitis: Secondary | ICD-10-CM | POA: Diagnosis not present

## 2017-09-01 DIAGNOSIS — Z7982 Long term (current) use of aspirin: Secondary | ICD-10-CM | POA: Diagnosis not present

## 2017-09-03 DIAGNOSIS — Z79891 Long term (current) use of opiate analgesic: Secondary | ICD-10-CM | POA: Diagnosis not present

## 2017-09-03 DIAGNOSIS — M5412 Radiculopathy, cervical region: Secondary | ICD-10-CM | POA: Diagnosis not present

## 2017-09-03 DIAGNOSIS — D860 Sarcoidosis of lung: Secondary | ICD-10-CM | POA: Diagnosis not present

## 2017-09-03 DIAGNOSIS — I10 Essential (primary) hypertension: Secondary | ICD-10-CM | POA: Diagnosis not present

## 2017-09-03 DIAGNOSIS — M48062 Spinal stenosis, lumbar region with neurogenic claudication: Secondary | ICD-10-CM | POA: Diagnosis not present

## 2017-09-03 DIAGNOSIS — Z7982 Long term (current) use of aspirin: Secondary | ICD-10-CM | POA: Diagnosis not present

## 2017-09-03 DIAGNOSIS — Z6841 Body Mass Index (BMI) 40.0 and over, adult: Secondary | ICD-10-CM | POA: Diagnosis not present

## 2017-09-03 DIAGNOSIS — K219 Gastro-esophageal reflux disease without esophagitis: Secondary | ICD-10-CM | POA: Diagnosis not present

## 2017-09-03 DIAGNOSIS — J45909 Unspecified asthma, uncomplicated: Secondary | ICD-10-CM | POA: Diagnosis not present

## 2017-09-03 DIAGNOSIS — Z471 Aftercare following joint replacement surgery: Secondary | ICD-10-CM | POA: Diagnosis not present

## 2017-09-03 DIAGNOSIS — Z96643 Presence of artificial hip joint, bilateral: Secondary | ICD-10-CM | POA: Diagnosis not present

## 2017-09-03 DIAGNOSIS — F329 Major depressive disorder, single episode, unspecified: Secondary | ICD-10-CM | POA: Diagnosis not present

## 2017-09-06 DIAGNOSIS — I10 Essential (primary) hypertension: Secondary | ICD-10-CM | POA: Diagnosis not present

## 2017-09-06 DIAGNOSIS — Z6841 Body Mass Index (BMI) 40.0 and over, adult: Secondary | ICD-10-CM | POA: Diagnosis not present

## 2017-09-06 DIAGNOSIS — M5412 Radiculopathy, cervical region: Secondary | ICD-10-CM | POA: Diagnosis not present

## 2017-09-06 DIAGNOSIS — D860 Sarcoidosis of lung: Secondary | ICD-10-CM | POA: Diagnosis not present

## 2017-09-06 DIAGNOSIS — K219 Gastro-esophageal reflux disease without esophagitis: Secondary | ICD-10-CM | POA: Diagnosis not present

## 2017-09-06 DIAGNOSIS — F329 Major depressive disorder, single episode, unspecified: Secondary | ICD-10-CM | POA: Diagnosis not present

## 2017-09-06 DIAGNOSIS — Z79891 Long term (current) use of opiate analgesic: Secondary | ICD-10-CM | POA: Diagnosis not present

## 2017-09-06 DIAGNOSIS — Z96643 Presence of artificial hip joint, bilateral: Secondary | ICD-10-CM | POA: Diagnosis not present

## 2017-09-06 DIAGNOSIS — Z7982 Long term (current) use of aspirin: Secondary | ICD-10-CM | POA: Diagnosis not present

## 2017-09-06 DIAGNOSIS — M48062 Spinal stenosis, lumbar region with neurogenic claudication: Secondary | ICD-10-CM | POA: Diagnosis not present

## 2017-09-06 DIAGNOSIS — J45909 Unspecified asthma, uncomplicated: Secondary | ICD-10-CM | POA: Diagnosis not present

## 2017-09-06 DIAGNOSIS — Z471 Aftercare following joint replacement surgery: Secondary | ICD-10-CM | POA: Diagnosis not present

## 2017-09-07 ENCOUNTER — Encounter (INDEPENDENT_AMBULATORY_CARE_PROVIDER_SITE_OTHER): Payer: Self-pay | Admitting: Orthopaedic Surgery

## 2017-09-07 ENCOUNTER — Ambulatory Visit (INDEPENDENT_AMBULATORY_CARE_PROVIDER_SITE_OTHER): Payer: BLUE CROSS/BLUE SHIELD | Admitting: Orthopaedic Surgery

## 2017-09-07 DIAGNOSIS — Z96642 Presence of left artificial hip joint: Secondary | ICD-10-CM

## 2017-09-07 MED ORDER — OXYCODONE-ACETAMINOPHEN 5-325 MG PO TABS: 1.0000 | ORAL_TABLET | ORAL | 0 refills | Status: DC | PRN

## 2017-09-07 NOTE — Progress Notes (Signed)
[name] returns today 2 weeks status post left total hip arthroplasty.  Her postop recovery has been complicated by the flu and upper respiratory infection.  She states she is overall trending towards improvement she is not training as fast as she would like.  She states she had an easier recovery  at this point with the right total hip.  She remains on aspirin 325 daily for DVT prophylaxis.  She needs a refill on her Percocet.  Denies chest pain shortness breath fevers chills  Physical exam : General well-developed well-nourished female in no acute distress mood and affect appropriate Left hip: Surgical incisions healing well some slight abrasion of the proximal incision but no signs of infection.  Remainder the incisions well approximated with a running subcu stitch.  Left calf supple nontender.  Ambulates with a cane.  Status post left total hip arthroplasty  Plan: She will continue work on range of motion strengthening of the left hip.  Scar tissue mobilization encouraged.  She will remain on the aspirin 325 once daily for another week and then discontinue as she was on no aspirin prior to surgery.  Refill on her Percocet is given.  We will see her back in 1 month sooner if there is any questions or concerns.  Marland Kitchen

## 2017-09-08 DIAGNOSIS — D860 Sarcoidosis of lung: Secondary | ICD-10-CM | POA: Diagnosis not present

## 2017-09-08 DIAGNOSIS — Z7982 Long term (current) use of aspirin: Secondary | ICD-10-CM | POA: Diagnosis not present

## 2017-09-08 DIAGNOSIS — F329 Major depressive disorder, single episode, unspecified: Secondary | ICD-10-CM | POA: Diagnosis not present

## 2017-09-08 DIAGNOSIS — Z79891 Long term (current) use of opiate analgesic: Secondary | ICD-10-CM | POA: Diagnosis not present

## 2017-09-08 DIAGNOSIS — Z96643 Presence of artificial hip joint, bilateral: Secondary | ICD-10-CM | POA: Diagnosis not present

## 2017-09-08 DIAGNOSIS — J45909 Unspecified asthma, uncomplicated: Secondary | ICD-10-CM | POA: Diagnosis not present

## 2017-09-08 DIAGNOSIS — M48062 Spinal stenosis, lumbar region with neurogenic claudication: Secondary | ICD-10-CM | POA: Diagnosis not present

## 2017-09-08 DIAGNOSIS — Z6841 Body Mass Index (BMI) 40.0 and over, adult: Secondary | ICD-10-CM | POA: Diagnosis not present

## 2017-09-08 DIAGNOSIS — I10 Essential (primary) hypertension: Secondary | ICD-10-CM | POA: Diagnosis not present

## 2017-09-08 DIAGNOSIS — Z471 Aftercare following joint replacement surgery: Secondary | ICD-10-CM | POA: Diagnosis not present

## 2017-09-08 DIAGNOSIS — M5412 Radiculopathy, cervical region: Secondary | ICD-10-CM | POA: Diagnosis not present

## 2017-09-08 DIAGNOSIS — K219 Gastro-esophageal reflux disease without esophagitis: Secondary | ICD-10-CM | POA: Diagnosis not present

## 2017-09-10 DIAGNOSIS — K219 Gastro-esophageal reflux disease without esophagitis: Secondary | ICD-10-CM | POA: Diagnosis not present

## 2017-09-10 DIAGNOSIS — Z96643 Presence of artificial hip joint, bilateral: Secondary | ICD-10-CM | POA: Diagnosis not present

## 2017-09-10 DIAGNOSIS — Z6841 Body Mass Index (BMI) 40.0 and over, adult: Secondary | ICD-10-CM | POA: Diagnosis not present

## 2017-09-10 DIAGNOSIS — D860 Sarcoidosis of lung: Secondary | ICD-10-CM | POA: Diagnosis not present

## 2017-09-10 DIAGNOSIS — J45909 Unspecified asthma, uncomplicated: Secondary | ICD-10-CM | POA: Diagnosis not present

## 2017-09-10 DIAGNOSIS — M48062 Spinal stenosis, lumbar region with neurogenic claudication: Secondary | ICD-10-CM | POA: Diagnosis not present

## 2017-09-10 DIAGNOSIS — Z471 Aftercare following joint replacement surgery: Secondary | ICD-10-CM | POA: Diagnosis not present

## 2017-09-10 DIAGNOSIS — F329 Major depressive disorder, single episode, unspecified: Secondary | ICD-10-CM | POA: Diagnosis not present

## 2017-09-10 DIAGNOSIS — Z79891 Long term (current) use of opiate analgesic: Secondary | ICD-10-CM | POA: Diagnosis not present

## 2017-09-10 DIAGNOSIS — Z7982 Long term (current) use of aspirin: Secondary | ICD-10-CM | POA: Diagnosis not present

## 2017-09-10 DIAGNOSIS — I10 Essential (primary) hypertension: Secondary | ICD-10-CM | POA: Diagnosis not present

## 2017-09-10 DIAGNOSIS — M5412 Radiculopathy, cervical region: Secondary | ICD-10-CM | POA: Diagnosis not present

## 2017-09-14 DIAGNOSIS — F329 Major depressive disorder, single episode, unspecified: Secondary | ICD-10-CM | POA: Diagnosis not present

## 2017-09-14 DIAGNOSIS — M48062 Spinal stenosis, lumbar region with neurogenic claudication: Secondary | ICD-10-CM | POA: Diagnosis not present

## 2017-09-14 DIAGNOSIS — I10 Essential (primary) hypertension: Secondary | ICD-10-CM | POA: Diagnosis not present

## 2017-09-14 DIAGNOSIS — M5412 Radiculopathy, cervical region: Secondary | ICD-10-CM | POA: Diagnosis not present

## 2017-09-14 DIAGNOSIS — Z7982 Long term (current) use of aspirin: Secondary | ICD-10-CM | POA: Diagnosis not present

## 2017-09-14 DIAGNOSIS — Z6841 Body Mass Index (BMI) 40.0 and over, adult: Secondary | ICD-10-CM | POA: Diagnosis not present

## 2017-09-14 DIAGNOSIS — Z471 Aftercare following joint replacement surgery: Secondary | ICD-10-CM | POA: Diagnosis not present

## 2017-09-14 DIAGNOSIS — D860 Sarcoidosis of lung: Secondary | ICD-10-CM | POA: Diagnosis not present

## 2017-09-14 DIAGNOSIS — K219 Gastro-esophageal reflux disease without esophagitis: Secondary | ICD-10-CM | POA: Diagnosis not present

## 2017-09-14 DIAGNOSIS — J45909 Unspecified asthma, uncomplicated: Secondary | ICD-10-CM | POA: Diagnosis not present

## 2017-09-14 DIAGNOSIS — Z79891 Long term (current) use of opiate analgesic: Secondary | ICD-10-CM | POA: Diagnosis not present

## 2017-09-14 DIAGNOSIS — Z96643 Presence of artificial hip joint, bilateral: Secondary | ICD-10-CM | POA: Diagnosis not present

## 2017-09-28 ENCOUNTER — Telehealth (INDEPENDENT_AMBULATORY_CARE_PROVIDER_SITE_OTHER): Payer: Self-pay | Admitting: Orthopaedic Surgery

## 2017-09-28 NOTE — Telephone Encounter (Signed)
Please advise 

## 2017-09-28 NOTE — Telephone Encounter (Signed)
Patient called wanted to ask about new medication(Gabatenin).  Patient having pain.  Please call patient advise

## 2017-09-29 ENCOUNTER — Other Ambulatory Visit (INDEPENDENT_AMBULATORY_CARE_PROVIDER_SITE_OTHER): Payer: Self-pay

## 2017-09-29 MED ORDER — GABAPENTIN 300 MG PO CAPS: 300.0000 mg | ORAL_CAPSULE | Freq: Three times a day (TID) | ORAL | 1 refills | Status: DC | PRN

## 2017-09-29 NOTE — Telephone Encounter (Signed)
Is she talking about Gabapentin?  If so, the she can have 300 mg up to three times daily, #90.

## 2017-09-29 NOTE — Telephone Encounter (Signed)
Patient aware that I sent this into her pharmacy

## 2017-10-11 ENCOUNTER — Ambulatory Visit (INDEPENDENT_AMBULATORY_CARE_PROVIDER_SITE_OTHER): Payer: BLUE CROSS/BLUE SHIELD | Admitting: Orthopaedic Surgery

## 2017-10-11 ENCOUNTER — Encounter (INDEPENDENT_AMBULATORY_CARE_PROVIDER_SITE_OTHER): Payer: Self-pay | Admitting: Orthopaedic Surgery

## 2017-10-11 DIAGNOSIS — Z96642 Presence of left artificial hip joint: Secondary | ICD-10-CM

## 2017-10-11 MED ORDER — OXYCODONE-ACETAMINOPHEN 5-325 MG PO TABS: 1.0000 | ORAL_TABLET | Freq: Three times a day (TID) | ORAL | 0 refills | Status: DC | PRN

## 2017-10-11 NOTE — Progress Notes (Signed)
Patient is now 6 weeks status post a left total hip arthroplasty.  She feels like she can go back to work as of today.  She is having issues with just stamina and tiredness in general.  She does get some burning sensation in her knee.  She would like to get back into her regular swimming routine.  I can easily put her right hip and left hip the range of motion with no difficulty at all.  Her left knee shows no effusion and her pain seems to be in the anterior shin and some medial.  The knee feels ligamentously stable.  At this point she can certainly try energy bars or even shakes such as boost to help.  I do feel that with the significant brain with gadolinium for weeks to months now this is probably affected vitamin D which can decrease in stamina.  I did give her a note to let her get back to work today.  I am fine with her getting back into the pool and swimming.  I did refill her oxycodone which she is taking sparingly.  We will see her back in 3 months.  I would like just a low AP pelvis at that visit.

## 2017-10-12 IMAGING — RF DG C-ARM 61-120 MIN
1 series · 2 of 2 positions shown · non-contrast
Comparison: 11/20/2015.

CLINICAL DATA: Right total hip replacement

EXAM:
OPERATIVE right HIP (WITH PELVIS IF PERFORMED) 2 VIEWS
TECHNIQUE: Fluoroscopic spot image(s) were submitted for interpretation
post-operatively.

[Series 1: run · 2 of 2 slices shown]
[im 1/2]
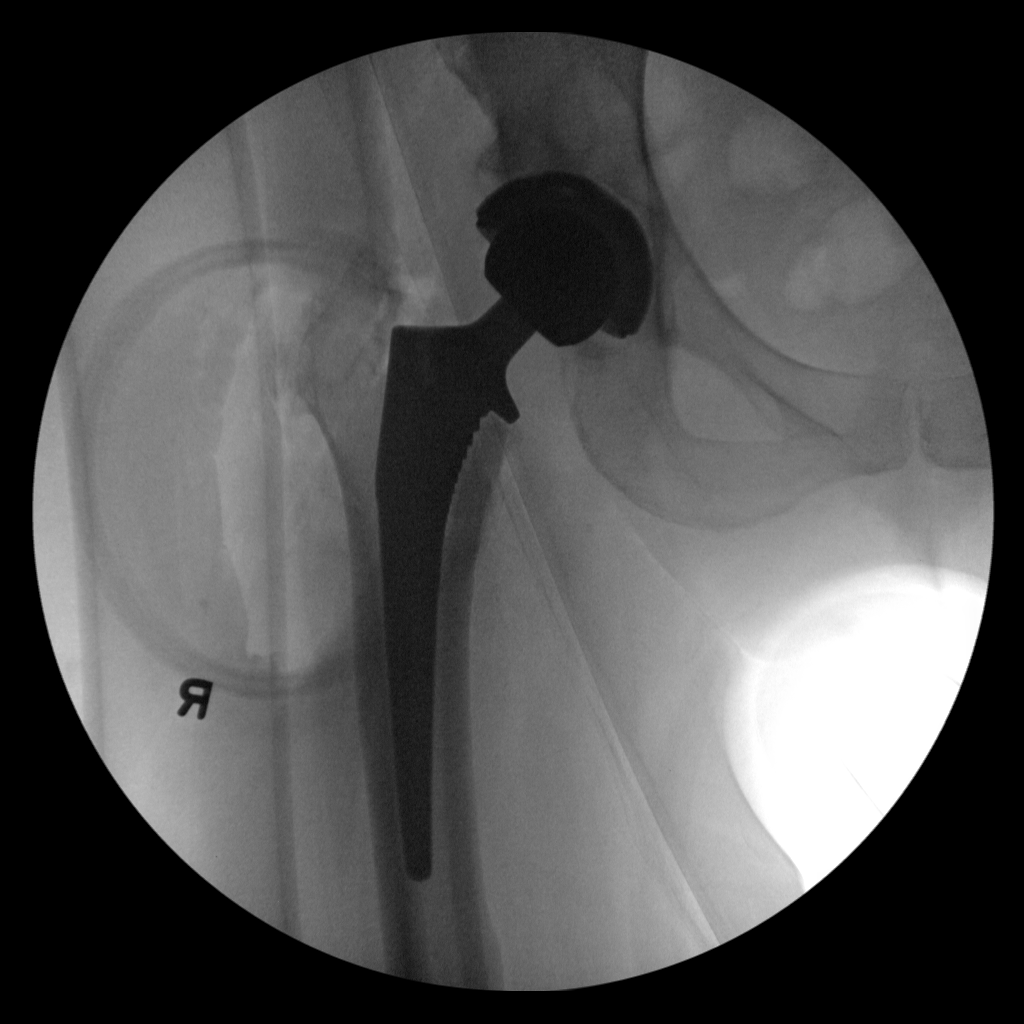
[im 2/2]
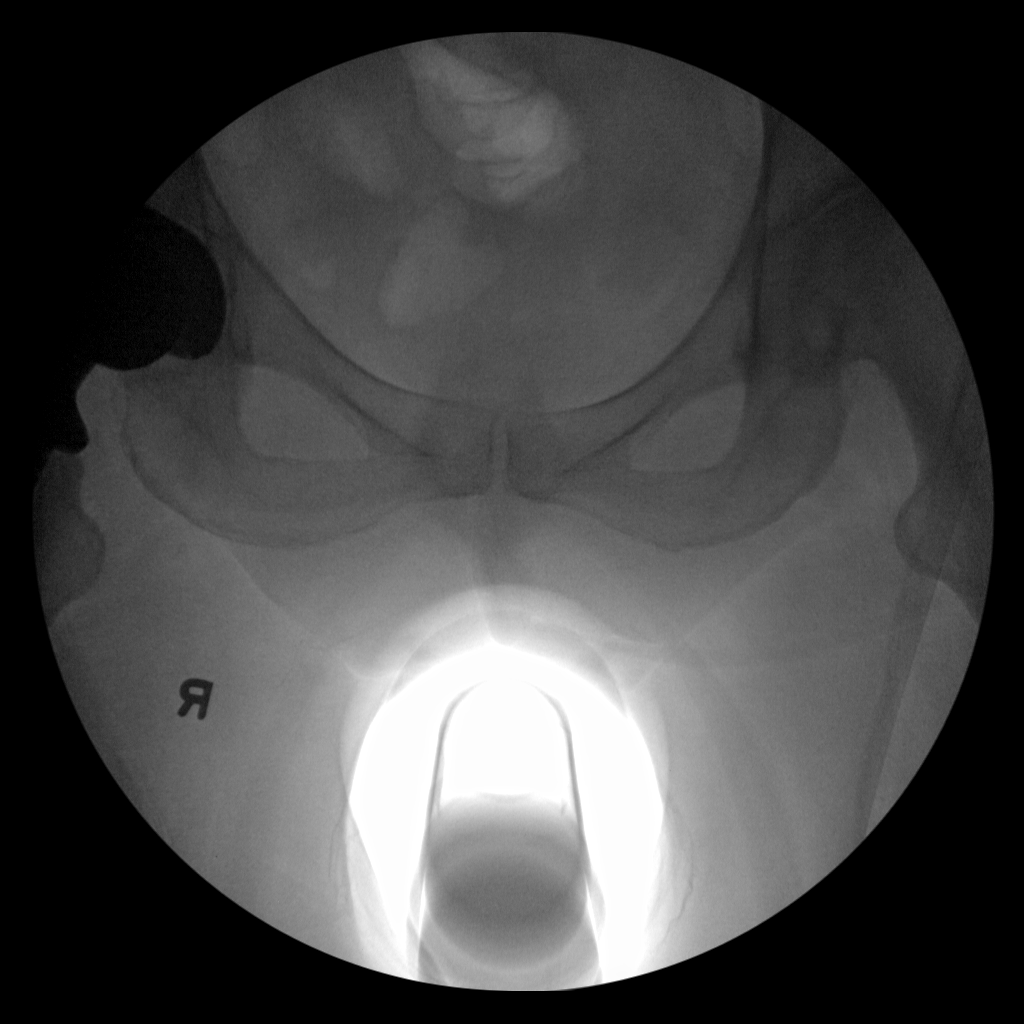

[2 of 2 positions shown; findings below may reference images not displayed]

FINDINGS: Intraoperative spot fluoro film show the patient be status post
right total hip replacement. No evidence for immediate hardware
complications.
IMPRESSION: Intraoperative evaluation during right hip replacement.

## 2017-10-12 IMAGING — CR DG HIP (WITH OR WITHOUT PELVIS) 1V PORT*R*
2 series · 2 of 2 positions shown · non-contrast
Comparison: 04/05/2015 and current operative images.

CLINICAL DATA: Status post total replacement of right hip

EXAM:
DG HIP (WITH OR WITHOUT PELVIS) 1V PORT RIGHT

[AP (1 of 2)]
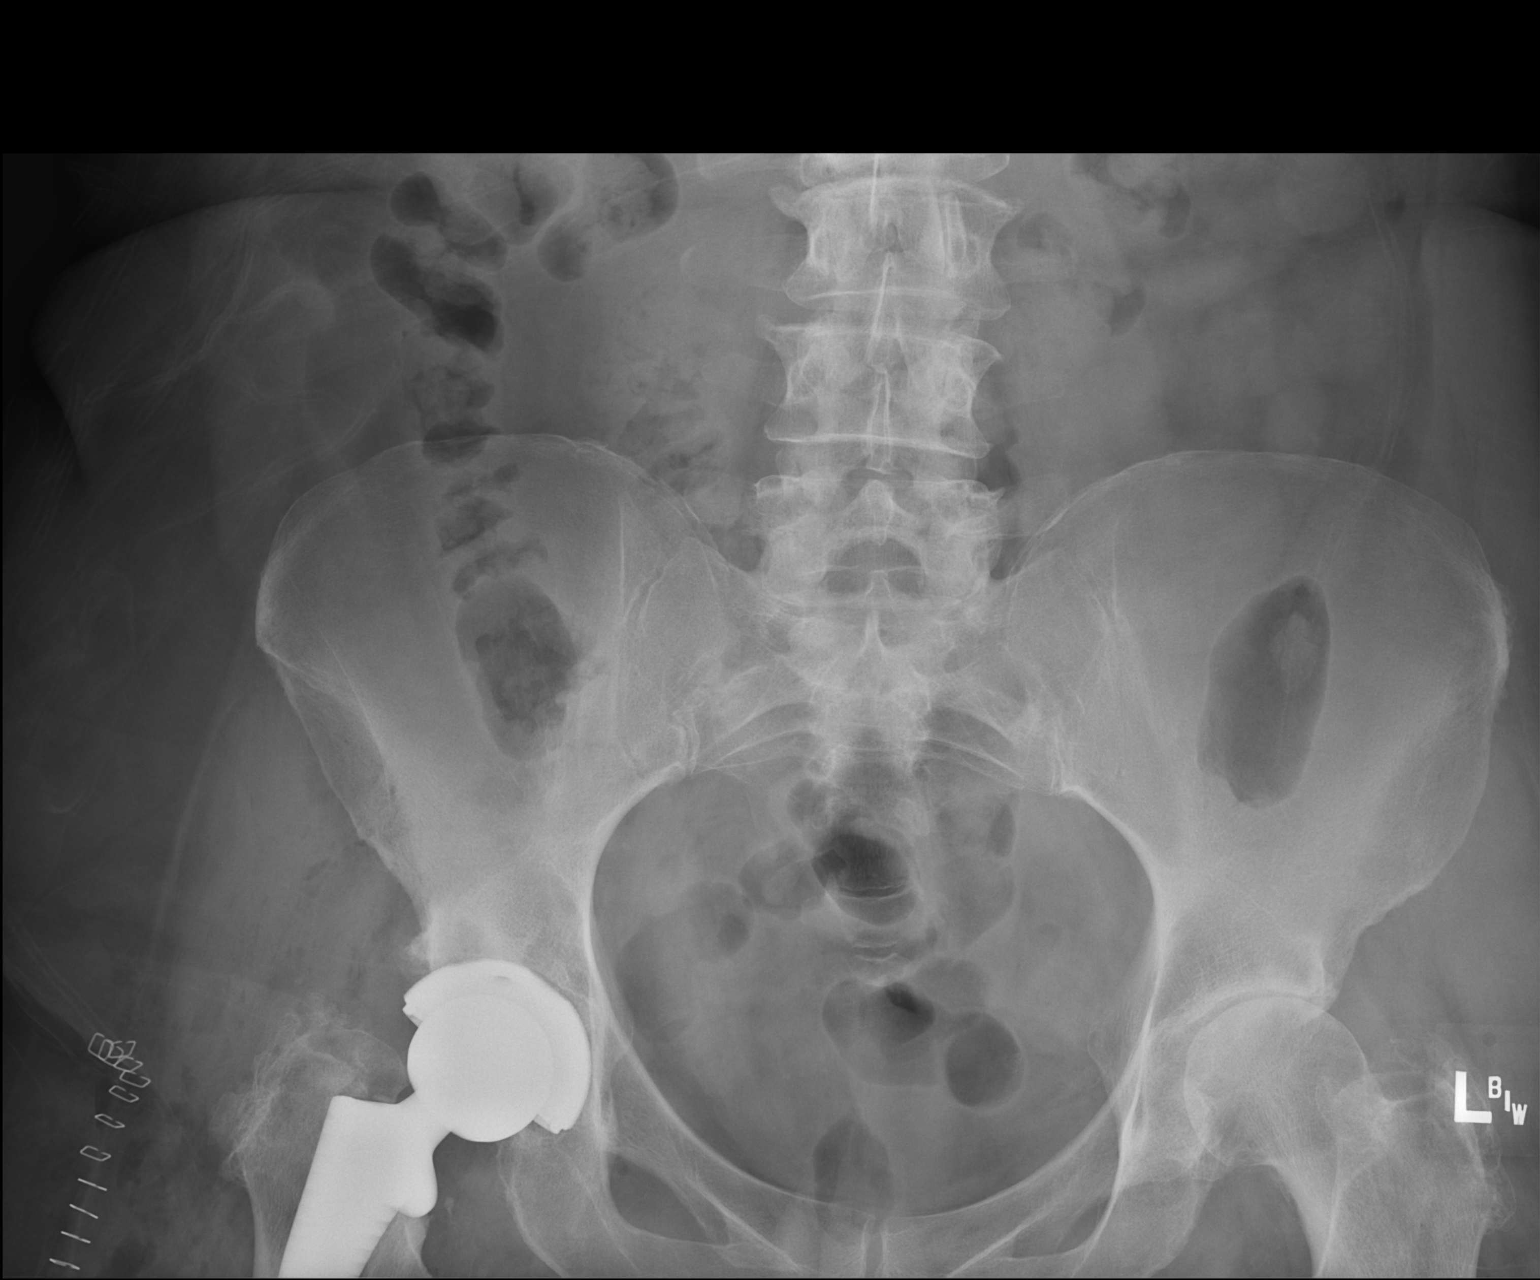

[AP (2 of 2)]
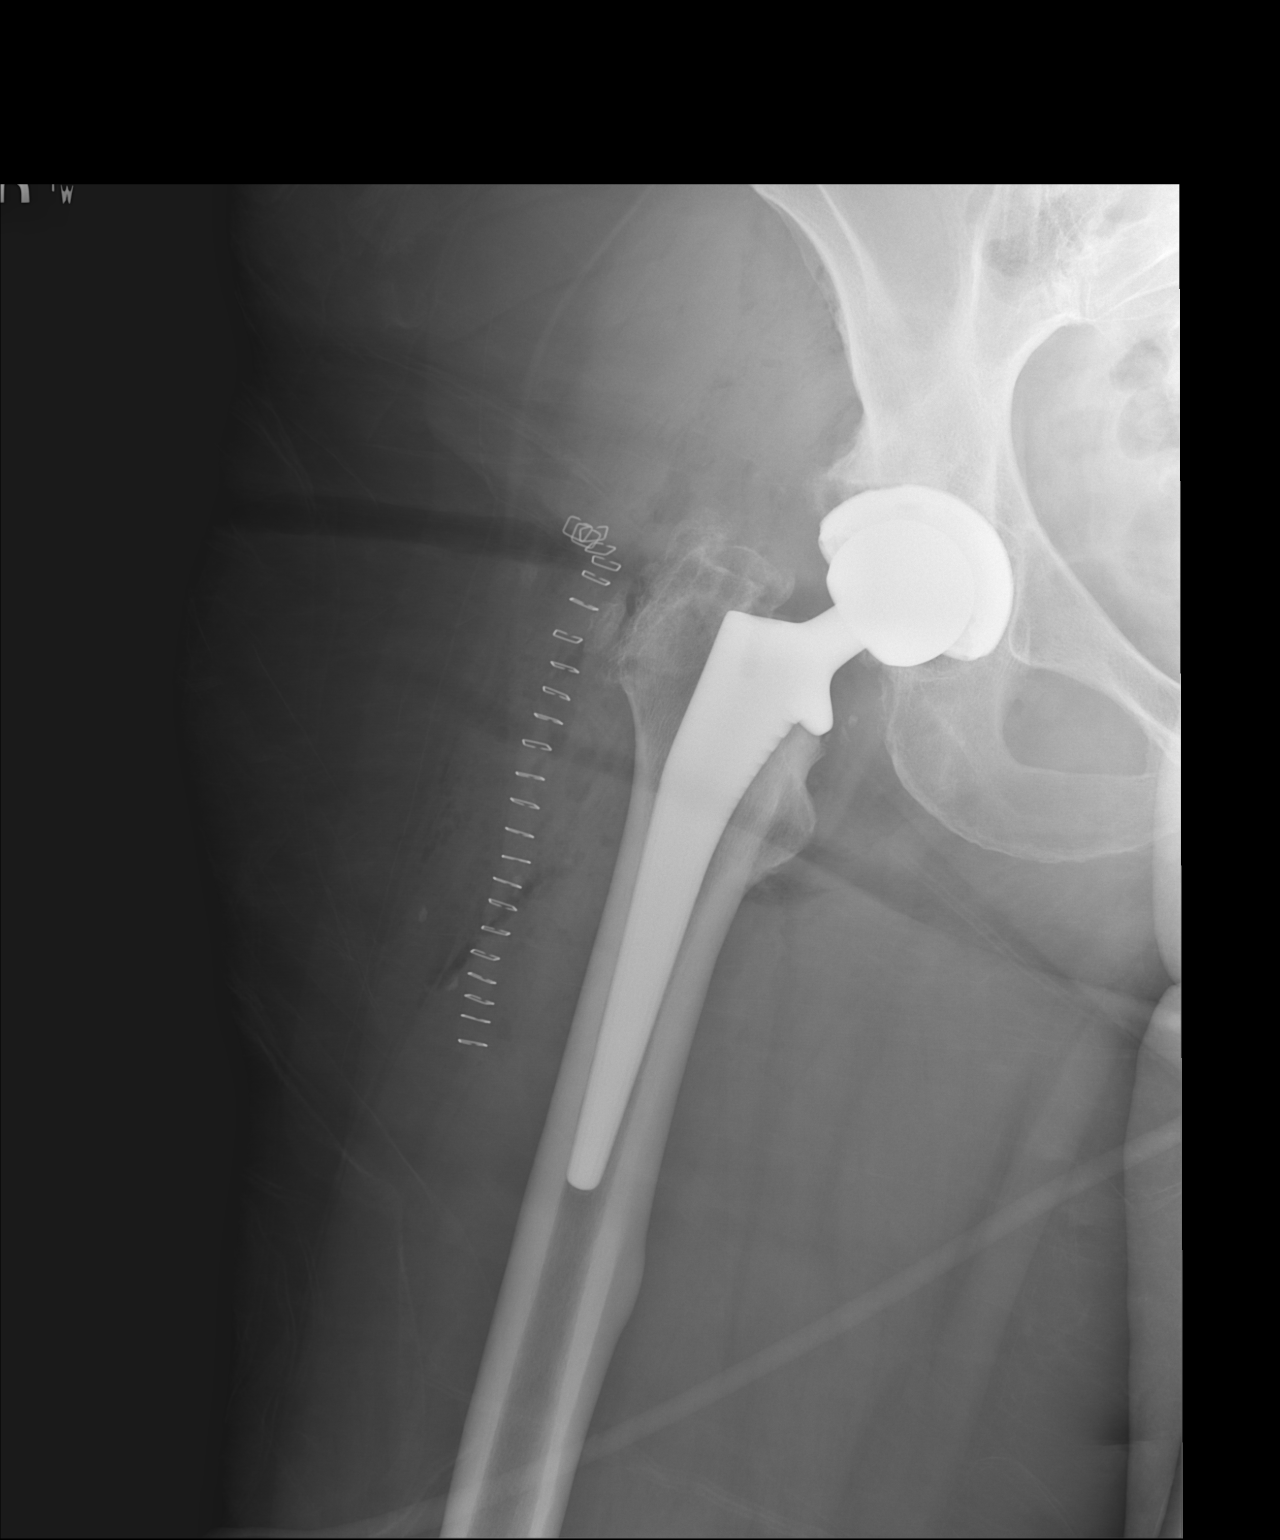

[2 of 2 positions shown; findings below may reference images not displayed]

FINDINGS: The femoral and acetabular components of the new right hip
arthroplasty appear well seated and well aligned.

There is no acute fracture or evidence of an operative complication.
IMPRESSION: Well-positioned right hip total arthroplasty.

## 2017-10-26 ENCOUNTER — Telehealth: Payer: Self-pay | Admitting: Internal Medicine

## 2017-10-28 MED ORDER — LOSARTAN POTASSIUM 50 MG PO TABS: 50.0000 mg | ORAL_TABLET | Freq: Every day | ORAL | 0 refills | Status: DC

## 2017-10-28 NOTE — Telephone Encounter (Signed)
Refill BP meds through May

## 2017-10-28 NOTE — Addendum Note (Signed)
Addended by: Mady Haagensen on: 10/28/2017 11:28 AM   Modules accepted: Orders

## 2017-10-28 NOTE — Telephone Encounter (Signed)
ESCRIBED

## 2017-10-28 NOTE — Telephone Encounter (Addendum)
Patient called regarding this denied prescription. I explained to her that per your note in August, she was to follow up in 6 months for a physical. She was under the impression that we already had one scheduled. Based on er appointment desk and the schedule book there was nothing previously schedule. She did go ahead and make a physical appointment on 12/28/2017 which was the first available morning opening.

## 2017-11-29 ENCOUNTER — Other Ambulatory Visit: Payer: Self-pay | Admitting: Internal Medicine

## 2017-12-01 DIAGNOSIS — J019 Acute sinusitis, unspecified: Secondary | ICD-10-CM | POA: Diagnosis not present

## 2017-12-28 ENCOUNTER — Ambulatory Visit (INDEPENDENT_AMBULATORY_CARE_PROVIDER_SITE_OTHER): Payer: BLUE CROSS/BLUE SHIELD | Admitting: Internal Medicine

## 2017-12-28 ENCOUNTER — Encounter: Payer: Self-pay | Admitting: Internal Medicine

## 2017-12-28 VITALS — BP 100/78 | HR 71 | Ht 66.0 in | Wt 259.0 lb

## 2017-12-28 DIAGNOSIS — R718 Other abnormality of red blood cells: Secondary | ICD-10-CM

## 2017-12-28 DIAGNOSIS — M545 Low back pain, unspecified: Secondary | ICD-10-CM

## 2017-12-28 DIAGNOSIS — Z8709 Personal history of other diseases of the respiratory system: Secondary | ICD-10-CM | POA: Diagnosis not present

## 2017-12-28 DIAGNOSIS — M7918 Myalgia, other site: Secondary | ICD-10-CM | POA: Diagnosis not present

## 2017-12-28 DIAGNOSIS — I10 Essential (primary) hypertension: Secondary | ICD-10-CM | POA: Diagnosis not present

## 2017-12-28 DIAGNOSIS — G8929 Other chronic pain: Secondary | ICD-10-CM | POA: Diagnosis not present

## 2017-12-28 DIAGNOSIS — Z96643 Presence of artificial hip joint, bilateral: Secondary | ICD-10-CM

## 2017-12-28 DIAGNOSIS — R7302 Impaired glucose tolerance (oral): Secondary | ICD-10-CM | POA: Diagnosis not present

## 2017-12-28 DIAGNOSIS — Z6841 Body Mass Index (BMI) 40.0 and over, adult: Secondary | ICD-10-CM

## 2017-12-28 DIAGNOSIS — K915 Postcholecystectomy syndrome: Secondary | ICD-10-CM

## 2017-12-28 DIAGNOSIS — Z0000 Encounter for general adult medical examination without abnormal findings: Secondary | ICD-10-CM

## 2017-12-28 DIAGNOSIS — Z9049 Acquired absence of other specified parts of digestive tract: Secondary | ICD-10-CM

## 2017-12-28 DIAGNOSIS — Z23 Encounter for immunization: Secondary | ICD-10-CM

## 2017-12-28 DIAGNOSIS — Z8659 Personal history of other mental and behavioral disorders: Secondary | ICD-10-CM | POA: Diagnosis not present

## 2017-12-28 DIAGNOSIS — E2839 Other primary ovarian failure: Secondary | ICD-10-CM | POA: Diagnosis not present

## 2017-12-28 LAB — POCT URINALYSIS DIPSTICK
APPEARANCE: NORMAL
BILIRUBIN UA: NEGATIVE
Blood, UA: NEGATIVE
GLUCOSE UA: NEGATIVE
Ketones, UA: NEGATIVE
LEUKOCYTES UA: NEGATIVE
Nitrite, UA: NEGATIVE
Odor: NORMAL
Protein, UA: NEGATIVE
Spec Grav, UA: 1.015 (ref 1.010–1.025)
Urobilinogen, UA: 0.2 E.U./dL
pH, UA: 6 (ref 5.0–8.0)

## 2017-12-28 NOTE — Progress Notes (Signed)
Subjective:    Patient ID: Stephanie Andrade, female    DOB: 1955/09/20, 62 y.o.   MRN: [id number]  HPI 62 year old Female for health maintenance exam and evaluation of medical issues.   Had right hip replacement August 2017 and left hip replacement January 2019.  Is doing well.  History of cholecystectomy February 2018.  Trying to do more exercise.  She took Contrave for weight loss for a while but is no longer on that medication.  Patient says that she is been told she is not a candidate for gastric bypass surgery because she says her weight is not to the excess that she would be considered a candidate.  History of depression treated with Wellbutrin.  History of asthma, vitamin D deficiency musculoskeletal pain back and legs.  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  Had tetanus immunization February 2011.  Has been on Benicar HCTZ since 2008 for hypertension.  Left heel fracture 2002.  Bilateral carpal tunnel syndrome diagnosed 1997.  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  Cannot tolerate hydrocodone Tessalon Perles or dextromethorphan.  Codeine causes vomiting.  A colonoscopy done by Dr. Matthias Hughs in 2009.  Social history: She is married.  No children.  For your college degree.  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  Social alcohol consumption.  Does not smoke.  Family history: Brother died with history of hypertension and alcoholism.  Father died of complications of dementia.  Mother died at age 31 of ovarian cancer.  One sister in good health.  One older brother living with hypertension and hyperlipidemia.  Father also has hyperlipidemia.    Review of Systems  Constitutional: Negative.   All other systems reviewed and are negative.      Objective:   Physical Exam  Constitutional: She is oriented to person, place, and time. She appears well-developed and well-nourished. No distress.  HENT:  Head: Normocephalic and atraumatic.  Right Ear: External  ear normal.  Left Ear: External ear normal.  Mouth/Throat: Oropharynx is clear and moist. No oropharyngeal exudate.  Neck: Neck supple. No JVD present. No thyromegaly present.  Cardiovascular: Normal rate, regular rhythm, normal heart sounds and intact distal pulses.  No murmur heard. Pulmonary/Chest: Effort normal and breath sounds normal. No stridor. No respiratory distress. She has no wheezes.  Breasts normal female  Abdominal: Soft. Bowel sounds are normal. She exhibits no distension and no mass. There is no tenderness. There is no rebound and no guarding.  Genitourinary:  Genitourinary Comments: Deferred to GYN  Lymphadenopathy:    She has no cervical adenopathy.  Neurological: She is oriented to person, place, and time. She displays normal reflexes. No cranial nerve deficit or sensory deficit. She exhibits normal muscle tone. Coordination normal.  Skin: Skin is warm and dry. She is not diaphoretic. No erythema.  Psychiatric: She has a normal mood and affect. Her behavior is normal. Judgment and thought content normal.  Vitals reviewed.         Assessment & Plan:  Essential hypertension-stable on current regimen  Status post bilateral hip arthroplasties  History of depression-treated with Wellbutrin  Obesity-continue with exercise and diet efforts  History of impaired glucose tolerance hemoglobin A1c 5.8%-  History of sarcoidosis diagnosed in 1995-asymptomatic  History of asthma  History of vitamin D deficiency-vitamin D level is excessive at 103.  Needs to cut back on her dosage  Microcytosis and iron deficiency-needs to take oral iron supplement and follow-up in 6 weeks.  History of elevated liver  functions likely due to fatty liver and issues with cholecystectomy in the past.  Liver panel is now normal.  Lipids are normal.  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

## 2017-12-29 ENCOUNTER — Telehealth: Payer: Self-pay | Admitting: Internal Medicine

## 2017-12-29 ENCOUNTER — Other Ambulatory Visit: Payer: Self-pay

## 2017-12-29 ENCOUNTER — Other Ambulatory Visit: Payer: Self-pay | Admitting: Internal Medicine

## 2017-12-29 DIAGNOSIS — R718 Other abnormality of red blood cells: Secondary | ICD-10-CM

## 2017-12-29 NOTE — Telephone Encounter (Signed)
Mailed lab results. 

## 2017-12-30 LAB — CBC WITH DIFFERENTIAL/PLATELET
Basophils Absolute: 27 cells/uL (ref 0–200)
Basophils Relative: 0.4 %
Eosinophils Absolute: 241 cells/uL (ref 15–500)
Eosinophils Relative: 3.6 %
HCT: 37.1 % (ref 35.0–45.0)
Hemoglobin: 12.1 g/dL (ref 11.7–15.5)
Lymphs Abs: 2285 cells/uL (ref 850–3900)
MCH: 24.6 pg — ABNORMAL LOW (ref 27.0–33.0)
MCHC: 32.6 g/dL (ref 32.0–36.0)
MCV: 75.6 fL — AB (ref 80.0–100.0)
MPV: 11.1 fL (ref 7.5–12.5)
Monocytes Relative: 5.2 %
NEUTROS PCT: 56.7 %
Neutro Abs: 3799 cells/uL (ref 1500–7800)
PLATELETS: 255 10*3/uL (ref 140–400)
RBC: 4.91 10*6/uL (ref 3.80–5.10)
RDW: 13.8 % (ref 11.0–15.0)
TOTAL LYMPHOCYTE: 34.1 %
WBC: 6.7 10*3/uL (ref 3.8–10.8)
WBCMIX: 348 {cells}/uL (ref 200–950)

## 2017-12-30 LAB — MICROALBUMIN / CREATININE URINE RATIO
CREATININE, URINE: 65 mg/dL (ref 20–275)
Microalb Creat Ratio: 3 mcg/mg creat (ref <30)
Microalb, Ur: 0.2 mg/dL

## 2017-12-30 LAB — COMPLETE METABOLIC PANEL WITH GFR
AG RATIO: 1.5 (calc) (ref 1.0–2.5)
ALKALINE PHOSPHATASE (APISO): 88 U/L (ref 33–130)
ALT: 11 U/L (ref 6–29)
AST: 16 U/L (ref 10–35)
Albumin: 4.3 g/dL (ref 3.6–5.1)
BILIRUBIN TOTAL: 0.4 mg/dL (ref 0.2–1.2)
BUN: 16 mg/dL (ref 7–25)
CHLORIDE: 101 mmol/L (ref 98–110)
CO2: 32 mmol/L (ref 20–32)
CREATININE: 0.76 mg/dL (ref 0.50–0.99)
Calcium: 9.6 mg/dL (ref 8.6–10.4)
GFR, Est African American: 99 mL/min/{1.73_m2} (ref >=60)
GFR, Est Non African American: 85 mL/min/{1.73_m2} (ref >=60)
GLOBULIN: 2.8 g/dL (ref 1.9–3.7)
Glucose, Bld: 94 mg/dL (ref 65–99)
POTASSIUM: 4.7 mmol/L (ref 3.5–5.3)
SODIUM: 140 mmol/L (ref 135–146)
Total Protein: 7.1 g/dL (ref 6.1–8.1)

## 2017-12-30 LAB — LIPID PANEL
CHOLESTEROL: 170 mg/dL (ref <200)
HDL: 69 mg/dL (ref >50)
LDL CHOLESTEROL (CALC): 81 mg/dL
Non-HDL Cholesterol (Calc): 101 mg/dL (calc) (ref <130)
TRIGLYCERIDES: 106 mg/dL (ref <150)
Total CHOL/HDL Ratio: 2.5 (calc) (ref <5.0)

## 2017-12-30 LAB — TSH: TSH: 0.86 m[IU]/L (ref 0.40–4.50)

## 2017-12-30 LAB — TEST AUTHORIZATION

## 2017-12-30 LAB — IRON, TOTAL/TOTAL IRON BINDING CAP
%SAT: 13 % (calc) (ref 11–50)
Iron: 49 ug/dL (ref 45–160)
TIBC: 385 mcg/dL (calc) (ref 250–450)

## 2017-12-30 LAB — FERRITIN: FERRITIN: 28 ng/mL (ref 20–288)

## 2017-12-30 LAB — HEMOGLOBIN A1C
Hgb A1c MFr Bld: 5.8 % of total Hgb — ABNORMAL HIGH (ref <5.7)
Mean Plasma Glucose: 120 (calc)
eAG (mmol/L): 6.6 (calc)

## 2017-12-30 LAB — VITAMIN D 25 HYDROXY (VIT D DEFICIENCY, FRACTURES): Vit D, 25-Hydroxy: 103 ng/mL — ABNORMAL HIGH (ref 30–100)

## 2018-01-04 DIAGNOSIS — N644 Mastodynia: Secondary | ICD-10-CM | POA: Diagnosis not present

## 2018-01-04 DIAGNOSIS — N6459 Other signs and symptoms in breast: Secondary | ICD-10-CM | POA: Diagnosis not present

## 2018-01-06 ENCOUNTER — Ambulatory Visit (INDEPENDENT_AMBULATORY_CARE_PROVIDER_SITE_OTHER): Payer: BLUE CROSS/BLUE SHIELD | Admitting: Orthopaedic Surgery

## 2018-01-06 ENCOUNTER — Encounter (INDEPENDENT_AMBULATORY_CARE_PROVIDER_SITE_OTHER): Payer: Self-pay | Admitting: Orthopaedic Surgery

## 2018-01-06 ENCOUNTER — Ambulatory Visit (INDEPENDENT_AMBULATORY_CARE_PROVIDER_SITE_OTHER): Payer: Self-pay

## 2018-01-06 ENCOUNTER — Other Ambulatory Visit: Payer: Self-pay | Admitting: Gynecology

## 2018-01-06 DIAGNOSIS — Z96642 Presence of left artificial hip joint: Secondary | ICD-10-CM

## 2018-01-06 DIAGNOSIS — N644 Mastodynia: Secondary | ICD-10-CM

## 2018-01-06 MED ORDER — DICLOFENAC SODIUM 1 % TD GEL: 2.0000 g | Freq: Four times a day (QID) | TRANSDERMAL | 3 refills | Status: DC

## 2018-01-06 NOTE — Progress Notes (Signed)
The patient is now over 4 months status post a left total hip arthroplasty.  She is 2 years out from a right total hip arthroplasty.  She does report some pain at the top of her incision but otherwise seems to be doing well overall.  Is walking with a more normal gait as each week goes by she does feel better overall.  She has no significant issues and does feel like her leg lengths are near equal.  Her left hip and right hips do move fluidly and normal.  She is tender at the top of her incision but there is no evidence of infection.  Her leg lengths are equal.  X-rays show well-seated implant bilateral with no complicating features.  This point we will try at least may be some Voltaren gel for insurance will cover it and just give this time and hopefully with time things will calm down significantly.  I would like to see her in 6 months from now but no x-rays are needed unless she is having issues.  All questions concerns were answered and addressed.

## 2018-01-12 ENCOUNTER — Ambulatory Visit
Admission: RE | Admit: 2018-01-12 | Discharge: 2018-01-12 | Disposition: A | Discharge status: Home / Self Care | Payer: BLUE CROSS/BLUE SHIELD | Source: Ambulatory Visit | Attending: Gynecology | Admitting: Gynecology

## 2018-01-12 DIAGNOSIS — R928 Other abnormal and inconclusive findings on diagnostic imaging of breast: Secondary | ICD-10-CM | POA: Diagnosis not present

## 2018-01-12 DIAGNOSIS — N6489 Other specified disorders of breast: Secondary | ICD-10-CM | POA: Diagnosis not present

## 2018-01-12 DIAGNOSIS — N644 Mastodynia: Secondary | ICD-10-CM

## 2018-01-14 NOTE — Patient Instructions (Signed)
Please take iron supplement and follow-up in 6 weeks with iron studies and office visit.

## 2018-01-21 ENCOUNTER — Telehealth: Payer: Self-pay | Admitting: Internal Medicine

## 2018-01-21 NOTE — Telephone Encounter (Signed)
Called patient to advise that per her recent lab work she looks iron deficient.  Dr. Renold Genta feels this may be related to her recent surgery.  Dr. Renold Genta would like for patient to begin taking OTC iron supplement (325mg ) daily 1-2 tablets (as tolerated).  Then repeat iron studies (Fe, TIBS, Ferritin, CBC w/diff) in 6 weeks.  Patient will be on vacation through the last week in July.  So, made appointment for 8/1 for repeat labs.  Patient to call if she has any questions.

## 2018-02-08 ENCOUNTER — Other Ambulatory Visit: Payer: Self-pay | Admitting: Internal Medicine

## 2018-03-17 ENCOUNTER — Other Ambulatory Visit: Payer: BLUE CROSS/BLUE SHIELD | Admitting: Internal Medicine

## 2018-03-17 DIAGNOSIS — R718 Other abnormality of red blood cells: Secondary | ICD-10-CM | POA: Diagnosis not present

## 2018-03-17 DIAGNOSIS — E611 Iron deficiency: Secondary | ICD-10-CM

## 2018-03-18 LAB — CBC WITH DIFFERENTIAL/PLATELET
BASOS ABS: 37 {cells}/uL (ref 0–200)
Basophils Relative: 0.6 %
EOS ABS: 143 {cells}/uL (ref 15–500)
EOS PCT: 2.3 %
HEMATOCRIT: 39.8 % (ref 35.0–45.0)
HEMOGLOBIN: 13 g/dL (ref 11.7–15.5)
LYMPHS ABS: 1736 {cells}/uL (ref 850–3900)
MCH: 25.7 pg — ABNORMAL LOW (ref 27.0–33.0)
MCHC: 32.7 g/dL (ref 32.0–36.0)
MCV: 78.8 fL — AB (ref 80.0–100.0)
MPV: 11.1 fL (ref 7.5–12.5)
Monocytes Relative: 5.5 %
NEUTROS PCT: 63.6 %
Neutro Abs: 3943 cells/uL (ref 1500–7800)
Platelets: 247 10*3/uL (ref 140–400)
RBC: 5.05 10*6/uL (ref 3.80–5.10)
RDW: 14 % (ref 11.0–15.0)
Total Lymphocyte: 28 %
WBC: 6.2 10*3/uL (ref 3.8–10.8)
WBCMIX: 341 {cells}/uL (ref 200–950)

## 2018-03-18 LAB — IRON,TIBC AND FERRITIN PANEL
%SAT: 15 % (calc) — ABNORMAL LOW (ref 16–45)
FERRITIN: 30 ng/mL (ref 16–288)
IRON: 61 ug/dL (ref 45–160)
TIBC: 401 ug/dL (ref 250–450)

## 2018-03-28 ENCOUNTER — Other Ambulatory Visit: Payer: Self-pay | Admitting: Internal Medicine

## 2018-04-04 ENCOUNTER — Ambulatory Visit (INDEPENDENT_AMBULATORY_CARE_PROVIDER_SITE_OTHER): Payer: BLUE CROSS/BLUE SHIELD | Admitting: Orthopaedic Surgery

## 2018-04-10 IMAGING — MR MR 3D RECON AT SCANNER
10 series · 16 of 16 positions shown · non-contrast
Comparison: Abdominal sonogram from earlier today.

CLINICAL DATA: 60-year-old female inpatient with epigastric/right
upper quadrant abdominal pain, elevated bilirubin, cholelithiasis
and dilated common bile duct at sonography.

EXAM:
MRI ABDOMEN WITHOUT CONTRAST
TECHNIQUE: Multiplanar multisequence MR imaging was performed without the
administration of intravenous contrast.

[Series 3: T2 · axial · 5.0mm · 0.78mm/px · 1 of 54 slices shown (1 of 2)]
[im 1/54]
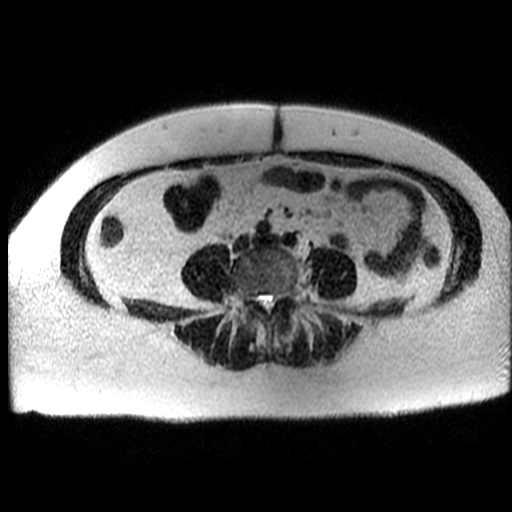

[Series 4: ax dualecho · axial · 5.0mm · 0.78mm/px · z∈[-120,+145]mm · 3 of 108 slices shown]
[im 1/108]
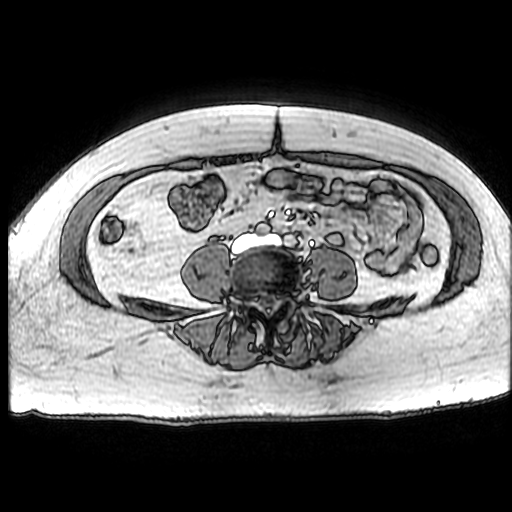
[im 54/108]
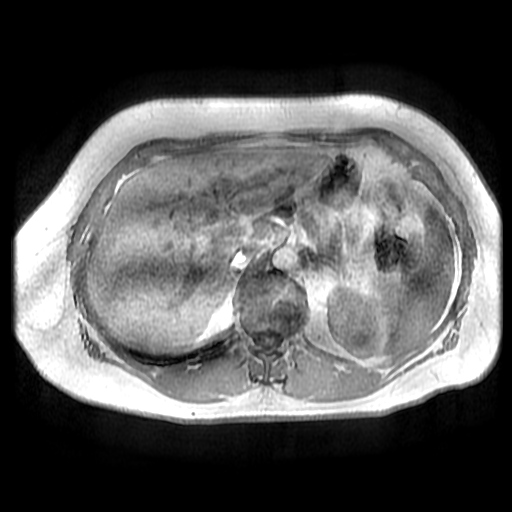
[im 108/108]
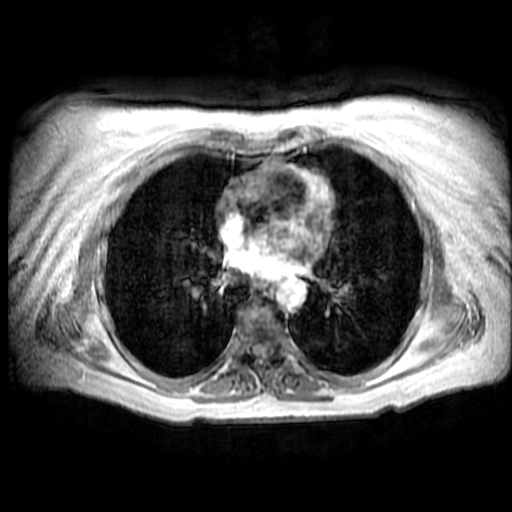

[Series 5: T2 · coronal · 5.0mm · 0.78mm/px · 1 of 42 slices shown (2 of 2)]
[im 1/42]
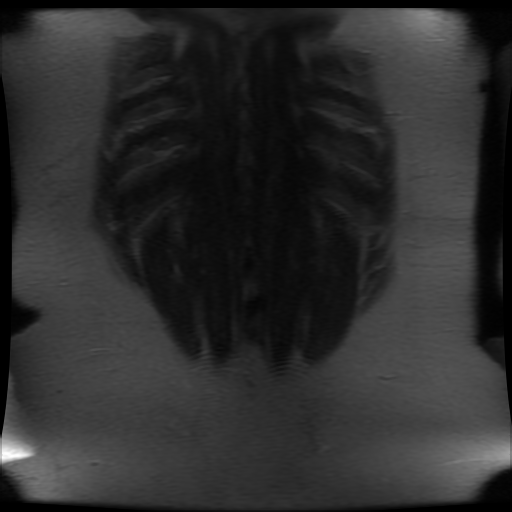

[Series 7: MRCP · sagittal · 40.0mm · 0.70mm/px · 1 of 6 slices shown (1 of 2)]
[im 1/6]
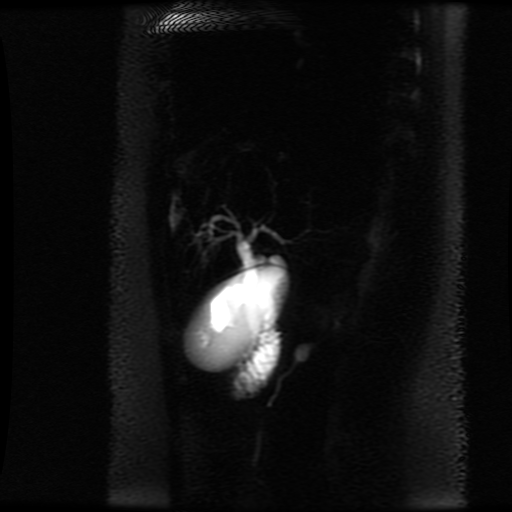

[Series 8: DWI b500 · axial · 6.0mm · 1.48mm/px · z∈[-121,+145]mm · 2 of 70 slices shown]
[im 1/70]
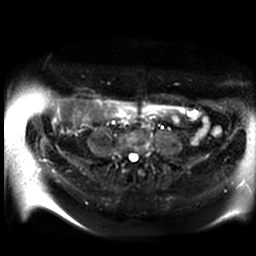
[im 70/70]
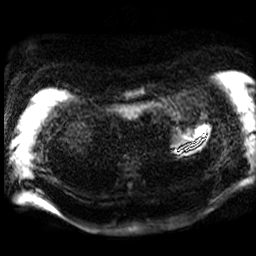

[Series 9: MRCP · coronal · 1.6mm · 0.62mm/px · 3 of 96 slices shown (2 of 2)]
[im 1/96]
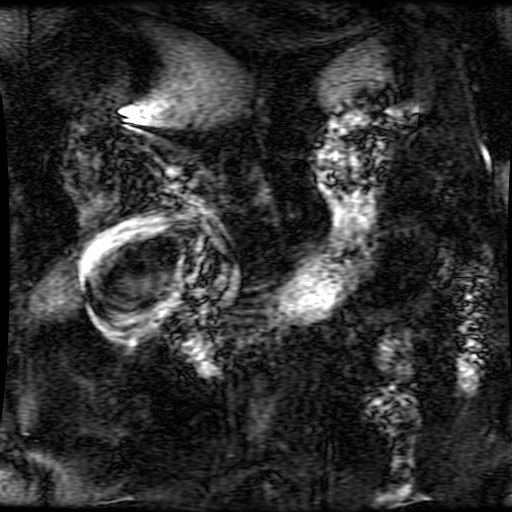
[im 48/96]
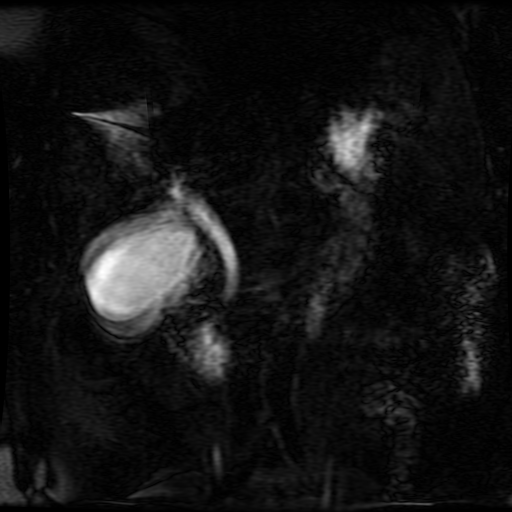
[im 96/96]
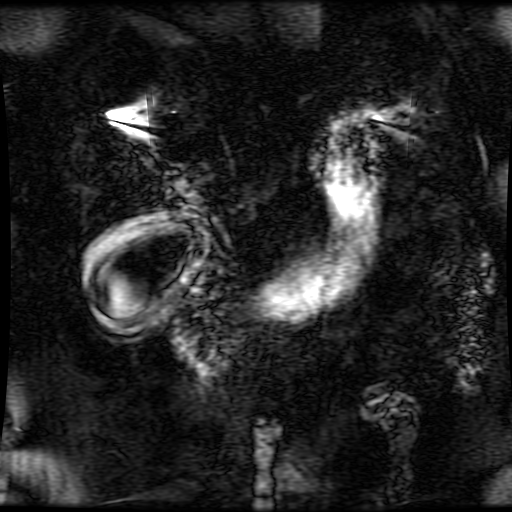

[Series 10: T2 fat-sat · axial · 5.0mm · 0.78mm/px · z∈[-120,+145]mm · 2 of 54 slices shown]
[im 1/54]
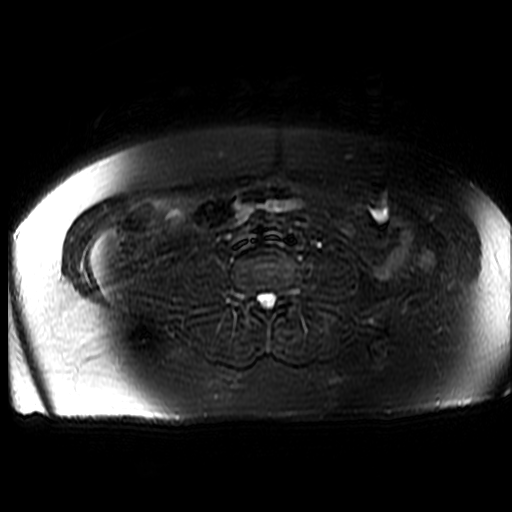
[im 54/54]
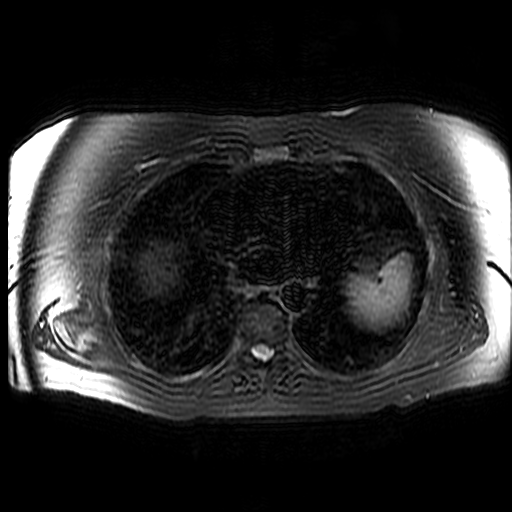

[Series 800: DWI · axial · 6.0mm · 1.48mm/px · 1 of 33 slices shown (1 of 2)]
[im 1/33]
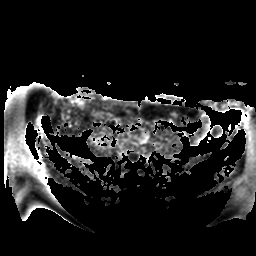

[Series 801: DWI · axial · 6.0mm · 1.48mm/px · 1 of 31 slices shown (2 of 2)]
[im 1/31]
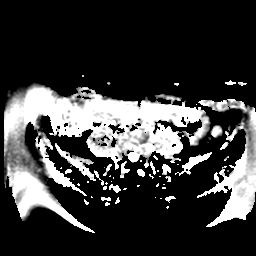

[Series 901: processed images · coronal · 1.6mm · 0.62mm/px · 1 of 13 slices shown]
[im 1/13]
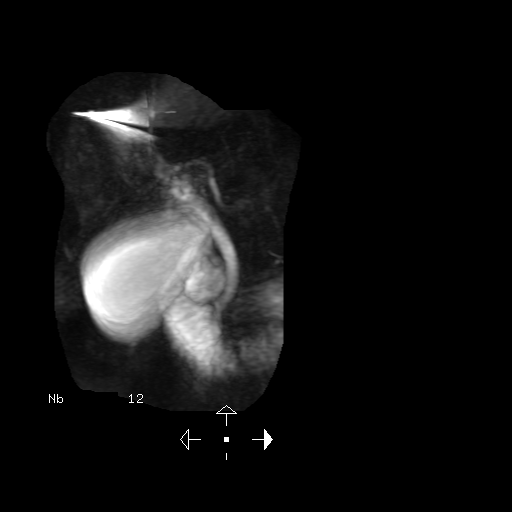

[16 of 16 positions shown; findings below may reference images not displayed]

FINDINGS: Motion degraded scan.

Lower chest: Clear lung bases.

Hepatobiliary: Normal liver size and configuration. No hepatic
steatosis. No liver mass. Distended gallbladder contains several
subcentimeter layering gallstones. No significant gallbladder wall
thickening or pericholecystic fluid. Minimal central intrahepatic
biliary ductal dilatation. Mildly dilated common bile duct. Common
bile duct diameter 8 mm. No evidence of choledocholithiasis on the
motion degraded sequences. No biliary strictures. No appreciable
biliary or ampullary mass.

Pancreas: No pancreatic mass or duct dilation. No evidence of
pancreas divisum.

Spleen: Normal size. No mass.

Adrenals/Urinary Tract: Normal right adrenal. Left adrenal 2.9 cm
nodule is stable since 04/01/2016 and demonstrates prominent loss of
signal intensity on out of phase chemical shift imaging, diagnostic
of a benign adenoma. No hydronephrosis. Normal size kidneys. Simple
appearing subcentimeter renal cyst in the posterior upper left
kidney. No additional renal lesions.

Stomach/Bowel: Grossly normal stomach. Visualized small and large
bowel is normal caliber, with no bowel wall thickening.

Vascular/Lymphatic: Normal caliber abdominal aorta. No
pathologically enlarged lymph nodes in the abdomen.

Other: No abdominal ascites or focal fluid collection.

Musculoskeletal: No aggressive appearing focal osseous lesions.
IMPRESSION: 1. Limited motion degraded scan.
2. Distended gallbladder with cholelithiasis. No specific MR
findings of acute cholecystitis .
3. Minimal central intrahepatic biliary ductal dilatation. Mildly
dilated common bile duct (8 mm diameter). No evidence of
choledocholithiasis.
4. Stable left adrenal adenoma .

## 2018-04-13 ENCOUNTER — Encounter (INDEPENDENT_AMBULATORY_CARE_PROVIDER_SITE_OTHER): Payer: Self-pay | Admitting: Orthopaedic Surgery

## 2018-04-13 ENCOUNTER — Ambulatory Visit (INDEPENDENT_AMBULATORY_CARE_PROVIDER_SITE_OTHER): Payer: BLUE CROSS/BLUE SHIELD | Admitting: Orthopaedic Surgery

## 2018-04-13 ENCOUNTER — Ambulatory Visit (INDEPENDENT_AMBULATORY_CARE_PROVIDER_SITE_OTHER): Payer: Self-pay

## 2018-04-13 DIAGNOSIS — M1712 Unilateral primary osteoarthritis, left knee: Secondary | ICD-10-CM

## 2018-04-13 DIAGNOSIS — M25562 Pain in left knee: Secondary | ICD-10-CM | POA: Diagnosis not present

## 2018-04-13 MED ORDER — LIDOCAINE HCL 1 % IJ SOLN
3.0000 mL | INTRAMUSCULAR | Status: AC | PRN
  Administered 2018-04-13: 3 mL

## 2018-04-13 MED ORDER — METHYLPREDNISOLONE ACETATE 40 MG/ML IJ SUSP
40.0000 mg | INTRAMUSCULAR | Status: AC | PRN
  Administered 2018-04-13: 40 mg via INTRA_ARTICULAR

## 2018-04-13 NOTE — Progress Notes (Signed)
Office Visit Note   Patient: Stephanie Andrade           Date of Birth: 02-Jul-1956           MRN: [id number] Visit Date: 04/13/2018              Requested by: Stephanie Mackintosh, MD [address] [place], [place] [address] PCP: Stephanie Mackintosh, MD   Assessment & Plan: Visit Diagnoses:  1. Acute pain of left knee   2. Unilateral primary osteoarthritis, left knee     Plan: She would definitely benefit from steroid injection in the left knee as well as injection in the pes bursa area.  She understands the risk and benefits of these injections and tolerated them well.  Given the osteoarthritis in her knee and I do feel this is a pain from osteoarthritis, she is definitely a candidate for hyaluronic acid in the left knee.  She was agreeable to this as well so we will order that and see her back in 4 weeks.  All questions concerns were answered and addressed.  Follow-Up Instructions: Return in about 4 weeks (around 05/11/2018).   Orders:  Orders Placed This Encounter  Procedures  . Large Joint Inj  . XR Knee 1-2 Views Left   No orders of the defined types were placed in this encounter.     Procedures: Large Joint Inj: L knee on 04/13/2018 9:00 AM Indications: diagnostic evaluation and pain Details: 22 G 1.5 in needle, superolateral approach  Arthrogram: No  Medications: 3 mL lidocaine 1 %; 40 mg methylPREDNISolone acetate 40 MG/ML Outcome: tolerated well, no immediate complications Procedure, treatment alternatives, risks and benefits explained, specific risks discussed. Consent was given by the patient. Immediately prior to procedure a time out was called to verify the correct patient, procedure, equipment, support staff and site/side marked as required. Patient was prepped and draped in the usual sterile fashion.       Clinical Data: No additional findings.   Subjective: Chief Complaint  Patient presents with  . Left Knee - Pain  The patient is very well-known to  me.  She comes in with several week history of worsening left knee pain.  I will perform bilateral hip arthroplasties under different times.  Her left knee is not been an issue until more recently.  It did hurt when she had her hip arthritis that it flared up and calm down after left hip replacement.  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  She points the medial joint line and the pes bursa area on her left knee as a source of her pain.  It wakes her up at night and is been very painful for her.  HPI  Review of Systems She currently denies any headache, chest pain, shortness of breath, fever, chills, nausea, vomiting.  Objective: Vital Signs: LMP 04/06/2011   Physical Exam She is alert and oriented x3 and in no acute distress Ortho Exam Examination of her left knee shows fluid and full range of motion.  There is no significant effusion.  There is slight varus malalignment.  She has medial joint line tenderness and tenderness over the pes bursa.  The knee feels ligaments is stable. Specialty Comments:  No specialty comments available.  Imaging: Xr Knee 1-2 Views Left  Result Date: 04/13/2018 2  views of the left knee show tricompartmental arthritic changes.  There is medial joint space narrowing with varus malalignment.  There is significant patellofemoral disease.  There are periarticular osteophytes in all 3 compartments.    PMFS History: Patient Active Problem List   Diagnosis Date Noted  . Status post total replacement of left hip 08/24/2017  . Unilateral primary osteoarthritis, left hip 11/30/2016  . Biliary obstruction 09/20/2016  . Cholelithiases 09/20/2016  . Status post total replacement of right hip 03/24/2016  . Osteoarthritis of left hip 11/26/2015  . Impaired glucose tolerance 12/09/2014  . Plantar fascia rupture 06/16/2012  . History of vitamin D  deficiency 11/15/2011  . History of sarcoidosis 11/15/2011  . Hot flashes not due to menopause 07/19/2011  . Spinal stenosis, lumbar region, with neurogenic claudication 03/10/2011  . Hypertension 02/11/2011  . Asthma 02/11/2011  . Morbid obesity (HCC) 02/11/2011  . Depression 02/11/2011  . Cervical radiculopathy 12/04/2010  . SHOULDER PAIN, BILATERAL 10/01/2010  . WRIST PAIN, BILATERAL 10/01/2010  . LUMBAGO 10/01/2010  . Osteoarthritis of right hip 07/17/2009  . GREATER TROCHANTERIC BURSITIS 07/17/2009   Past Medical History:  Diagnosis Date  . Anemia   . Arthritis   . Asthma   . Depression   . Family history of adverse reaction to anesthesia    sister, brother - PONV  . Family history of ovarian cancer   . Fibroids   . GERD (gastroesophageal reflux disease)    occ  . History of bronchitis   . History of sarcoidosis   . Hypertension   . Pneumonia    hx  . PONV (postoperative nausea and vomiting) 03/26/2016  . Wears glasses     Family History  Problem Relation Age of Onset  . Cancer Mother   . Arthritis Father   . Hypertension Father   . Arthritis Sister   . Hypertension Brother   . Breast cancer Maternal Grandfather     Past Surgical History:  Procedure Laterality Date  . CHOLECYSTECTOMY N/A 09/22/2016   Procedure: LAPAROSCOPIC CHOLECYSTECTOMY WITH INTRAOPERATIVE CHOLANGIOGRAM;  Surgeon: Harriette Bouillon, MD;  Location: [facility] OR;  Service: General;  Laterality: N/A;  . COLONOSCOPY    . ESOPHAGOGASTRODUODENOSCOPY    . JOINT REPLACEMENT    . 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   Procedure: RIGHT TOTAL HIP ARTHROPLASTY ANTERIOR APPROACH;  Surgeon: Kathryne Hitch, MD;  Location[facility] OR;  Service: Orthopedics;  Laterality: Right;  . TOTAL HIP ARTHROPLASTY Left 08/24/2017  . TOTAL HIP ARTHROPLASTY Left 08/24/2017   Procedure: LEFT TOTAL HIP ARTHROPLASTY ANTERIOR APPROACH;  Surgeon: Kathryne Hitch, MD;  Location: [facility] OR;  Service: Orthopedics;  Laterality:  Left;   Social History   Occupational History  . Not on file  Tobacco Use  . Smoking status: Former Smoker    Types: Cigarettes    Last attempt to quit: 06/20/2009    Years since quitting: 8.8  . Smokeless tobacco: Never Used  . Tobacco comment: "smoked 2 weeks out of the year for about 15 years"   Substance and Sexual Activity  . Alcohol use: Yes    Comment: socially  . Drug use: No  . Sexual activity: Not on file

## 2018-04-15 ENCOUNTER — Telehealth (INDEPENDENT_AMBULATORY_CARE_PROVIDER_SITE_OTHER): Payer: Self-pay

## 2018-04-15 NOTE — Telephone Encounter (Signed)
Left knee gel injection ?

## 2018-04-20 NOTE — Telephone Encounter (Signed)
Noted  

## 2018-04-22 ENCOUNTER — Telehealth (INDEPENDENT_AMBULATORY_CARE_PROVIDER_SITE_OTHER): Payer: Self-pay

## 2018-04-22 NOTE — Telephone Encounter (Signed)
Submitted VOB for SynviscOne, left knee. 

## 2018-04-29 ENCOUNTER — Telehealth (INDEPENDENT_AMBULATORY_CARE_PROVIDER_SITE_OTHER): Payer: Self-pay

## 2018-04-29 NOTE — Telephone Encounter (Signed)
PA required for SynviscOne, Left Knee. Faxed completed PA form to [facility] at [phone].

## 2018-05-09 ENCOUNTER — Ambulatory Visit (INDEPENDENT_AMBULATORY_CARE_PROVIDER_SITE_OTHER): Payer: BLUE CROSS/BLUE SHIELD | Admitting: Orthopaedic Surgery

## 2018-05-11 ENCOUNTER — Ambulatory Visit (INDEPENDENT_AMBULATORY_CARE_PROVIDER_SITE_OTHER): Payer: BLUE CROSS/BLUE SHIELD | Admitting: Orthopaedic Surgery

## 2018-05-17 ENCOUNTER — Telehealth (INDEPENDENT_AMBULATORY_CARE_PROVIDER_SITE_OTHER): Payer: Self-pay

## 2018-05-17 NOTE — Telephone Encounter (Signed)
Patient is approved for SynviscOne, left knee. [place] Covered at 80% after deductible Patient will be responsible for 20% OOP PA required PA Approval# [id number] Valid 04/29/2018- 04/29/2019  Appt.scheduled 05/18/2018

## 2018-05-18 ENCOUNTER — Ambulatory Visit (INDEPENDENT_AMBULATORY_CARE_PROVIDER_SITE_OTHER): Payer: BLUE CROSS/BLUE SHIELD | Admitting: Orthopaedic Surgery

## 2018-05-23 ENCOUNTER — Other Ambulatory Visit: Payer: Self-pay | Admitting: Internal Medicine

## 2018-05-23 ENCOUNTER — Telehealth: Payer: Self-pay | Admitting: Internal Medicine

## 2018-05-23 MED ORDER — ZOLPIDEM TARTRATE 5 MG PO TABS: 5.0000 mg | ORAL_TABLET | Freq: Every evening | ORAL | 0 refills | Status: DC | PRN

## 2018-05-23 MED ORDER — LOSARTAN POTASSIUM 50 MG PO TABS: 50.0000 mg | ORAL_TABLET | Freq: Every day | ORAL | 0 refills | Status: DC

## 2018-05-23 NOTE — Telephone Encounter (Signed)
Phone call to pt about need for 6 month recall on Ambien refills. Discussed results of CBC and iron studies with pt by phone. She did not get a call back from [place] with results shortly after test was done.

## 2018-05-23 NOTE — Telephone Encounter (Signed)
Left message to call back to make appt.

## 2018-05-23 NOTE — Telephone Encounter (Signed)
Spoke with pt about need for follow up q 6 months. meds refilled x 90 days

## 2018-05-23 NOTE — Telephone Encounter (Signed)
Was here in May. Needs OV in November for 6 month recheck and to continue Ambien. Please call her.

## 2018-05-24 ENCOUNTER — Other Ambulatory Visit: Payer: Self-pay | Admitting: Internal Medicine

## 2018-05-24 NOTE — Telephone Encounter (Signed)
Patient scheduled for November for 6 month recheck.  Araceli sent refills for patient.

## 2018-05-28 ENCOUNTER — Other Ambulatory Visit: Payer: Self-pay | Admitting: Internal Medicine

## 2018-06-01 ENCOUNTER — Ambulatory Visit: Payer: Self-pay | Admitting: Family Medicine

## 2018-06-01 VITALS — BP 120/72 | HR 84 | Temp 99.1°F | Resp 18 | Wt 270.0 lb

## 2018-06-01 DIAGNOSIS — J0110 Acute frontal sinusitis, unspecified: Secondary | ICD-10-CM

## 2018-06-01 DIAGNOSIS — J310 Chronic rhinitis: Secondary | ICD-10-CM

## 2018-06-01 DIAGNOSIS — R300 Dysuria: Secondary | ICD-10-CM

## 2018-06-01 DIAGNOSIS — N3001 Acute cystitis with hematuria: Secondary | ICD-10-CM

## 2018-06-01 LAB — POCT URINALYSIS DIPSTICK
Bilirubin, UA: NEGATIVE
Blood, UA: 50
GLUCOSE UA: NEGATIVE
Ketones, UA: NEGATIVE
Nitrite, UA: NEGATIVE
Protein, UA: POSITIVE — AB
SPEC GRAV UA: 1.02 (ref 1.010–1.025)
Urobilinogen, UA: 0.2 E.U./dL
pH, UA: 5.5 (ref 5.0–8.0)

## 2018-06-01 MED ORDER — AMOXICILLIN-POT CLAVULANATE 875-125 MG PO TABS: 1.0000 | ORAL_TABLET | Freq: Two times a day (BID) | ORAL | 0 refills | Status: AC

## 2018-06-01 MED ORDER — AZELASTINE HCL 0.1 % NA SOLN: 1.0000 | Freq: Two times a day (BID) | NASAL | 0 refills | Status: DC

## 2018-06-01 MED ORDER — FLUTICASONE PROPIONATE 50 MCG/ACT NA SUSP: 2.0000 | Freq: Every day | NASAL | 0 refills | Status: DC

## 2018-06-01 NOTE — Progress Notes (Addendum)
Stephanie Andrade is a 62 y.o. female who presents today with concerns of urinary symptoms for the past 24 hours. She report that she has had an infection before and this is what she felt like, she reports use and Septra at that time found she was allergic to Septra. She reports increased urgency and frequency denies any back pain or fever at this time. Denies similar symptoms or infections in the last 90 days.   She also is concerned about sinus congestion and facial fullness particularly behind her eyes. She reports general fatigue and lower extremity weakness without dizziness or loss of balance. She reports cough, and congestion and occasional ear pain.   Review of Systems  Constitutional: Negative for chills, fever and malaise/fatigue.  HENT: Positive for congestion. Negative for ear discharge, ear pain, sinus pain and sore throat.   Eyes: Negative.   Respiratory: Positive for cough. Negative for sputum production and shortness of breath.   Cardiovascular: Negative.  Negative for chest pain.  Gastrointestinal: Negative for abdominal pain, diarrhea, nausea and vomiting.  Genitourinary: Positive for dysuria, frequency and urgency. Negative for hematuria.  Musculoskeletal: Negative for myalgias.  Skin: Negative.   Neurological: Negative for headaches.  Endo/Heme/Allergies: Negative.   Psychiatric/Behavioral: Negative.     O: Vitals:   06/01/18 0912  BP: 120/72  Pulse: 84  Resp: 18  Temp: 99.1 F (37.3 C)  SpO2: 97%     Physical Exam  Constitutional: She is oriented to person, place, and time. Vital signs are normal. She appears well-developed and well-nourished. She is active.  Non-toxic appearance. She does not have a sickly appearance.  HENT:  Head: Normocephalic.  Right Ear: Hearing, tympanic membrane, external ear and ear canal normal.  Left Ear: Hearing, tympanic membrane, external ear and ear canal normal.  Nose: Rhinorrhea present. Right sinus exhibits maxillary sinus  tenderness and frontal sinus tenderness. Left sinus exhibits maxillary sinus tenderness and frontal sinus tenderness.  Mouth/Throat: Uvula is midline. Posterior oropharyngeal erythema present.  Neck: Normal range of motion. Neck supple.  Cardiovascular: Normal rate, regular rhythm, normal heart sounds and normal pulses.  Pulmonary/Chest: Effort normal. She has rales in the right lower field and the left lower field.  Abdominal: Soft. Bowel sounds are normal. There is tenderness in the suprapubic area. There is CVA tenderness.  Musculoskeletal: Normal range of motion.  Lymphadenopathy:       Head (right side): Tonsillar adenopathy present. No submental and no submandibular adenopathy present.       Head (left side): Tonsillar adenopathy present. No submental and no submandibular adenopathy present.    She has cervical adenopathy.  Neurological: She is alert and oriented to person, place, and time.  Psychiatric: She has a normal mood and affect.  Vitals reviewed.   A: 1. Acute cystitis with hematuria   2. Acute non-recurrent frontal sinusitis   3. Dysuria     P: Discussed exam findings, diagnosis etiology and medication use and indications reviewed with patient. Follow- Up and discharge instructions provided. No emergent/urgent issues found on exam.  Patient verbalized understanding of information provided and agrees with plan of care (POC), all questions answered.  Attempting to treat both conditions with 1 antibiotic- advised to follow up if condition worsens or does not improve- patient does have a PCP who was just out of office today.  1. Acute cystitis with hematuria - amoxicillin-clavulanate (AUGMENTIN) 875-125 MG tablet; Take 1 tablet by mouth 2 (two) times daily for 5 days.  2. Acute non-recurrent  frontal sinusitis - amoxicillin-clavulanate (AUGMENTIN) 875-125 MG tablet; Take 1 tablet by mouth 2 (two) times daily for 5 days.  3. Dysuria - POCT urinalysis dipstick Results  for orders placed or performed in visit on 06/01/18 (from the past 24 hour(s))  POCT urinalysis dipstick     Status: Abnormal   Collection Time: 06/01/18  9:19 AM  Result Value Ref Range   Color, UA LT YELLOW    Clarity, UA CLEAR    Glucose, UA Negative Negative   Bilirubin, UA negative    Ketones, UA negative    Spec Grav, UA 1.020 1.010 - 1.025   Blood, UA 50    pH, UA 5.5 5.0 - 8.0   Protein, UA Positive (A) Negative   Urobilinogen, UA 0.2 0.2 or 1.0 E.U./dL   Nitrite, UA negative    Leukocytes, UA Moderate (2+) (A) Negative   Appearance     Odor       Other orders - Pseudoephedrine-Ibuprofen (ADVIL COLD/SINUS) 30-200 MG TABS; Take by mouth.

## 2018-06-01 NOTE — Patient Instructions (Signed)
Urinary Tract Infection, Adult A urinary tract infection (UTI) is an infection of any part of the urinary tract. The urinary tract includes the:  Kidneys.  Ureters.  Bladder.  Urethra.  These organs make, store, and get rid of pee (urine) in the body. Follow these instructions at home:  Take over-the-counter and prescription medicines only as told by your doctor.  If you were prescribed an antibiotic medicine, take it as told by your doctor. Do not stop taking the antibiotic even if you start to feel better.  Avoid the following drinks: ? Alcohol. ? Caffeine. ? Tea. ? Carbonated drinks.  Drink enough fluid to keep your pee clear or pale yellow.  Keep all follow-up visits as told by your doctor. This is important.  Make sure to: ? Empty your bladder often and completely. Do not to hold pee for long periods of time. ? Empty your bladder before and after sex. ? Wipe from front to back after a bowel movement if you are female. Use each tissue one time when you wipe. Contact a doctor if:  You have back pain.  You have a fever.  You feel sick to your stomach (nauseous).  You throw up (vomit).  Your symptoms do not get better after 3 days.  Your symptoms go away and then come back. Get help right away if:  You have very bad back pain.  You have very bad lower belly (abdominal) pain.  You are throwing up and cannot keep down any medicines or water. This information is not intended to replace advice given to you by your health care provider. Make sure you discuss any questions you have with your health care provider. Document Released: 01/20/2008 Document Revised: 01/09/2016 Document Reviewed: 06/24/2015 Elsevier Interactive Patient Education  2018 [employer]. Nonallergic Rhinitis Nonallergic rhinitis is a condition that causes symptoms that affect the nose, such as a runny nose and a stuffed-up nose (nasal congestion) that can make it hard to breathe through the  nose. This condition is different from having an allergy (allergic rhinitis). Allergic rhinitis occurs when the body's defense system (immune system) reacts to a substance that you are allergic to (allergen), such as pollen, pet dander, mold, or dust. Nonallergic rhinitis has many similar symptoms, but it is not caused by allergens. Nonallergic rhinitis can be a short-term or long-term problem. What are the causes? This condition can be caused by many different things. Some common types of nonallergic rhinitis include: Infectious rhinitis  This is usually due to an infection in the upper respiratory tract. Vasomotor rhinitis  This is the most common type of long-term nonallergic rhinitis.  It is caused by too much blood flow through the nose, which makes the tissue inside of the nose swell.  Symptoms are often triggered by strong odors, cold air, stress, drinking alcohol, cigarette smoke, or changes in the weather. Occupational rhinitis  This type is caused by triggers in the workplace, such as chemicals, dusts, animal dander, or air pollution. Hormonal rhinitis  This type occurs in women as a result of an increase in the female hormone estrogen.  It may occur during pregnancy, puberty, and menstrual cycles.  Symptoms improve when estrogen levels drop. Drug-induced rhinitis Several drugs can cause nonallergic rhinitis, including:  Medicines that are used to treat high blood pressure, heart disease, and Parkinson disease.  Aspirin and NSAIDs.  Over-the-counter nasal decongestant sprays. These can cause a type of nonallergic rhinitis (rhinitis medicamentosa) when they are used for more than a  few days.  Nonallergic rhinitis with eosinophilia syndrome (NARES)  This type is caused by having too much of a certain type of white blood cell (eosinophil). Nonallergic rhinitis can also be caused by a reaction to eating hot or spicy foods. This does not usually cause long-term symptoms. In  some cases, the cause of nonallergic rhinitis is not known. What increases the risk? You are more likely to develop this condition if:  You are 95-69 years of age.  You are a woman. Women are twice as likely to have this condition.  What are the signs or symptoms? Common symptoms of this condition include:  Nasal congestion.  Runny nose.  The feeling of mucus going down the back of the throat (postnasal drip).  Trouble sleeping at night and daytime sleepiness.  Less common symptoms include:  Sneezing.  Coughing.  Itchy nose.  Bloodshot eyes.  How is this diagnosed? This condition may be diagnosed based on:  Your symptoms and medical history.  A physical exam.  Allergy testing to rule out allergic rhinitis. You may have skin tests or blood tests.  In some cases, the health care provider may take a swab of nasal secretions to look for an increased number of eosinophils. This would be done to confirm a diagnosis of NARES. How is this treated? Treatment for this condition depends on the cause. No single treatment works for everyone. Work with your health care provider to find the best treatment for you. Treatment may include:  Avoiding the things that trigger your symptoms.  Using medicines to relieve congestion, such as: ? Steroid nasal spray. There are many types. You may need to try a few to find out which one works best. ? Decongestant medicine. This may be an oral medicine or a nasal spray. These medicines are only used for a short time.  Using medicines to relieve a runny nose. These may include antihistamine medicines or anticholinergic nasal sprays.  Surgery to remove tissue from inside the nose may be needed in severe cases if the condition has not improved after 6-12 months of medical treatment. Follow these instructions at home:  Take or use over-the-counter and prescription medicines only as told by your health care provider. Do not stop using your  medicine even if you start to feel better.  Use salt-water (saline) rinses or other solutions (nasal washes or irrigations) to wash or rinse out the inside of your nose as told by your health care provider.  Do not take NSAIDs or medicines that contain aspirin if they make your symptoms worse.  Do not drink alcohol if it makes your symptoms worse.  Do not use any tobacco products, such as cigarettes, chewing tobacco, and e-cigarettes. If you need help quitting, ask your health care provider.  Avoid secondhand smoke.  Get some exercise every day. Exercise may help reduce symptoms of nonallergic rhinitis for some people. Ask your health care provider how much exercise and what types of exercise are safe for you.  Sleep with the head of your bed raised (elevated). This may reduce nighttime nasal congestion.  Keep all follow-up visits as told by your health care provider. This is important. Contact a health care provider if:  You have a fever.  Your symptoms are getting worse at home.  Your symptoms are not responding to medicine.  You develop new symptoms, especially a headache or nosebleed. This information is not intended to replace advice given to you by your health care provider. Make sure you  discuss any questions you  have with your health care provider.  Sinusitis, Adult Sinusitis is soreness and inflammation of your sinuses. Sinuses are hollow spaces in the bones around your face. Your sinuses are located:  Around your eyes.  In the middle of your forehead.  Behind your nose.  In your cheekbones.  Your sinuses and nasal passages are lined with a stringy fluid (mucus). Mucus normally drains out of your sinuses. When your nasal tissues become inflamed or swollen, the mucus can become trapped or blocked so air cannot flow through your sinuses. This allows bacteria, viruses, and funguses to grow, which leads to infection. Sinusitis can develop quickly and last for 7?10 days  (acute) or for more than 12 weeks (chronic). Sinusitis often develops after a cold. What are the causes? This condition is caused by anything that creates swelling in the sinuses or stops mucus from draining, including:  Allergies.  Asthma.  Bacterial or viral infection.  Abnormally shaped bones between the nasal passages.  Nasal growths that contain mucus (nasal polyps).  Narrow sinus openings.  Pollutants, such as chemicals or irritants in the air.  A foreign object stuck in the nose.  A fungal infection. This is rare.  What increases the risk? The following factors may make you more likely to develop this condition:  Having allergies or asthma.  Having had a recent cold or respiratory tract infection.  Having structural deformities or blockages in your nose or sinuses.  Having a weak immune system.  Doing a lot of swimming or diving.  Overusing nasal sprays.  Smoking.  What are the signs or symptoms? The main symptoms of this condition are pain and a feeling of pressure around the affected sinuses. Other symptoms include:  Upper toothache.  Earache.  Headache.  Bad breath.  Decreased sense of smell and taste.  A cough that may get worse at night.  Fatigue.  Fever.  Thick drainage from your nose. The drainage is often green and it may contain pus (purulent).  Stuffy nose or congestion.  Postnasal drip. This is when extra mucus collects in the throat or back of the nose.  Swelling and warmth over the affected sinuses.  Sore throat.  Sensitivity to light.  How is this diagnosed? This condition is diagnosed based on symptoms, a medical history, and a physical exam. To find out if your condition is acute or chronic, your health care provider may:  Look in your nose for signs of nasal polyps.  Tap over the affected sinus to check for signs of infection.  View the inside of your sinuses using an imaging device that has a light attached  (endoscope).  If your health care provider suspects that you have chronic sinusitis, you may also:  Be tested for allergies.  Have a sample of mucus taken from your nose (nasal culture) and checked for bacteria.  Have a mucus sample examined to see if your sinusitis is related to an allergy.  If your sinusitis does not respond to treatment and it lasts longer than 8 weeks, you may have an MRI or CT scan to check your sinuses. These scans also help to determine how severe your infection is. In rare cases, a bone biopsy may be done to rule out more serious types of fungal sinus disease. How is this treated? Treatment for sinusitis depends on the cause and whether your condition is chronic or acute. If a virus is causing your sinusitis, your symptoms will go away on their  own within 10 days. You may be given medicines to relieve your symptoms, including:  Topical nasal decongestants. They shrink swollen nasal passages and let mucus drain from your sinuses.  Antihistamines. These drugs block inflammation that is triggered by allergies. This can help to ease swelling in your nose and sinuses.  Topical nasal corticosteroids. These are nasal sprays that ease inflammation and swelling in your nose and sinuses.  Nasal saline washes. These rinses can help to get rid of thick mucus in your nose.  If your condition is caused by bacteria, you will be given an antibiotic medicine. If your condition is caused by a fungus, you will be given an antifungal medicine. Surgery may be needed to correct underlying conditions, such as narrow nasal passages. Surgery may also be needed to remove polyps. Follow these instructions at home: Medicines  Take, use, or apply over-the-counter and prescription medicines only as told by your health care provider. These may include nasal sprays.  If you were prescribed an antibiotic medicine, take it as told by your health care provider. Do not stop taking the antibiotic  even if you start to feel better. Hydrate and Humidify  Drink enough water to keep your urine clear or pale yellow. Staying hydrated will help to thin your mucus.  Use a cool mist humidifier to keep the humidity level in your home above 50%.  Inhale steam for 10-15 minutes, 3-4 times a day or as told by your health care provider. You can do this in the bathroom while a hot shower is running.  Limit your exposure to cool or dry air. Rest  Rest as much as possible.  Sleep with your head raised (elevated).  Make sure to get enough sleep each night. General instructions  Apply a warm, moist washcloth to your face 3-4 times a day or as told by your health care provider. This will help with discomfort.  Wash your hands often with soap and water to reduce your exposure to viruses and other germs. If soap and water are not available, use hand sanitizer.  Do not smoke. Avoid being around people who are smoking (secondhand smoke).  Keep all follow-up visits as told by your health care provider. This is important. Contact a health care provider if:  You have a fever.  Your symptoms get worse.  Your symptoms do not improve within 10 days. Get help right away if:  You have a severe headache.  You have persistent vomiting.  You have pain or swelling around your face or eyes.  You have vision problems.  You develop confusion.  Your neck is stiff.  You have trouble breathing. This information is not intended to replace advice given to you by your health care provider. Make sure you discuss any questions you have with your health care provider. Document Released: 08/03/2005 Document Revised: 03/29/2016 Document Reviewed: 05/29/2015 Elsevier Interactive Patient Education  2018 [place] Released: 11/25/2015 Document Revised: 01/09/2016 Document Reviewed: 10/24/2015 Elsevier Interactive Patient Education  2018 [employer].

## 2018-06-03 MED ORDER — FLUCONAZOLE 150 MG PO TABS: 150.0000 mg | ORAL_TABLET | Freq: Once | ORAL | 0 refills | Status: AC

## 2018-06-03 NOTE — Addendum Note (Signed)
Addended by: Shella Maxim on: 06/03/2018 10:01 AM   Modules accepted: Orders

## 2018-06-03 NOTE — Progress Notes (Signed)
Will provide Fluconazole because patient reports that she usually gets yeast infections and is going out of town for the next 3 weeks and leaving on Monday 06/06/2018.

## 2018-06-23 ENCOUNTER — Other Ambulatory Visit: Payer: Self-pay | Admitting: Internal Medicine

## 2018-06-23 DIAGNOSIS — R7302 Impaired glucose tolerance (oral): Secondary | ICD-10-CM

## 2018-06-24 ENCOUNTER — Other Ambulatory Visit: Payer: BLUE CROSS/BLUE SHIELD | Admitting: Internal Medicine

## 2018-06-24 DIAGNOSIS — R7302 Impaired glucose tolerance (oral): Secondary | ICD-10-CM

## 2018-06-25 LAB — MICROALBUMIN / CREATININE URINE RATIO
CREATININE, URINE: 118 mg/dL (ref 20–275)
Microalb Creat Ratio: 3 mcg/mg creat (ref <30)
Microalb, Ur: 0.3 mg/dL

## 2018-06-25 LAB — HEMOGLOBIN A1C
HEMOGLOBIN A1C: 5.6 %{Hb} (ref <5.7)
MEAN PLASMA GLUCOSE: 114 (calc)
eAG (mmol/L): 6.3 (calc)

## 2018-06-28 ENCOUNTER — Ambulatory Visit (INDEPENDENT_AMBULATORY_CARE_PROVIDER_SITE_OTHER): Payer: BLUE CROSS/BLUE SHIELD | Admitting: Internal Medicine

## 2018-06-28 ENCOUNTER — Encounter: Payer: Self-pay | Admitting: Internal Medicine

## 2018-06-28 VITALS — BP 102/70 | HR 89 | Ht 66.0 in | Wt 261.0 lb

## 2018-06-28 DIAGNOSIS — G4709 Other insomnia: Secondary | ICD-10-CM | POA: Diagnosis not present

## 2018-06-28 DIAGNOSIS — Z8659 Personal history of other mental and behavioral disorders: Secondary | ICD-10-CM | POA: Diagnosis not present

## 2018-06-28 DIAGNOSIS — R7302 Impaired glucose tolerance (oral): Secondary | ICD-10-CM

## 2018-06-28 DIAGNOSIS — I10 Essential (primary) hypertension: Secondary | ICD-10-CM | POA: Diagnosis not present

## 2018-06-28 NOTE — Progress Notes (Signed)
Subjective:    Patient ID: Stephanie Andrade, female    DOB: 1955-09-02, 62 y.o.   MRN: [id number]  HPI 62 year old Female in today for six-month follow-up. She has impaired glucose tolerance, hypertension, depression treated with Wellbutrin, insomnia treated with Ambien. She saw Dr. Magnus Ivan for steroid injection in the left knee in August.  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  Says things are going well at work and in her personal life.  No new complaints or problems.  Review of Systems see above     Objective:   Physical Exam Neck supple.  Chest clear.  Cardiac exam regular rate and rhythm normal S1 and S2.  Extremities without edema.       Assessment & Plan:  Impaired glucose tolerance-hemoglobin A1c stable at 5.6% and previously 6 months ago was 5.8%.  Insomnia treated with Ambien 5 mg at bedtime  Depression treated with Wellbutrin  Essential hypertension treated with losartan 50 mg daily.  Plan: Recommend Dr. Francena Hanly [facility] for weight loss.  Okay to refill Ambien for 6 months.  Continue Wellbutrin and losartan.  Physical exam due in 6 months.

## 2018-07-07 ENCOUNTER — Ambulatory Visit (INDEPENDENT_AMBULATORY_CARE_PROVIDER_SITE_OTHER): Payer: BLUE CROSS/BLUE SHIELD | Admitting: Orthopaedic Surgery

## 2018-07-10 NOTE — Patient Instructions (Signed)
Continue Ambien 5 mg at bedtime.  Continue losartan for hypertension.  Consider Dr. Migdalia Dk clinic for weight loss.  Continue to watch diet due to impaired glucose tolerance.  Return in 6 months for physical exam.  It was a pleasure to see you today.

## 2018-07-18 ENCOUNTER — Ambulatory Visit (INDEPENDENT_AMBULATORY_CARE_PROVIDER_SITE_OTHER): Payer: BLUE CROSS/BLUE SHIELD | Admitting: Orthopaedic Surgery

## 2018-07-18 ENCOUNTER — Encounter (INDEPENDENT_AMBULATORY_CARE_PROVIDER_SITE_OTHER): Payer: Self-pay | Admitting: Orthopaedic Surgery

## 2018-07-18 DIAGNOSIS — Z96641 Presence of right artificial hip joint: Secondary | ICD-10-CM | POA: Diagnosis not present

## 2018-07-18 DIAGNOSIS — Z96642 Presence of left artificial hip joint: Secondary | ICD-10-CM | POA: Diagnosis not present

## 2018-07-18 DIAGNOSIS — M1712 Unilateral primary osteoarthritis, left knee: Secondary | ICD-10-CM | POA: Diagnosis not present

## 2018-07-18 MED ORDER — GABAPENTIN 300 MG PO CAPS: 300.0000 mg | ORAL_CAPSULE | Freq: Three times a day (TID) | ORAL | 1 refills | Status: DC | PRN

## 2018-07-18 MED ORDER — DICLOFENAC SODIUM 1 % TD GEL: 2.0000 g | Freq: Four times a day (QID) | TRANSDERMAL | 3 refills | Status: AC

## 2018-07-18 NOTE — Progress Notes (Signed)
The patient is well-known to me.  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  She is moderately to morbidly obese.  She also has known arthritis of her left knee.  She was scheduled to have a Synvisc 1 injection today but says right now she does not need an injection.  She still feels like she may have a leg length discrepancy and her back bothers her when she walks.  On exam her left knee is definitely an issue with medial joint line tenderness and medial compartment arthritic changes.  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  When I have her lay supine her leg lengths are equal.  I explained this to her as well and she is agreeable to this.  At this point we will hold off on any other intervention to see if she is at a stable baseline for now.  All question concerns were answered and addressed.  Follow-up will be as needed.

## 2018-08-05 ENCOUNTER — Encounter: Payer: Self-pay | Admitting: Physician Assistant

## 2018-08-05 ENCOUNTER — Ambulatory Visit: Payer: Self-pay | Admitting: Physician Assistant

## 2018-08-05 VITALS — BP 115/85 | HR 81 | Temp 98.5°F | Resp 16 | Wt 269.8 lb

## 2018-08-05 DIAGNOSIS — R300 Dysuria: Secondary | ICD-10-CM

## 2018-08-05 LAB — POCT URINALYSIS DIPSTICK
Bilirubin, UA: NEGATIVE
Bilirubin, UA: NEGATIVE
Blood, UA: NEGATIVE
Blood, UA: NEGATIVE
Glucose, UA: NEGATIVE
Glucose, UA: POSITIVE — AB
KETONES UA: NEGATIVE
Ketones, UA: NEGATIVE
Leukocytes, UA: NEGATIVE
Leukocytes, UA: NEGATIVE
Nitrite, UA: NEGATIVE
Nitrite, UA: POSITIVE
Protein, UA: NEGATIVE
Protein, UA: NEGATIVE
Spec Grav, UA: 1.02 (ref 1.010–1.025)
Spec Grav, UA: 1.02 (ref 1.010–1.025)
Urobilinogen, UA: 1 E.U./dL
Urobilinogen, UA: 1 E.U./dL
pH, UA: 5 (ref 5.0–8.0)
pH, UA: 5 (ref 5.0–8.0)

## 2018-08-05 MED ORDER — CEPHALEXIN 500 MG PO CAPS: 500.0000 mg | ORAL_CAPSULE | Freq: Two times a day (BID) | ORAL | 0 refills | Status: AC

## 2018-08-05 NOTE — Patient Instructions (Signed)
Your results indicate you have a UTI. I have given you a prescription for an antibiotic. Please take with food. I have sent off a urine culture and we should have those results in 48 hours. If your symptoms worsen while you are awaiting these results or you develop fever, chills, flank pain, nausea and vomiting, please seek care immediately. If no improvement in 48 hours, follow up with PCP. Thank you for letting me participate in your health and well being.  Urinary Tract Infection, Adult A urinary tract infection (UTI) is an infection of any part of the urinary tract. The urinary tract includes:  The kidneys.  The ureters.  The bladder.  The urethra. These organs make, store, and get rid of pee (urine) in the body. What are the causes? This is caused by germs (bacteria) in your genital area. These germs grow and cause swelling (inflammation) of your urinary tract. What increases the risk? You are more likely to develop this condition if:  You have a small, thin tube (catheter) to drain pee.  You cannot control when you pee or poop (incontinence).  You are female, and: ? You use these methods to prevent pregnancy: ? A medicine that kills sperm (spermicide). ? A device that blocks sperm (diaphragm). ? You have low levels of a female hormone (estrogen). ? You are pregnant.  You have genes that add to your risk.  You are sexually active.  You take antibiotic medicines.  You have trouble peeing because of: ? A prostate that is bigger than normal, if you are female. ? A blockage in the part of your body that drains pee from the bladder (urethra). ? A kidney stone. ? A nerve condition that affects your bladder (neurogenic bladder). ? Not getting enough to drink. ? Not peeing often enough.  You have other conditions, such as: ? Diabetes. ? A weak disease-fighting system (immune system). ? Sickle cell disease. ? Gout. ? Injury of the spine. What are the signs or  symptoms? Symptoms of this condition include:  Needing to pee right away (urgently).  Peeing often.  Peeing small amounts often.  Pain or burning when peeing.  Blood in the pee.  Pee that smells bad or not like normal.  Trouble peeing.  Pee that is cloudy.  Fluid coming from the vagina, if you are female.  Pain in the belly or lower back. Other symptoms include:  Throwing up (vomiting).  No urge to eat.  Feeling mixed up (confused).  Being tired and grouchy (irritable).  A fever.  Watery poop (diarrhea). How is this treated? This condition may be treated with:  Antibiotic medicine.  Other medicines.  Drinking enough water. Follow these instructions at home:  Medicines  Take over-the-counter and prescription medicines only as told by your doctor.  If you were prescribed an antibiotic medicine, take it as told by your doctor. Do not stop taking it even if you start to feel better. General instructions  Make sure you: ? Pee until your bladder is empty. ? Do not hold pee for a long time. ? Empty your bladder after sex. ? Wipe from front to back after pooping if you are a female. Use each tissue one time when you wipe.  Drink enough fluid to keep your pee pale yellow.  Keep all follow-up visits as told by your doctor. This is important. Contact a doctor if:  You do not get better after 1-2 days.  Your symptoms go away and then come back.  Get help right away if:  You have very bad back pain.  You have very bad pain in your lower belly.  You have a fever.  You are sick to your stomach (nauseous).  You are throwing up. Summary  A urinary tract infection (UTI) is an infection of any part of the urinary tract.  This condition is caused by germs in your genital area.  There are many risk factors for a UTI. These include having a small, thin tube to drain pee and not being able to control when you pee or poop.  Treatment includes antibiotic  medicines for germs.  Drink enough fluid to keep your pee pale yellow. This information is not intended to replace advice given to you by your health care provider. Make sure you discuss any questions you have with your health care provider. Document Released: 01/20/2008 Document Revised: 02/10/2018 Document Reviewed: 02/10/2018 Elsevier Interactive Patient Education  2019 [employer].

## 2018-08-05 NOTE — Progress Notes (Signed)
08/05/2018 at 11:29 AM  Stephanie Andrade / DOB: May 25, 1956 / MRN: [id number]  The patient has Osteoarthritis of right hip; GREATER TROCHANTERIC BURSITIS; SHOULDER PAIN, BILATERAL; WRIST PAIN, BILATERAL; LUMBAGO; Cervical radiculopathy; Hypertension; Asthma; Morbid obesity (HCC); Depression; Spinal stenosis, lumbar region, with neurogenic claudication; Hot flashes not due to menopause; History of vitamin D deficiency; History of sarcoidosis; Plantar fascia rupture; Impaired glucose tolerance; Osteoarthritis of left hip; Status post total replacement of right hip; Biliary obstruction; Cholelithiases; Unilateral primary osteoarthritis, left hip; and Status post total replacement of left hip on their problem list.  SUBJECTIVE  Stephanie Andrade is a 62 y.o. female who complains of dysuria, urinary frequency and urinary urgency x 2 days. She denies hematuria, flank pain, abdominal pain, pelvic pain, cloudy malordorous urine, genital rash, genital irritation and vaginal discharge. Has tried azo with relief of burning.Had one left over amoxicillin that she took today. Most recent UTI prior to this was 2 months ago. She is sexually active with monogamous partner. No PMH of DM, last A1C one month ago 5.6. No PMH of kidney stones or pyelonephritis.   She  has a past medical history of Anemia, Arthritis, Asthma, Depression, Family history of adverse reaction to anesthesia, Family history of ovarian cancer, Fibroids, GERD (gastroesophageal reflux disease), History of bronchitis, History of sarcoidosis, Hypertension, Pneumonia, PONV (postoperative nausea and vomiting) (03/26/2016), and Wears glasses.    Medications reviewed and updated by myself where necessary, and exist elsewhere in the encounter.   [name] is allergic to mushroom extract complex; codeine; dextromethorphan; levaquin [levofloxacin in d5w]; macrobid  [nitrofurantoin macrocrystal]; tape; tequila sunrise flavor; and tessalon perles. She  reports  that she quit smoking about 9 years ago. Her smoking use included cigarettes. She has never used smokeless tobacco. She reports current alcohol use. She reports that she does not use drugs. She  has no history on file for sexual activity. The patient  has a past surgical history that includes Colonoscopy; Esophagogastroduodenoscopy; Total hip arthroplasty (Right, 03/24/2016); Joint replacement; Cholecystectomy (N/A, 09/22/2016); Total hip arthroplasty (Left, 08/24/2017); and Total hip arthroplasty (Left, 08/24/2017).  Her family history includes Arthritis in her father and sister; Breast cancer in her maternal grandfather; Cancer in her mother; Hypertension in her brother and father.  Review of Systems  Constitutional: Negative for chills and fever.  Gastrointestinal: Negative for abdominal pain, nausea and vomiting.    OBJECTIVE  Her  weight is 269 lb 12.8 oz (122.4 kg). Her oral temperature is 98.5 F (36.9 C). Her blood pressure is 115/85 and her pulse is 81. Her respiration is 16 and oxygen saturation is 99%.  The patient's body mass index is 43.55 kg/m.  Physical Exam Vitals signs reviewed.  Constitutional:      General: She is not in acute distress.    Appearance: Normal appearance. She is well-developed.  HENT:     Head: Normocephalic and atraumatic.  Eyes:     Conjunctiva/sclera: Conjunctivae normal.  Neck:     Musculoskeletal: Normal range of motion.  Pulmonary:     Effort: Pulmonary effort is normal.  Abdominal:     Palpations: Abdomen is soft.     Tenderness: There is no abdominal tenderness. There is no right CVA tenderness or left CVA tenderness.  Skin:    General: Skin is warm and dry.  Neurological:     Mental Status: She is alert and oriented to person, place, and time.     Results for orders placed or performed in visit on  08/05/18 (from the past 24 hour(s))  POCT urinalysis dipstick     Status: Normal   Collection Time: 08/05/18 11:00 AM  Result Value Ref Range    Color, UA ORANGE    Clarity, UA CLEAR    Glucose, UA Negative Negative   Bilirubin, UA NEGATIVE    Ketones, UA NEGATIVE    Spec Grav, UA 1.020 1.010 - 1.025   Blood, UA NEGATIVE    pH, UA 5.0 5.0 - 8.0   Protein, UA Negative Negative   Urobilinogen, UA 1.0 0.2 or 1.0 E.U./dL   Nitrite, UA NEGATIVE    Leukocytes, UA Negative Negative   Appearance     Odor    POCT urinalysis dipstick     Status: Abnormal   Collection Time: 08/05/18 11:08 AM  Result Value Ref Range   Color, UA DK ORANGE    Clarity, UA CLEAR    Glucose, UA Positive (A) Negative   Bilirubin, UA negative    Ketones, UA negative    Spec Grav, UA 1.020 1.010 - 1.025   Blood, UA negative    pH, UA 5.0 5.0 - 8.0   Protein, UA Negative Negative   Urobilinogen, UA 1.0 0.2 or 1.0 E.U./dL   Nitrite, UA positive    Leukocytes, UA Negative Negative   Appearance     Odor      ASSESSMENT & PLAN  Torryn was seen today for dysuria and back pain.  Diagnoses and all orders for this visit:  Dysuria -     POCT urinalysis dipstick -     POCT urinalysis dipstick  Other orders -     cephALEXin (KEFLEX) 500 MG capsule; Take 1 capsule (500 mg total) by mouth 2 (two) times daily for 7 days.    Hx consistent with UTI. 2 POC UA were performed with conflicting results. Based on clinical presentation, will treat empirically at this time. Due to listed drug allergies/intolerances, will treat with keflex.   Due to recurrent infection within 3 months, pt will need to follow up with PCP for any future recurrence, as she will likely warrant urine cx. Will also need to follow up with PCP or local urgent care if sx do not improve within 48 hours, sx worsen, or she develops any new concerning sx. Pt voices understanding.   Benjiman Core, Cordelia Poche  [facility] 08/05/2018 11:29 AM

## 2018-08-11 ENCOUNTER — Other Ambulatory Visit: Payer: Self-pay | Admitting: Internal Medicine

## 2018-09-07 ENCOUNTER — Other Ambulatory Visit: Payer: Self-pay | Admitting: Internal Medicine

## 2018-09-27 DIAGNOSIS — L820 Inflamed seborrheic keratosis: Secondary | ICD-10-CM | POA: Diagnosis not present

## 2018-09-27 DIAGNOSIS — L718 Other rosacea: Secondary | ICD-10-CM | POA: Diagnosis not present

## 2018-09-27 DIAGNOSIS — L281 Prurigo nodularis: Secondary | ICD-10-CM | POA: Diagnosis not present

## 2018-09-27 DIAGNOSIS — D485 Neoplasm of uncertain behavior of skin: Secondary | ICD-10-CM | POA: Diagnosis not present

## 2018-09-27 DIAGNOSIS — L680 Hirsutism: Secondary | ICD-10-CM | POA: Diagnosis not present

## 2018-09-27 DIAGNOSIS — L821 Other seborrheic keratosis: Secondary | ICD-10-CM | POA: Diagnosis not present

## 2018-09-28 ENCOUNTER — Ambulatory Visit (INDEPENDENT_AMBULATORY_CARE_PROVIDER_SITE_OTHER): Payer: BLUE CROSS/BLUE SHIELD | Admitting: Sports Medicine

## 2018-09-28 ENCOUNTER — Ambulatory Visit: Payer: Self-pay

## 2018-09-28 ENCOUNTER — Encounter: Payer: Self-pay | Admitting: Sports Medicine

## 2018-09-28 VITALS — BP 128/73 | Ht 68.0 in | Wt 260.0 lb

## 2018-09-28 DIAGNOSIS — M25561 Pain in right knee: Secondary | ICD-10-CM | POA: Diagnosis not present

## 2018-09-28 DIAGNOSIS — M7121 Synovial cyst of popliteal space [Baker], right knee: Secondary | ICD-10-CM

## 2018-09-28 NOTE — Progress Notes (Signed)
HPI  CC: Right knee pain  Stephanie Andrade is a 63 year old female who presents for right knee pain.  She states the pain is in the posterior side of her knee.  She states she noticed the pain first yesterday when she was coming down stairs.  She states that she took a step and felt a sudden sharp pop sensation in the back of her knee.  She states she has been had difficulty with flexing her knee since that time.  She has been on crutches in the interim.  She has been taking Percocet sparingly since that time.  She is also been taken Motrin 40 mg daily.  She denies any acute swelling in the back of her knee.  She denies any bruising around her knee.  She denies any prior injury to this knee.  There is no numbness and tingling down her leg.  She denies any weakness in the leg.  Past Injuries: None Past Surgeries: None Smoking: Former smoker, quit on June 20, 2009 Family Hx: Noncontributory  ROS: Per HPI; in addition no fever, no rash, no additional weakness, no additional numbness, no additional paresthesias, and no additional falls/injury.   All past medical history, medications, and allergies reviewed myself at today's visit.  Objective: BP 128/73   Ht 5\' 8"  (1.727 m)   Wt 260 lb (117.9 kg)   LMP 04/06/2011   BMI 39.53 kg/m  Gen: Right-Hand Dominant. NAD, well groomed, a/o x3, normal affect.  CV: Well-perfused. Warm.  Resp: Non-labored.  Neuro: Sensation intact throughout. No gross coordination deficits.  Gait: Ambulate with noticeable limp in the right leg, ambulating with crutches.  Right knee exam: No erythema, warmth noted.  Mild swelling in the posterior aspect of the right knee without any ecchymosis.  Tenderness palpation over the posterior medial knee.  Full range of motion extension the knee.  Range of motion limited to around 20 degrees in flexion due to pain.  Strength 5 out of 5 in quadricep strength testing.  Strength 5 out of 5 in biceps femorris strength testing.  4+ out of 5  in semimembranosus and semitendinosus strength testing.  Negative valgus stress test, negative varus stress test.  Unable to assess Lachman and posterior drawer due to pain.  ULTRASOUND: Knee, right Diagnostic limited ultrasound imaging obtained of patient's right knee.  - Quadriceps tendon: No appreciated signs of tearing, edema, or calcification.  Small amount of (compressible) fluid/edema noted within the suprapatellar pouch.  - Popliteal fossa: Evidence of a large Baker's cyst present, measuring greater than 4 cm.  When tracking the Baker's cyst medially, amount of fluid seen within the attachments of the semimembranosus and semi-tendinosis tendons.  This fluid appears contiguous with the Baker's cyst.  Semimembranosus and semi-tendinosis tendons appear to be intact. IMPRESSION: findings consistent with large ruptured Baker's cyst.   Assessment and Plan: Right posterior knee pain, with large ruptured Baker's cyst seen on ultrasound.  We discussed treatment options at today's visit.  I believe that a lot of the pain she is having in her hamstring is secondary to Baker's cyst rupture around the tendons of her semimembranosus and semi-tendinosis.  I will provide her with an ace wrap for compression at this time.  She should start on ibuprofen 600 mg 3 times a day for the next 2 weeks.  Hope this will help bring some fluid down as well.  We did give her precautions to elevate the leg.  She should also use an assistance device for ambulation  until she feels more steady on her leg.  We will see back for follow-up in a week.  If she continues have pain at that time, I would consider getting an MRI of her knee to further evaluate to ensure there is no other structural damage in the knee.   Lewanda Rife, MD [facility] Fellow 09/28/2018 12:29 PM  I was the preceptor for this visit and evaluated the patient with the sports medicine fellow.  Ultrasound evidence of a ruptured Baker's cyst.   Treatment as above.  Follow-up in 1 week but if symptoms worsen I recommended she see her orthopedist, Dr. Ninfa Linden. Shellia Cleverly, DO

## 2018-10-05 ENCOUNTER — Encounter: Payer: Self-pay | Admitting: Sports Medicine

## 2018-10-05 ENCOUNTER — Ambulatory Visit (INDEPENDENT_AMBULATORY_CARE_PROVIDER_SITE_OTHER): Payer: BLUE CROSS/BLUE SHIELD | Admitting: Sports Medicine

## 2018-10-05 VITALS — BP 127/80 | Ht 68.0 in | Wt 260.0 lb

## 2018-10-05 DIAGNOSIS — M660 Rupture of popliteal cyst: Secondary | ICD-10-CM

## 2018-10-05 NOTE — Progress Notes (Signed)
   HPI  CC: Right knee pain  [name] is a 63 year old female presents for follow-up of her right knee pain.  She states that her knee pain is significantly improved with compression therapy and NSAID use.  She states she still has some occasional pain when she is trying to move her leg to get to her car.  She also reports some pain with extreme flexion of the knee.  She states overall, the pain has improved across the board.  She is been taken meloxicam as needed.  She denies any instability of the knee.  She denies any locking or catching in the knee.  She denies any feelings of falling.  She has no new trauma to the area.  See HPI and/or previous note for associated ROS.  Objective: BP 127/80   Ht 5\' 8"  (1.727 m)   Wt 260 lb (117.9 kg)   LMP 04/06/2011   BMI 39.53 kg/m  Gen: NAD, well groomed, a/o x3, normal affect.  CV: Well-perfused. Warm.  Resp: Non-labored.  Neuro: Sensation intact throughout. No gross coordination deficits.  Gait: Nonpathologic posture, unremarkable stride without signs of limp or balance issues.  Right knee exam: No erythema, warmth, swelling noted.  Mild tenderness palpation on the medial posterior knee, around the attachment site of the semimembranosus.  Full range of motion of the knee in extension.  Flexion limited to around 90-100 degrees.  Strength out of 5 throughout testing.  Negative ligamentous testing.  Assessment and plan: Right ruptured Baker's cyst, improving.  We discussed treatment options at today's visit.  She seems to be improving with compression therapy and time.  We did discuss if she starts to develop any mechanical symptoms, or instability the knee, to return to the clinic as this may be sign she has additional injury.  At this the case, I would obtain an MRI to further evaluate the knee.  If she continues to improve with compression therapy and NSAIDs, I will see her back in 4 weeks for reevaluation.  At that time I would obtain an ultrasound  to reevaluate.   Lewanda Rife, MD [facility] Fellow 10/05/2018 11:00 AM  I was the preceptor for this visit and available for immediate consultation.  Patient is significantly improved.  Treatment as above. Shellia Cleverly, DO

## 2018-10-11 ENCOUNTER — Encounter (INDEPENDENT_AMBULATORY_CARE_PROVIDER_SITE_OTHER): Payer: Self-pay | Admitting: Physician Assistant

## 2018-10-11 ENCOUNTER — Ambulatory Visit (INDEPENDENT_AMBULATORY_CARE_PROVIDER_SITE_OTHER): Payer: BLUE CROSS/BLUE SHIELD | Admitting: Physician Assistant

## 2018-10-11 ENCOUNTER — Telehealth (INDEPENDENT_AMBULATORY_CARE_PROVIDER_SITE_OTHER): Payer: Self-pay

## 2018-10-11 ENCOUNTER — Ambulatory Visit (INDEPENDENT_AMBULATORY_CARE_PROVIDER_SITE_OTHER): Payer: BLUE CROSS/BLUE SHIELD

## 2018-10-11 DIAGNOSIS — M25561 Pain in right knee: Secondary | ICD-10-CM | POA: Diagnosis not present

## 2018-10-11 MED ORDER — METHYLPREDNISOLONE ACETATE 40 MG/ML IJ SUSP
40.0000 mg | INTRAMUSCULAR | Status: AC | PRN
  Administered 2018-10-11: 40 mg via INTRA_ARTICULAR

## 2018-10-11 MED ORDER — LIDOCAINE HCL 1 % IJ SOLN
5.0000 mL | INTRAMUSCULAR | Status: AC | PRN
  Administered 2018-10-11: 5 mL

## 2018-10-11 NOTE — Progress Notes (Signed)
Office Visit Note   Patient: Stephanie Andrade           Date of Birth: Nov 26, 1955           MRN: [id number] Visit Date: 10/11/2018              Requested by: Margaree Mackintosh, MD [address] [place], [place] [address] PCP: Margaree Mackintosh, MD   Assessment & Plan: Visit Diagnoses:  1. Right knee pain, unspecified chronicity     Plan: We will try to gain approval for supplemental injection in her right knee she like to avoid surgery if possible.  She has Voltaren gel at home which she can use.  She can also take Aleve 2 tablets twice daily with food.  Ice relative rest.  Questions encouraged and answered.  Follow-Up Instructions: Return for Supplemental injection.   Orders:  Orders Placed This Encounter  Procedures  . Large Joint Inj  . XR KNEE 3 VIEW RIGHT   No orders of the defined types were placed in this encounter.     Procedures: Large Joint Inj on 10/11/2018 10:56 AM Indications: pain Details: 22 G 1.5 in needle, anterolateral approach  Arthrogram: No  Medications: 40 mg methylPREDNISolone acetate 40 MG/ML; 5 mL lidocaine 1 % Aspirate: 2 mL yellow Outcome: tolerated well, no immediate complications Procedure, treatment alternatives, risks and benefits explained, specific risks discussed. Consent was given by the patient. Immediately prior to procedure a time out was called to verify the correct patient, procedure, equipment, support staff and site/side marked as required. Patient was prepped and draped in the usual sterile fashion.       Clinical Data: No additional findings.   Subjective: Chief Complaint  Patient presents with  . Right Knee - Pain    HPI Stephanie Andrade swelling Dr. Magnus Ivan service has known tricompartmental arthritis right knee.  She has had pain in the right knee for now 2 weeks.  She reports that a Baker's cyst ruptured and see Dr. Margaretha Sheffield did an ultrasound and confirmed that she did a ruptured Baker's cyst.  Knee pain improved but  now is beginning to become worse.  She is ambulating with a walker.  The knee feels weak and gives way.  No locking catching painful popping.  She notes swelling.  No known injury. Review of Systems See HPI otherwise negative  Objective: Vital Signs: LMP 04/06/2011   Physical Exam Constitutional:      Appearance: She is not ill-appearing or diaphoretic.  Pulmonary:     Effort: Pulmonary effort is normal.  Neurological:     Mental Status: She is alert and oriented to person, place, and time.     Ortho Exam Right knee full extension full flexion.  Tenderness along medial joint line.  Significant patellofemoral crepitus.  No abnormal warmth erythema.  Slight edema. Specialty Comments:  No specialty comments available.  Imaging: Xr Knee 3 View Right  Result Date: 10/11/2018 AP lateral and sunrise view of the right knee: No acute fracture.  Tricompartmental little arthritis with advancement of the medial compartmental arthritis which is now near bone-on-bone compared to films in September.  Advanced patellofemoral arthritic changes.  Periarticular spurs the lateral compartment.    PMFS History: Patient Active Problem List   Diagnosis Date Noted  . Status post total replacement of left hip 08/24/2017  . Unilateral primary osteoarthritis, left hip 11/30/2016  . Biliary obstruction 09/20/2016  . Cholelithiases 09/20/2016  . Status post total replacement of right hip  03/24/2016  . Osteoarthritis of left hip 11/26/2015  . Impaired glucose tolerance 12/09/2014  . Plantar fascia rupture 06/16/2012  . History of vitamin D deficiency 11/15/2011  . History of sarcoidosis 11/15/2011  . Hot flashes not due to menopause 07/19/2011  . Spinal stenosis, lumbar region, with neurogenic claudication 03/10/2011  . Hypertension 02/11/2011  . Asthma 02/11/2011  . Morbid obesity (HCC) 02/11/2011  . Depression 02/11/2011  . Cervical radiculopathy 12/04/2010  . SHOULDER PAIN, BILATERAL 10/01/2010    . WRIST PAIN, BILATERAL 10/01/2010  . LUMBAGO 10/01/2010  . Osteoarthritis of right hip 07/17/2009  . GREATER TROCHANTERIC BURSITIS 07/17/2009   Past Medical History:  Diagnosis Date  . Anemia   . Arthritis   . Asthma   . Depression   . Family history of adverse reaction to anesthesia    sister, brother - PONV  . Family history of ovarian cancer   . Fibroids   . GERD (gastroesophageal reflux disease)    occ  . History of bronchitis   . History of sarcoidosis   . Hypertension   . Pneumonia    hx  . PONV (postoperative nausea and vomiting) 03/26/2016  . Wears glasses     Family History  Problem Relation Age of Onset  . Cancer Mother   . Arthritis Father   . Hypertension Father   . Arthritis Sister   . Hypertension Brother   . Breast cancer Maternal Grandfather     Past Surgical History:  Procedure Laterality Date  . CHOLECYSTECTOMY N/A 09/22/2016   Procedure: LAPAROSCOPIC CHOLECYSTECTOMY WITH INTRAOPERATIVE CHOLANGIOGRAM;  Surgeon: Harriette Bouillon, MD;  Location: [facility] OR;  Service: General;  Laterality: N/A;  . COLONOSCOPY    . ESOPHAGOGASTRODUODENOSCOPY    . JOINT REPLACEMENT    . 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   Procedure: RIGHT TOTAL HIP ARTHROPLASTY ANTERIOR APPROACH;  Surgeon: Kathryne Hitch, MD;  Location: [facility] OR;  Service: Orthopedics;  Laterality: Right;  . TOTAL HIP ARTHROPLASTY Left 08/24/2017  . TOTAL HIP ARTHROPLASTY Left 08/24/2017   Procedure: LEFT TOTAL HIP ARTHROPLASTY ANTERIOR APPROACH;  Surgeon: Kathryne Hitch, MD;  Location: [facility] OR;  Service: Orthopedics;  Laterality: Left;   Social History   Occupational History  . Not on file  Tobacco Use  . Smoking status: Former Smoker    Types: Cigarettes    Last attempt to quit: 06/20/2009    Years since quitting: 9.3  . Smokeless tobacco: Never Used  . Tobacco comment: "smoked 2 weeks out of the year for about 15 years"   Substance and Sexual Activity  . Alcohol use: Yes    Comment:  socially  . Drug use: No  . Sexual activity: Not on file

## 2018-10-11 NOTE — Telephone Encounter (Signed)
Can you please see if we can get patient approved for right knee gel injection, patient seen by Artis Delay today.

## 2018-10-14 NOTE — Telephone Encounter (Signed)
Noted  

## 2018-10-17 ENCOUNTER — Telehealth (INDEPENDENT_AMBULATORY_CARE_PROVIDER_SITE_OTHER): Payer: Self-pay

## 2018-10-17 NOTE — Telephone Encounter (Signed)
Submitted VOB for SynviscOne, right knee. 

## 2018-10-18 ENCOUNTER — Ambulatory Visit: Payer: Self-pay

## 2018-10-18 ENCOUNTER — Ambulatory Visit (INDEPENDENT_AMBULATORY_CARE_PROVIDER_SITE_OTHER): Payer: BLUE CROSS/BLUE SHIELD | Admitting: Sports Medicine

## 2018-10-18 ENCOUNTER — Encounter: Payer: Self-pay | Admitting: Sports Medicine

## 2018-10-18 VITALS — BP 131/73 | Ht 68.0 in | Wt 260.0 lb

## 2018-10-18 DIAGNOSIS — M7050 Other bursitis of knee, unspecified knee: Secondary | ICD-10-CM | POA: Diagnosis not present

## 2018-10-18 DIAGNOSIS — M7051 Other bursitis of knee, right knee: Secondary | ICD-10-CM | POA: Diagnosis not present

## 2018-10-18 DIAGNOSIS — M1711 Unilateral primary osteoarthritis, right knee: Secondary | ICD-10-CM

## 2018-10-18 DIAGNOSIS — M25561 Pain in right knee: Secondary | ICD-10-CM | POA: Diagnosis not present

## 2018-10-18 MED ORDER — PREDNISONE 10 MG PO TABS: ORAL_TABLET | ORAL | 0 refills | Status: DC

## 2018-10-18 NOTE — Assessment & Plan Note (Signed)
-   prednisone 30mg  bid x 5d. Counseled on common and severe side effects. - did not offer injection as she recently had an intra-articular injection 7d ago - SGT exercises to be completed for the next 4-6 weeks - f/u in 2-3 weeks given she is having trouble with weight bearing, it is also possible she is having a steroid flare which is complicating her pain picture - compression with an ace bandage, ice, and elevation warranted, crutches PRN - f/u ultrasound in 3-4 weeks

## 2018-10-18 NOTE — Progress Notes (Signed)
Stephanie Andrade - 63 y.o. female MRN [id number]  Date of birth: 1956/07/11   Chief complaint: Right knee pain  SUBJECTIVE:    History of present illness: 63 year old female that presents today with a chief complaint of right knee pain and swelling.  She states her symptoms have been present for approximately 1 month and have persisted.  She was last seen here approximately 3 weeks ago complaining of posterior knee pain.  She had x-rays performed by her orthopedic physician Dr. Magnus Ivan that demonstrated severe osteoarthritis of the right knee.  She also had a limited diagnostic ultrasound done by Dr. Durwin Nora which demonstrated a 4 cm Baker's cyst.  The following day after her ultrasound she had a rupture of this Baker's cyst with subsequent lower extremity swelling.  Her posterior symptoms resolved however she continues to have persistent anterior and anterior medial knee symptoms.  At her orthopedic office, an attempt at aspiration was performed without any significant fluid yielded.  Subsequently cortisone was injected to the knee approximately 1 week ago.  She reports no resolution of her symptoms.  Today her pain is 7 out of 10 localized to the anterior of the knee. She describes it as sharp and debilitating. She is ambulating with crutches 2/2 to the severity of the pain.  She has tried 600 mg every 6 hours of ibuprofen, Voltaren gel, and an Ace bandage which has not been helping with her symptoms.  Any type of weightbearing or ambulation exacerbate her symptoms.  Denies any numbness or tingling of the knee.  No low back pain.  No significant weakness.  She was told at the orthopedic office that she needs a total knee arthroplasty however she is not currently ready for this at this time.  She does have a history of bilateral hip total arthroplasties.   Review of systems:  As stated above   Interval past medical history, surgical history, family history, and social history obtained and are  unchanged.   Of note she has a history of sarcoidosis and was on long-term steroids for approximately 3 years.  She also has a history of bilateral total hip arthroplasties.  She is a former smoker.  Medications reviewed and unchanged.  Pertinent medications include: Voltaren gel, ibuprofen, gabapentin, and Ambien Allergies reviewed and unchanged.  OBJECTIVE:  Physical exam: Vital signs are reviewed. BP 131/73   Ht 5\' 8"  (1.727 m)   Wt 260 lb (117.9 kg)   LMP 04/06/2011   BMI 39.53 kg/m   Gen.: Alert, oriented, appears stated age, in no apparent distress Integumentary: No rashes, edema or ecchymoses Neurologic: Sensation intact light touch L4-S1 Gait: Ambulating with crutches with minimal weightbearing of her right knee Musculoskeletal: Difficult to ascertain whether or not she has a knee effusion based on body habitus.  Ecchymoses noted at the site of prior aspiration in the superior lateral aspect of the right knee.  No evidence of infection.  No palpable warmth.  Tenderness palpation over her medial joint line as well as at her Pes anserine bursa.  Full range of motion in knee flexion and extension.  Strength testing is limited secondary to pain.  Negative anterior posterior drawer.  Negative Lockman.  Negative McMurray's.  Negative varus valgus stress.  Neurovascularly intact.  ULTRASOUND: right knee  Limited diagnostic ultrasound obtained of patient's right knee.  -Trivial effusion noted in the suprapatellar pouch with contraction of the quadriceps muscle. -No significant abnormalities on the medial joint line. -No significant abnormalities on the lateral joint  line. -Patellar tendon is intact without evidence of rupture or hypoechoic fluid. -pes anserine bursa is visualized with moderate fluid just deep to the sartorius/semi-tendinosis/gracilis tendons at their insertion.  No evidence of tendon rupture.  IMPRESSION: findings consistent with pes anserine bursitis.  Ultrasound and  interpretation by Gustavus Messing DO and  Stephanie Parr. Fields, MD    ASSESSMENT & PLAN: Pes anserine bursitis - prednisone 30mg  bid x 5d. Counseled on common and severe side effects. - did not offer injection as she recently had an intra-articular injection 7d ago - SGT exercises to be completed for the next 4-6 weeks - f/u in 2-3 weeks given she is having trouble with weight bearing, it is also possible she is having a steroid flare which is complicating her pain picture - compression with an ace bandage, ice, and elevation warranted, crutches PRN - f/u ultrasound in 3-4 weeks   Orders Placed This Encounter  Procedures  . [place] LIMITED JOINT SPACE STRUCTURES LOW RIGHT    Standing Status:   Future    Number of Occurrences:   1    Standing Expiration Date:   12/18/2019    Order Specific Question:   Reason for Exam (SYMPTOM  OR DIAGNOSIS REQUIRED)    Answer:   right knee pain    Order Specific Question:   Preferred imaging location?    Answer:   Internal    Meds ordered this encounter  Medications  . predniSONE (DELTASONE) 10 MG tablet    Sig: Take one tab BID    Dispense:  30 tablet    Refill:  0      Gustavus Messing, DO Sports Medicine Fellow [facility]  I observed and examined the patient with the [facility] and agree with assessment and plan.  Note reviewed and modified by me. Sterling Big, MD

## 2018-10-20 ENCOUNTER — Telehealth (INDEPENDENT_AMBULATORY_CARE_PROVIDER_SITE_OTHER): Payer: Self-pay

## 2018-10-20 ENCOUNTER — Ambulatory Visit: Payer: BLUE CROSS/BLUE SHIELD | Admitting: Sports Medicine

## 2018-10-20 NOTE — Telephone Encounter (Signed)
PA required for SynviscOne, right knee. Faxed completed PA form to [employer] at [phone]. 

## 2018-10-26 ENCOUNTER — Telehealth (INDEPENDENT_AMBULATORY_CARE_PROVIDER_SITE_OTHER): Payer: Self-pay | Admitting: Radiology

## 2018-10-26 NOTE — Telephone Encounter (Signed)
IC patient and advised that [employer] authorized Synvisc One, auth # [id number], valid 10/20/2018 through 10/20/2019, 48 units for right knee. Insurance pays 80% after deductible met.  She wants to review info about this medication and will check calendar and call us if she wants to schedule.

## 2018-11-01 ENCOUNTER — Encounter: Payer: Self-pay | Admitting: Sports Medicine

## 2018-11-01 ENCOUNTER — Ambulatory Visit (INDEPENDENT_AMBULATORY_CARE_PROVIDER_SITE_OTHER): Payer: BLUE CROSS/BLUE SHIELD | Admitting: Sports Medicine

## 2018-11-01 ENCOUNTER — Other Ambulatory Visit: Payer: Self-pay

## 2018-11-01 VITALS — BP 142/72 | Temp 98.3°F | Ht 68.0 in | Wt 260.0 lb

## 2018-11-01 DIAGNOSIS — M1711 Unilateral primary osteoarthritis, right knee: Secondary | ICD-10-CM

## 2018-11-01 DIAGNOSIS — M7050 Other bursitis of knee, unspecified knee: Secondary | ICD-10-CM | POA: Diagnosis not present

## 2018-11-01 MED ORDER — HYDROCODONE-ACETAMINOPHEN 5-325 MG PO TABS: 1.0000 | ORAL_TABLET | Freq: Four times a day (QID) | ORAL | 0 refills | Status: DC | PRN

## 2018-11-01 NOTE — Patient Instructions (Signed)
Dx: Primary OA of the right knee  Recommend water aerobics +/- recumbent bike

## 2018-11-01 NOTE — Assessment & Plan Note (Signed)
-  Reviewed x-rays of the patient's right knee with her in the room.  Demonstrate moderate to severe osteoarthritis of the right knee tricompartmentally and severe tricompartmental arthritis of the left knee. -Discussed prescription medications for arthritis treatment.  She will continue ibuprofen 800 mg 3 times daily for the next 3 to 4 weeks.  I did caution her on long-term use of anti-inflammatories and common and severe side effects of this. -She was prescribed Norco 5 mg today number 20 pills.  Take every 6 hours as needed for breakthrough pain.  Discussed that this is a temporary prescription and not a long-term solution to her problem.  Counseled on common and severe side effects.  The ultimate solution to knee osteoarthritis is a total knee arthroplasty. -Patient did have Synvisc approved by her insurance.  She will call this office if she desires to start the series. -Continue water aerobics exercises plus or minus the addition of lateral recumbent bike minimum 5 days/week -Follow-up in 3 months

## 2018-11-01 NOTE — Progress Notes (Signed)
Stephanie Andrade - 63 y.o. female MRN [id number]  Date of birth: Sep 07, 1955   Chief complaint: R knee pain  SUBJECTIVE:    History of present illness: 63 year old female who presents today for follow-up of ongoing right knee pain.  She has known moderate to severe osteoarthritis of the right knee with a history of bilateral total hip arthroplasties done by Dr. Magnus Ivan.  She was last seen 2 weeks ago and diagnosed with Pes anserine bursitis.  She was placed on oral prednisone and reports significant relief of her medial knee pain.  She states her ability to ambulate has improved significantly after the medicine.  She is also been doing a home exercise program for strengthening of her sartorius, gracilis, and semitendinosus muscles.  The pain involving her Pes bursa is rated a 3 out of 10 today which is significantly improved.  She is however having significant medial and lateral joint line pain.  It is worse with certain movements and prolonged walking.  It improves with anti-inflammatory medications and activity modifications.  She states she used to do pool exercises however has not done these in the past few weeks since her knee has flared on her.  She last received an intra-articular steroid injection by her orthopedic surgeon 4 weeks ago.  This did not provide significant relief for her at the time.  She is here today to discuss alternative options for her knee pain.  It was recommended in the past for total knee arthroplasty however she was not ready for that.  She denies any new injury or new trauma.  No new symptoms of numbness or tingling.  She denies weakness of her knee or hip.  Denies fevers or chills.   Review of systems:  As stated above   Interval past medical history, surgical history, family history, and social history obtained and are unchanged.   Of note she has a history of bilateral total hip arthroplasties.  She is a former smoker.  Medications reviewed and unchanged.  Of note  she takes ibuprofen. Allergies reviewed and unchanged.  Of note she is intolerant to tramadol secondary to nausea.  OBJECTIVE:  Physical exam: Vital signs are reviewed. BP (!) 142/72   Temp 98.3 F (36.8 C)   Ht 5\' 8"  (1.727 m)   Wt 260 lb (117.9 kg)   LMP 04/06/2011   BMI 39.53 kg/m   Gen.: Alert, oriented, appears stated age, in no apparent distress Integumentary: No rashes or ecchymoses Neurologic:  Sensation is intact to light touch L4-S1 Gait: Antalgic with a cane for assisted ambulation in her left hand Psych: Normal affect, mood is described as good Musculoskeletal: Inspection of her right knee demonstrates no acute abnormalities.  She has significant medial and lateral joint line tenderness to palpation.  No significant tenderness over her pes bursa.  No palpable Baker's cyst.  Positive patellofemoral crepitus with range of motion.  Full range of motion knee flexion extension.  Strength testing 5 out of 5 in all ranges of motion of the knee.  Negative anterior and posterior drawer.  Negative Lockman test.  The knee is stable to varus valgus stress.  Negative McMurray's testing.  Neurovascularly intact.    ASSESSMENT & PLAN: Primary osteoarthritis of right knee -Reviewed x-rays of the patient's right knee with her in the room.  Demonstrate moderate to severe osteoarthritis of the right knee tricompartmentally and severe tricompartmental arthritis of the left knee. -Discussed prescription medications for arthritis treatment.  She will continue ibuprofen 800 mg  3 times daily for the next 3 to 4 weeks.  I did caution her on long-term use of anti-inflammatories and common and severe side effects of this. -She was prescribed Norco 5 mg today number 20 pills.  Take every 6 hours as needed for breakthrough pain.  Discussed that this is a temporary prescription and not a long-term solution to her problem.  Counseled on common and severe side effects.  The ultimate solution to knee  osteoarthritis is a total knee arthroplasty. -Patient did have Synvisc approved by her insurance.  She will call this office if she desires to start the series. -Continue water aerobics exercises plus or minus the addition of lateral recumbent bike minimum 5 days/week -Follow-up in 3 months   Meds ordered this encounter  Medications  . HYDROcodone-acetaminophen (NORCO) 5-325 MG tablet    Sig: Take 1 tablet by mouth every 6 (six) hours as needed for moderate pain.    Dispense:  20 tablet    Refill:  0      Gustavus Messing, DO [facility] [place]  I observed and examined the patient with the Dr. Laureen Ochs and agree with assessment and plan.  Note reviewed and modified by me. Sterling Big, MD

## 2018-11-02 ENCOUNTER — Ambulatory Visit: Payer: BLUE CROSS/BLUE SHIELD | Admitting: Sports Medicine

## 2018-12-02 ENCOUNTER — Other Ambulatory Visit: Payer: BLUE CROSS/BLUE SHIELD | Admitting: Internal Medicine

## 2018-12-02 ENCOUNTER — Ambulatory Visit (INDEPENDENT_AMBULATORY_CARE_PROVIDER_SITE_OTHER): Payer: BLUE CROSS/BLUE SHIELD | Admitting: Family Medicine

## 2018-12-02 ENCOUNTER — Encounter: Payer: Self-pay | Admitting: Family Medicine

## 2018-12-02 ENCOUNTER — Other Ambulatory Visit: Payer: Self-pay

## 2018-12-02 VITALS — BP 118/78 | HR 81 | Temp 98.4°F | Ht 68.0 in | Wt 264.0 lb

## 2018-12-02 DIAGNOSIS — H9202 Otalgia, left ear: Secondary | ICD-10-CM | POA: Diagnosis not present

## 2018-12-02 DIAGNOSIS — I10 Essential (primary) hypertension: Secondary | ICD-10-CM | POA: Diagnosis not present

## 2018-12-02 DIAGNOSIS — R7302 Impaired glucose tolerance (oral): Secondary | ICD-10-CM | POA: Diagnosis not present

## 2018-12-02 DIAGNOSIS — F320 Major depressive disorder, single episode, mild: Secondary | ICD-10-CM | POA: Diagnosis not present

## 2018-12-02 DIAGNOSIS — H6121 Impacted cerumen, right ear: Secondary | ICD-10-CM

## 2018-12-02 MED ORDER — CIPROFLOXACIN-DEXAMETHASONE 0.3-0.1 % OT SUSP: 4.0000 [drp] | Freq: Two times a day (BID) | OTIC | 0 refills | Status: DC

## 2018-12-02 NOTE — Progress Notes (Signed)
Please see if pt is changing providers

## 2018-12-02 NOTE — Progress Notes (Signed)
LVM to Call office  °

## 2018-12-02 NOTE — Patient Instructions (Signed)
So nice to meet you! email me when you're ready for refills!   Come back in may for annual/labs.

## 2018-12-02 NOTE — Progress Notes (Signed)
Patient: Stephanie Andrade MRN: [id number] DOB: 21-Oct-1955 PCP: Orland Mustard, MD     Subjective:  Chief Complaint  Patient presents with  . Establish Care  . swelling in b/l ears?    HPI:  Hypertension: Here for follow up of hypertension.  Currently on cozaar 50mg . Takes medication as prescribed and denies any side effects. Exercise includes water aerobics 2-3x/week. Weight has been stable. Denies any chest pain, headaches, shortness of breath, vision changes, swelling in lower extremities.   Depression: currently on wellbutrin XL. She has been on this for years and is happy with current dosing. Was on 300mg  for a long time and decreased down to 150mg  about 4 years ago when life became more simple. It keeps her even and she is happy with dose. She does have hx of depression in her family. No hx of suicide attempt.   Impaired glucose tolerance: she had one elevated a1c of 5.8 about one year ago. All others have been to goal. She has never been on diabetic medication. No weight loss. Her lowest weight has been 230 with weight watchers. She does watch what she eats with sugar.   OA: hx of bilateral hip replacements and getting therapy for her right knee. 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   Bilateral ear issues: she states for the past 10 days she has had ear issues. When it first started they felt like the were burning on the inside with no heat. Now no burning, but tingling sensation. Worse on the right side than the left side. No fever/chills. No hearing loss or drainage, but it feels swollen. She has taken mucinex x 4 days with no relief. She has no ringing in her ears. Dizziness x 1 day only.   Immunization History  Administered Date(s) Administered  . Pneumococcal Conjugate-13 12/28/2017  . Pneumococcal Polysaccharide-23 04/25/2012  . Tdap 09/20/2009     Review of Systems  Constitutional: Negative for fatigue.  HENT: Negative.    Eyes: Negative for visual disturbance.  Respiratory: Negative for cough and shortness of breath.   Cardiovascular: Negative for chest pain.  Gastrointestinal: Negative for abdominal pain and nausea.  Endocrine: Negative for polydipsia.  Genitourinary: Negative.   Musculoskeletal: Negative for back pain and neck pain.  Skin: Negative.   Neurological: Positive for dizziness. Negative for headaches.       C/o intermittent dizziness w/recent ear discomfort.  Not dizzy today  Hematological: Negative.   Psychiatric/Behavioral: Positive for sleep disturbance. Negative for dysphoric mood. The patient is not nervous/anxious.     Allergies Patient is allergic to mushroom extract complex; codeine; dextromethorphan; levaquin [levofloxacin in d5w]; macrobid  [nitrofurantoin macrocrystal]; tape; tequila sunrise flavor; and tessalon perles.  Past Medical History Patient  has a past medical history of Anemia, Arthritis, Asthma, Depression, Family history of adverse reaction to anesthesia, Family history of ovarian cancer, Fibroids, GERD (gastroesophageal reflux disease), History of bronchitis, History of sarcoidosis, Hypertension, Pneumonia, PONV (postoperative nausea and vomiting) (03/26/2016), and Wears glasses.  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  Family History Pateint's family history includes Arthritis in her father and sister; Breast cancer in her maternal grandfather; Cancer in her mother; Hypertension in her brother and father.  Social History Patient  reports that she quit smoking about 9 years ago. Her smoking use  included cigarettes. She has never used smokeless tobacco. She reports current alcohol use. She reports that she does not use  drugs.    Objective: Vitals:   12/02/18 1024  BP: 118/78  Pulse: 81  Temp: 98.4 F (36.9 C)  TempSrc: Oral  SpO2: 98%  Weight: 264 lb (119.7 kg)  Height: 5\' 8"  (1.727 m)    Body mass index is 40.14 kg/m.  Physical Exam Vitals signs reviewed.  Constitutional:      Appearance: Normal appearance. She is obese.  HENT:     Head: Normocephalic and atraumatic.     Right Ear: Tympanic membrane, ear canal and external ear normal. There is impacted cerumen.     Left Ear: Tympanic membrane, ear canal and external ear normal.     Nose: Nose normal.  Eyes:     Extraocular Movements: Extraocular movements intact.     Pupils: Pupils are equal, round, and reactive to light.  Neck:     Musculoskeletal: Normal range of motion and neck supple.  Cardiovascular:     Rate and Rhythm: Normal rate and regular rhythm.     Pulses: Normal pulses.     Heart sounds: Normal heart sounds.  Pulmonary:     Effort: Pulmonary effort is normal.     Breath sounds: Normal breath sounds.  Abdominal:     General: Abdomen is flat. Bowel sounds are normal.     Palpations: Abdomen is soft.  Musculoskeletal:        General: Swelling (right ankle, right knee ) present.  Skin:    General: Skin is warm.     Capillary Refill: Capillary refill takes less than 2 seconds.  Neurological:     General: No focal deficit present.     Mental Status: She is alert and oriented to person, place, and time.  Psychiatric:        Mood and Affect: Mood normal.        Behavior: Behavior normal.    Ceruminosis is noted.  Wax is removed by syringing and manual debridement. Instructions for home care to prevent wax buildup are given. Performed by [facility]      Depression screen [facility] 2/9 12/02/2018 06/28/2018 04/12/2017 07/01/2015 05/22/2015  Decreased Interest 1 0 0 0 0  Down, Depressed, Hopeless 0 0 0 0 0  PHQ - 2 Score 1 0 0 0 0  Altered sleeping 1 - - - -  Tired, decreased energy 2 - - - -  Change in appetite 3 - - - -   Feeling bad or failure about yourself  0 - - - -  Trouble concentrating 0 - - - -  Moving slowly or fidgety/restless 0 - - - -  Suicidal thoughts 0 - - - -  PHQ-9 Score 7 - - - -  Difficult doing work/chores Not difficult at all - - - -     Assessment/plan: 1. Essential hypertension Blood pressure is to goal. Continue current anti-hypertensive medications. Routine lab work will be done at annual. Recommended routine exercise and healthy diet including DASH diet and mediterranean diet. Encouraged weight loss. F/u in 12 months.   2. Current mild episode of major depressive disorder, unspecified whether recurrent (HCC) phq9 score mild and well controlled on her wellbutrin. Refill needed in a few weeks. Will email me when she is ready for this. Continue current medication. Exercise encouraged.   3. Impaired glucose tolerance Will come back for labs in may. Check a1c.   4. Acute left  otalgia  No abnormality seen on exam. Will do trial of ciprodex. I think she will tolerate this fine as her allergy to oral flouroquinolones is myalgia. If any issues she is to stop. If does not resolved she is to let me know.     Return in about 1 month (around 01/01/2019) for annual/labs.    Orland Mustard, MD [facility]  12/02/2018

## 2018-12-05 ENCOUNTER — Encounter: Payer: Self-pay | Admitting: Family Medicine

## 2018-12-06 ENCOUNTER — Encounter: Payer: BLUE CROSS/BLUE SHIELD | Admitting: Internal Medicine

## 2018-12-07 ENCOUNTER — Other Ambulatory Visit: Payer: Self-pay | Admitting: Family Medicine

## 2018-12-07 MED ORDER — NEOMYCIN-POLYMYXIN-HC 3.5-10000-1 OT SOLN: 3.0000 [drp] | Freq: Four times a day (QID) | OTIC | 0 refills | Status: DC

## 2018-12-26 ENCOUNTER — Encounter: Payer: Self-pay | Admitting: Family Medicine

## 2018-12-27 MED ORDER — BUPROPION HCL ER (XL) 150 MG PO TB24: ORAL_TABLET | ORAL | 0 refills | Status: DC

## 2018-12-27 MED ORDER — LOSARTAN POTASSIUM 50 MG PO TABS: 50.0000 mg | ORAL_TABLET | Freq: Every day | ORAL | 0 refills | Status: DC

## 2018-12-30 ENCOUNTER — Other Ambulatory Visit: Payer: BLUE CROSS/BLUE SHIELD | Admitting: Internal Medicine

## 2019-01-03 ENCOUNTER — Other Ambulatory Visit: Payer: BLUE CROSS/BLUE SHIELD | Admitting: Internal Medicine

## 2019-01-05 ENCOUNTER — Encounter: Payer: BLUE CROSS/BLUE SHIELD | Admitting: Internal Medicine

## 2019-01-06 ENCOUNTER — Encounter: Payer: Self-pay | Admitting: Family Medicine

## 2019-01-06 ENCOUNTER — Ambulatory Visit (INDEPENDENT_AMBULATORY_CARE_PROVIDER_SITE_OTHER): Payer: BLUE CROSS/BLUE SHIELD | Admitting: Family Medicine

## 2019-01-06 ENCOUNTER — Other Ambulatory Visit: Payer: Self-pay

## 2019-01-06 VITALS — BP 120/74 | HR 70 | Temp 98.3°F | Ht 68.0 in | Wt 268.8 lb

## 2019-01-06 DIAGNOSIS — I10 Essential (primary) hypertension: Secondary | ICD-10-CM | POA: Diagnosis not present

## 2019-01-06 DIAGNOSIS — Z0000 Encounter for general adult medical examination without abnormal findings: Secondary | ICD-10-CM | POA: Diagnosis not present

## 2019-01-06 DIAGNOSIS — R7302 Impaired glucose tolerance (oral): Secondary | ICD-10-CM | POA: Diagnosis not present

## 2019-01-06 DIAGNOSIS — Z1159 Encounter for screening for other viral diseases: Secondary | ICD-10-CM | POA: Diagnosis not present

## 2019-01-06 DIAGNOSIS — Z114 Encounter for screening for human immunodeficiency virus [HIV]: Secondary | ICD-10-CM

## 2019-01-06 DIAGNOSIS — M1711 Unilateral primary osteoarthritis, right knee: Secondary | ICD-10-CM

## 2019-01-06 LAB — LIPID PANEL
Cholesterol: 148 mg/dL (ref 0–200)
HDL: 61.9 mg/dL (ref >39.00)
LDL Cholesterol: 66 mg/dL (ref 0–99)
NonHDL: 86.43
Total CHOL/HDL Ratio: 2
Triglycerides: 104 mg/dL (ref 0.0–149.0)
VLDL: 20.8 mg/dL (ref 0.0–40.0)

## 2019-01-06 LAB — CBC WITH DIFFERENTIAL/PLATELET
Basophils Absolute: 0 10*3/uL (ref 0.0–0.1)
Basophils Relative: 0.9 % (ref 0.0–3.0)
Eosinophils Absolute: 0.2 10*3/uL (ref 0.0–0.7)
Eosinophils Relative: 2.8 % (ref 0.0–5.0)
HCT: 36 % (ref 36.0–46.0)
Hemoglobin: 12 g/dL (ref 12.0–15.0)
Lymphocytes Relative: 27.4 % (ref 12.0–46.0)
Lymphs Abs: 1.6 10*3/uL (ref 0.7–4.0)
MCHC: 33.4 g/dL (ref 30.0–36.0)
MCV: 81.6 fl (ref 78.0–100.0)
Monocytes Absolute: 0.3 10*3/uL (ref 0.1–1.0)
Monocytes Relative: 4.7 % (ref 3.0–12.0)
Neutro Abs: 3.6 10*3/uL (ref 1.4–7.7)
Neutrophils Relative %: 64.2 % (ref 43.0–77.0)
Platelets: 213 10*3/uL (ref 150.0–400.0)
RBC: 4.41 Mil/uL (ref 3.87–5.11)
RDW: 13.8 % (ref 11.5–15.5)
WBC: 5.7 10*3/uL (ref 4.0–10.5)

## 2019-01-06 LAB — MICROALBUMIN / CREATININE URINE RATIO
Creatinine,U: 67.8 mg/dL
Microalb Creat Ratio: 1 mg/g (ref 0.0–30.0)
Microalb, Ur: <0.7 mg/dL (ref 0.0–1.9)

## 2019-01-06 LAB — COMPREHENSIVE METABOLIC PANEL
ALT: 15 U/L (ref 0–35)
AST: 16 U/L (ref 0–37)
Albumin: 4.3 g/dL (ref 3.5–5.2)
Alkaline Phosphatase: 67 U/L (ref 39–117)
BUN: 17 mg/dL (ref 6–23)
CO2: 30 mEq/L (ref 19–32)
Calcium: 9.4 mg/dL (ref 8.4–10.5)
Chloride: 104 mEq/L (ref 96–112)
Creatinine, Ser: 0.67 mg/dL (ref 0.40–1.20)
GFR: 89.22 mL/min (ref >60.00)
Glucose, Bld: 85 mg/dL (ref 70–99)
Potassium: 4 mEq/L (ref 3.5–5.1)
Sodium: 141 mEq/L (ref 135–145)
Total Bilirubin: 0.5 mg/dL (ref 0.2–1.2)
Total Protein: 6.9 g/dL (ref 6.0–8.3)

## 2019-01-06 LAB — HEMOGLOBIN A1C: Hgb A1c MFr Bld: 5.4 % (ref 4.6–6.5)

## 2019-01-06 LAB — TSH: TSH: 0.93 u[IU]/mL (ref 0.35–4.50)

## 2019-01-06 MED ORDER — TRAMADOL HCL 50 MG PO TABS: 50.0000 mg | ORAL_TABLET | Freq: Two times a day (BID) | ORAL | 0 refills | Status: AC | PRN

## 2019-01-06 NOTE — Progress Notes (Signed)
Patient: Stephanie Andrade MRN: [id number] DOB: [birth year] PCP: Orland Mustard, MD     Subjective:  Chief Complaint  Patient presents with  . Annual Exam    HPI: The patient is a 63 y.o. female who presents today for annual exam. She denies any changes to past medical history. There have been no recent hospitalizations. They are following a well balanced diet and exercise plan. Weight has been steday. No complaints today.   Immunization History  Administered Date(s) Administered  . Pneumococcal Conjugate-13 12/28/2017  . Pneumococcal Polysaccharide-23 04/25/2012  . Tdap 09/20/2009   Colonoscopy: 2009-due for this  Mammogram: 12/15/2017 Pap smear: 05/17/2016   Review of Systems  Constitutional: Negative for chills, fatigue, fever and unexpected weight change.  HENT: Negative for dental problem, ear pain, hearing loss and trouble swallowing.   Eyes: Negative for visual disturbance.  Respiratory: Negative for cough, chest tightness and shortness of breath.   Cardiovascular: Negative for chest pain, palpitations and leg swelling.  Gastrointestinal: Negative for abdominal pain, blood in stool, diarrhea and nausea.  Endocrine: Negative for cold intolerance, polydipsia, polyphagia and polyuria.  Genitourinary: Negative for dysuria and hematuria.  Musculoskeletal: Positive for arthralgias. Negative for back pain and neck pain.  Skin: Negative.  Negative for rash.  Neurological: Negative for dizziness and headaches.  Psychiatric/Behavioral: Negative for dysphoric mood and sleep disturbance. The patient is not nervous/anxious.     Allergies Patient is allergic to mushroom extract complex; codeine; dextromethorphan; levaquin [levofloxacin in d5w]; macrobid  [nitrofurantoin macrocrystal]; tape; tequila sunrise flavor; and tessalon perles.  Past Medical History Patient  has a past medical history of Anemia, Arthritis, Asthma, Depression, Family history of adverse reaction to anesthesia,  Family history of ovarian cancer, Fibroids, GERD (gastroesophageal reflux disease), History of bronchitis, History of sarcoidosis, Hypertension, Pneumonia, PONV (postoperative nausea and vomiting) (03/26/2016), and Wears glasses.  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  Family History Pateint's family history includes Arthritis in her father and sister; Breast cancer in her maternal grandfather; Cancer in her mother; Hypertension in her brother and father.  Social History Patient  reports that she quit smoking about 9 years ago. Her smoking use included cigarettes. She has never used smokeless tobacco. She reports current alcohol use. She reports that she does not use drugs.    Objective: Vitals:   01/06/19 0839  BP: 120/74  Pulse: 70  Temp: 98.3 F (36.8 C)  TempSrc: Oral  SpO2: 98%  Weight: 268 lb 12.8 oz (121.9 kg)  Height: 5\' 8"  (1.727 m)    Body mass index is 40.87 kg/m.  Physical Exam Vitals signs reviewed.  Constitutional:      Appearance: She is well-developed. She is obese.  HENT:     Head: Normocephalic and atraumatic.     Right Ear: Tympanic membrane, ear canal and external ear normal.     Left Ear: Tympanic membrane, ear canal and external ear normal.     Nose: Nose normal.     Mouth/Throat:     Mouth: Mucous membranes are moist.  Eyes:     Extraocular Movements: Extraocular movements intact.     Conjunctiva/sclera: Conjunctivae normal.     Pupils: Pupils are equal, round, and reactive to light.  Neck:     Musculoskeletal: Normal range of motion and neck supple.     Thyroid: No thyromegaly.  Cardiovascular:     Rate and Rhythm: Normal rate and regular rhythm.     Heart sounds: Normal heart sounds. No murmur.   Pulmonary:     Effort: Pulmonary effort is normal.     Breath sounds: Normal breath sounds.  Abdominal:     General: Bowel sounds are normal. There is no distension.     Palpations: Abdomen is soft.     Tenderness: There is no abdominal tenderness.  Lymphadenopathy:     Cervical: No cervical adenopathy.  Skin:    General: Skin is warm and dry.     Capillary Refill: Capillary refill takes less than 2 seconds.     Findings: No rash.  Neurological:     General: No focal deficit present.     Mental Status: She is alert and oriented to person, place, and time.     Cranial Nerves: No cranial nerve deficit.     Coordination: Coordination normal.     Deep Tendon Reflexes: Reflexes normal.  Psychiatric:        Mood and Affect: Mood normal.        Behavior: Behavior normal.        Assessment/plan: 1. Annual physical exam Routine lab work today. utd on immunizations. Pap smear this year with her gyn. Due for colon cancer screening. Interested in cologuard and information given on this. She is a good candidate and discussed screening with her. Will let us know if she wants [place] to order. Exercise/diet and weight loss. May be interested in consult with Dr. Earlene Plater and will let us know. F/u in one year or as needed.  Patient counseling [x]    Nutrition: Stressed importance of moderation in sodium/caffeine intake, saturated fat and cholesterol, caloric balance, sufficient intake of fresh fruits, vegetables, fiber, calcium, iron, and 1 mg of folate supplement per day (for females capable of pregnancy).  [x]    Stressed the importance of regular exercise.   [x]    Substance Abuse: Discussed cessation/primary prevention of tobacco, alcohol, or other drug use; driving or other dangerous activities under the influence; availability of treatment for abuse.   [x]    Injury prevention: Discussed safety belts, safety helmets, smoke detector, smoking near bedding or upholstery.   [x]    Sexuality: Discussed  sexually transmitted diseases, partner selection, use of condoms, avoidance of unintended pregnancy  and contraceptive alternatives.  [x]    Dental health: Discussed importance of regular tooth brushing, flossing, and dental visits.  [x]    Health maintenance and immunizations reviewed. Please refer to Health maintenance section.     2. Essential hypertension To goal. Already addressed, just needs labs today.  - CBC with Differential/Platelet - Comprehensive metabolic panel - Microalbumin / creatinine urine ratio - Lipid panel  3. Impaired glucose tolerance Labs today.  - Hemoglobin A1c - TSH  4. Encounter for hepatitis C screening test for low risk patient  - Hepatitis C antibody  5. Encounter for screening for HIV  - HIV Antibody (routine testing w rflx)  7. Bilateral OA of knees -waiting on viscous injections. Started her on tramadol for pain control as her OA is severe and pain quite intense. Takes with mobic. Tolerates tramadol okay. Usually takes one/day, but sometimes will need another in the evening. Wrote for 45/month today. If continues  Long term, will need drug contract, but her injections have been pushed out due to covid.   Return in about 1 year (around 01/06/2020).     Orland Mustard, MD [facility]  01/06/2019

## 2019-01-10 ENCOUNTER — Encounter: Payer: Self-pay | Admitting: Family Medicine

## 2019-01-10 LAB — HEPATITIS C ANTIBODY
Hepatitis C Ab: NONREACTIVE
SIGNAL TO CUT-OFF: 0.12 (ref <1.00)

## 2019-01-10 LAB — HIV ANTIBODY (ROUTINE TESTING W REFLEX): HIV 1&2 Ab, 4th Generation: NONREACTIVE

## 2019-01-11 ENCOUNTER — Encounter: Payer: Self-pay | Admitting: Family Medicine

## 2019-01-29 ENCOUNTER — Other Ambulatory Visit: Payer: Self-pay | Admitting: Family Medicine

## 2019-01-30 MED ORDER — LOSARTAN POTASSIUM 50 MG PO TABS: 50.0000 mg | ORAL_TABLET | Freq: Every day | ORAL | 0 refills | Status: DC

## 2019-01-30 MED ORDER — BUPROPION HCL ER (XL) 150 MG PO TB24: ORAL_TABLET | ORAL | 0 refills | Status: DC

## 2019-02-01 ENCOUNTER — Ambulatory Visit: Payer: BLUE CROSS/BLUE SHIELD | Admitting: Sports Medicine

## 2019-02-02 ENCOUNTER — Ambulatory Visit: Payer: BLUE CROSS/BLUE SHIELD | Admitting: Sports Medicine

## 2019-02-07 ENCOUNTER — Ambulatory Visit: Payer: BLUE CROSS/BLUE SHIELD | Admitting: Sports Medicine

## 2019-02-23 ENCOUNTER — Telehealth: Payer: Self-pay | Admitting: *Deleted

## 2019-02-23 ENCOUNTER — Ambulatory Visit: Payer: Self-pay | Admitting: *Deleted

## 2019-02-23 NOTE — Telephone Encounter (Signed)
Stephanie Andrade scheduled the patient for a virtual visit for 7/10 with Len Blalock, PA-C

## 2019-02-23 NOTE — Telephone Encounter (Signed)
Called patient she does not wish to go to ed due to anxiety and issues. I have reviewed all red words with patient and has promised that she will call 911 if she has any. She would like to keep app with [facility] tomorrow but is aware that she may request that she go to ED at that time as well. She will call office if any issues. She does have someone staying with her that I have reviewed all red words with as well.

## 2019-02-23 NOTE — Telephone Encounter (Signed)
Please call patient and Instruct her to go to the ER, as she will need a cardiac work up and we cannot perform that in office due to possible COVID diagnosis.

## 2019-02-23 NOTE — Telephone Encounter (Signed)
Patient is experiencing- tightness in chest and diarrhea. Pressure and burning sensation in chest- comes and goes- not constant- more constant this morning. Not causing breathing problems.Patient discomfort is midline and low- she has ben having a lot of burping today.  Patient lost her husband on 7/2 and she is not wanting to return to hospital at this time. Call to office to let them know that patient is going to need appointments for her grief, hospital follow up, and to see if they can encourage her to go to ED for evaluation of her symptoms in chest.  Reason for Disposition . MILD-MODERATE diarrhea (e.g., 1-6 times / day more than normal) . [1] Chest pain lasts > 5 minutes AND [2] age > 87  Answer Assessment - Initial Assessment Questions 1. DIARRHEA SEVERITY: "How bad is the diarrhea?" "How many extra stools have you had in the past 24 hours than normal?"    - NO DIARRHEA (SCALE 0)   - MILD (SCALE 1-3): Few loose or mushy BMs; increase of 1-3 stools over normal daily number of stools; mild increase in ostomy output.   -  MODERATE (SCALE 4-7): Increase of 4-6 stools daily over normal; moderate increase in ostomy output. * SEVERE (SCALE 8-10; OR 'WORST POSSIBLE'): Increase of 7 or more stools daily over normal; moderate increase in ostomy output; incontinence.     Loose stools yesterday, mild 2. ONSET: "When did the diarrhea begin?"      yesterday/today 3. BM CONSISTENCY: "How loose or watery is the diarrhea?"      watery 4. VOMITING: "Are you also vomiting?" If so, ask: "How many times in the past 24 hours?"      no 5. ABDOMINAL PAIN: "Are you having any abdominal pain?" If yes: "What does it feel like?" (e.g., crampy, dull, intermittent, constant)      no 6. ABDOMINAL PAIN SEVERITY: If present, ask: "How bad is the pain?"  (e.g., Scale 1-10; mild, moderate, or severe)   - MILD (1-3): doesn't interfere with normal activities, abdomen soft and not tender to touch    - MODERATE (4-7):  interferes with normal activities or awakens from sleep, tender to touch    - SEVERE (8-10): excruciating pain, doubled over, unable to do any normal activities       n/a 7. ORAL INTAKE: If vomiting, "Have you been able to drink liquids?" "How much fluids have you had in the past 24 hours?"     40-60 oz/day 8. HYDRATION: "Any signs of dehydration?" (e.g., dry mouth [not just dry lips], too weak to stand, dizziness, new weight loss) "When did you last urinate?"     none 9. EXPOSURE: "Have you traveled to a foreign country recently?" "Have you been exposed to anyone with diarrhea?" "Could you have eaten any food that was spoiled?"     Husband had been ill- 02/16/19, not diagnosed-but believed possible COVID related 10. ANTIBIOTIC USE: "Are you taking antibiotics now or have you taken antibiotics in the past 2 months?"       no 11. OTHER SYMPTOMS: "Do you have any other symptoms?" (e.g., fever, blood in stool)       Tightness in chest- burning type indigestion yesterday- went away 12. PREGNANCY: "Is there any chance you are pregnant?" "When was your last menstrual period?"       n/a  Answer Assessment - Initial Assessment Questions 1. LOCATION: "Where does it hurt?"       Not pain- burning and tightness- midline- at  the bottom of sternum  2. RADIATION: "Does the pain go anywhere else?" (e.g., into neck, jaw, arms, back)     no 3. ONSET: "When did the chest pain begin?" (Minutes, hours or days)      Yesterday morning 4. PATTERN "Does the pain come and go, or has it been constant since it started?"  "Does it get worse with exertion?"      Constant this morning- no 5. DURATION: "How long does it last" (e.g., seconds, minutes, hours)     Constant since 8 6. SEVERITY: "How bad is the pain?"  (e.g., Scale 1-10; mild, moderate, or severe)    - MILD (1-3): doesn't interfere with normal activities     - MODERATE (4-7): interferes with normal activities or awakens from sleep    - SEVERE (8-10):  excruciating pain, unable to do any normal activities       mild 7. CARDIAC RISK FACTORS: "Do you have any history of heart problems or risk factors for heart disease?" (e.g., prior heart attack, angina; high blood pressure, diabetes, being overweight, high cholesterol, smoking, or strong family history of heart disease)     High blood pressure 8. PULMONARY RISK FACTORS: "Do you have any history of lung disease?"  (e.g., blood clots in lung, asthma, emphysema, birth control pills)     Sarcoid 1993 9. CAUSE: "What do you think is causing the chest pain?"     Reflux, grief, COVID symptom 10. OTHER SYMPTOMS: "Do you have any other symptoms?" (e.g., dizziness, nausea, vomiting, sweating, fever, difficulty breathing, cough)       diarrhea 11. PREGNANCY: "Is there any chance you are pregnant?" "When was your last menstrual period?"       n/a  Protocols used: DIARRHEA-A-AH, CHEST PAIN-A-AH

## 2019-02-23 NOTE — Telephone Encounter (Signed)
Called pt to f/u, she reports that the anti-acid medication helped with the chest tightness feeling. She sounded like she was feeling better. She was made aware office closes at 5 pm and to call 911 if symptoms get worse. She has kept the appointment with Sam for tomorrow.

## 2019-02-23 NOTE — Telephone Encounter (Signed)
Pt had initially been triaged by [facility] nurse, she was transferred to me for further triage since she refused to go to ER. I spoke with her she reports symptoms of chest tightness started yesterday morning and lasted a few hours and by evening/night had went away, but they had returned this morning. She denies any stabbing or radiating chest pain, SOB, dizziness, or headache. She also reports a few episodes of loose stools and some diarrhea, and an increase in her acid reflux. She has checked her oxygen level and pulse at home, oxygen was 99% and pulse 72. She reports she recently lost her husband due to possible COVID, his body is undergoing an autopsy for confirmation, and she has quarantined herself. She reports that today that prior to calling office she took an anti-acid with minimal relief at the time but medication has not been taking long before calling. She did become emotional during the call.  After speaking with pt she has agreed to virtual visit with PA, I will call her back later today to f/u, and she is aware to call office back if new or worsening in symptoms.

## 2019-02-24 ENCOUNTER — Ambulatory Visit (INDEPENDENT_AMBULATORY_CARE_PROVIDER_SITE_OTHER): Payer: BC Managed Care – PPO | Admitting: Physician Assistant

## 2019-02-24 ENCOUNTER — Encounter: Payer: Self-pay | Admitting: Physician Assistant

## 2019-02-24 VITALS — HR 72 | Temp 98.2°F | Wt 250.0 lb

## 2019-02-24 DIAGNOSIS — F418 Other specified anxiety disorders: Secondary | ICD-10-CM | POA: Diagnosis not present

## 2019-02-24 DIAGNOSIS — R0789 Other chest pain: Secondary | ICD-10-CM | POA: Diagnosis not present

## 2019-02-24 NOTE — Progress Notes (Signed)
Virtual Visit via Video   I connected with Stephanie Andrade on 02/24/19 at 10:00 AM EDT by a video enabled telemedicine application and verified that I am speaking with the correct person using two identifiers. Location patient: Home Location provider:  [facility], Office Persons participating in the virtual visit: Stephanie Andrade, Manfredi PA-C   I discussed the limitations of evaluation and management by telemedicine and the availability of in person appointments. The patient expressed understanding and agreed to proceed.  Subjective:   HPI:   Anxiety/Chest tightness/GERD Patient reports symptoms of chest tightness started Wednesday morning and lasted a few hours and by evening/night had went away after getting up and walking around, but they had returned yesterday morning but stayed a little bit longer. She also reports a few episodes of loose stools and some diarrhea, and an increase in her acid reflux. She has checked her oxygen level and pulse at home, oxygen was 99% and pulse 72. She reports she recently lost her husband due to possible COVID, his body is undergoing an autopsy for confirmation, and she has therefore quarantined herself. He also had pulmonary embolism. He passed away 03-18-2023, exactly a week ago. Family has come in to town to help with her. Married for 30 years.   She reports that yesterday, prior to calling office she took an anti-acid with minimal relief at the time. She took Maalox this morning. Sees an acupuncturist regularly for her "digestion" and takes a [place] tea each morning.  She does have a history of depression. Currently taking Wellbutrin 150 mg, has been on Wellbutrin 300 mg but doesn't like the way it makes her feel.   She denies any stabbing or radiating chest pain, fever, SOB, dizziness, or headache. Denies any radiation of pain.   Has history of heartburn, especially prior to when she had gallbladder out. Feels like this is reminiscent  of that.  No suicidal thoughts.   ROS: See pertinent positives and negatives per HPI.  Patient Active Problem List   Diagnosis Date Noted  . Primary osteoarthritis of right knee 11/01/2018  . Pes anserine bursitis 10/18/2018  . Status post total replacement of left hip 08/24/2017  . Status post total replacement of right hip 03/24/2016  . Impaired glucose tolerance 12/09/2014  . History of vitamin D deficiency 11/15/2011  . History of sarcoidosis 11/15/2011  . Hot flashes not due to menopause 07/19/2011  . Spinal stenosis, lumbar region, with neurogenic claudication 03/10/2011  . Hypertension 02/11/2011  . Morbid obesity ([place]) 02/11/2011  . Depression 02/11/2011  . Cervical radiculopathy 12/04/2010    Social History   Tobacco Use  . Smoking status: Former Smoker    Types: Cigarettes    Quit date: 06/20/2009    Years since quitting: 9.6  . Smokeless tobacco: Never Used  . Tobacco comment: "smoked 2 weeks out of the year for about 15 years"   Substance Use Topics  . Alcohol use: Yes    Comment: socially    Current Outpatient Medications:  .  azelastine (ASTELIN) 0.1 % nasal spray, Place into both nostrils 2 (two) times daily. Use in each nostril as directed, Disp: , Rfl:  .  buPROPion (WELLBUTRIN XL) 150 MG 24 hr tablet, TAKE 1 tablet daily, Disp: 90 tablet, Rfl: 0 .  Cholecalciferol (VITAMIN D3) 10000 units TABS, Take 20,000 Units by mouth daily. , Disp: , Rfl:  .  diclofenac sodium (VOLTAREN) 1 % GEL, Apply 2 g topically 4 (four) times  daily., Disp: 100 g, Rfl: 3 .  famotidine (PEPCID) 10 MG tablet, Take 10 mg by mouth 2 (two) times daily., Disp: , Rfl:  .  gabapentin (NEURONTIN) 300 MG capsule, Take 1 capsule (300 mg total) by mouth 3 (three) times daily as needed., Disp: 90 capsule, Rfl: 1 .  HYDROcodone-acetaminophen (NORCO) 5-325 MG tablet, Take 1 tablet by mouth every 6 (six) hours as needed for moderate pain., Disp: 20 tablet, Rfl: 0 .  losartan (COZAAR) 50 MG tablet,  Take 1 tablet (50 mg total) by mouth daily., Disp: 90 tablet, Rfl: 0 .  Multiple Vitamin (MULTI-VITAMIN PO), Take 1 tablet by mouth daily., Disp: , Rfl:  .  valACYclovir (VALTREX) 500 MG tablet, TAKE 1 TABLET BY MOUTH TWICE DAILY., Disp: 10 tablet, Rfl: prn .  zolpidem (AMBIEN) 5 MG tablet, TAKE 1 TABLET AT BEDTIME AS NEEDED FOR SLEEP., Disp: 90 tablet, Rfl: 0 .  docusate sodium (COLACE) 100 MG capsule, Take 100 mg by mouth daily., Disp: , Rfl:   Allergies  Allergen Reactions  . Mushroom Extract Complex Anaphylaxis and Hives  . Codeine Nausea Only  . Dextromethorphan Nausea And Vomiting  . Levaquin [Levofloxacin In D5w] Other (See Comments)    Myalgias  . Macrobid  [Nitrofurantoin Macrocrystal] Nausea And Vomiting  . Tape Itching and Other (See Comments)    Please use paper tape  . Tequila Sunrise Flavor Nausea And Vomiting  . Tessalon Perles Nausea And Vomiting    Objective:   VITALS: Per patient if applicable, see vitals. GENERAL: Alert, appears well and in no acute distress. HEENT: Atraumatic, conjunctiva clear, no obvious abnormalities on inspection of external nose and ears. NECK: Normal movements of the head and neck. CARDIOPULMONARY: No increased WOB. Speaking in clear sentences. I:E ratio WNL.  MS: Moves all visible extremities without noticeable abnormality. PSYCH: Pleasant and cooperative, well-groomed. Speech normal rate and rhythm. Affect is appropriate. Insight and judgement are appropriate. Attention is focused, linear, and appropriate.  NEURO: CN grossly intact. Oriented as arrived to appointment on time with no prompting. Moves both UE equally.  SKIN: No obvious lesions, wounds, erythema, or cyanosis noted on face or hands.  Assessment and Plan:   Stephanie Andrade was seen today for chest pain, gastroesophageal reflux and panic attack/grief.  Diagnoses and all orders for this visit:  Situational anxiety Denies SI/HI. We briefly discussed medication for sleep (doesn't  like to take ambien when alone) and she declined. She states that medications that cause any sort of sedation usually cause "severe sedation" with her and she is reluctant to try anything for this. I discussed with patient that if they develop any SI, to tell someone immediately and seek medical attention. I have also sent a message to Trey Paula, our office counselor, to see if she can reach out to her for an appointment. Follow-up with [place] on Monday.  Feeling of chest tightness Suspect possible GERD. No exertional component at this time, however I did discuss that if she has any sort of change in symptom, VERY LOW threshold to go to the ER. Start pepcid (which she already has at home) 10 mg BID. Follow-up with [place] on Monday.  . Reviewed expectations re: course of current medical issues. . Discussed self-management of symptoms. . Outlined signs and symptoms indicating need for more acute intervention. . Patient verbalized understanding and all questions were answered. Marland Kitchen Health Maintenance issues including appropriate healthy diet, exercise, and smoking avoidance were discussed with patient. . See orders for this visit  as documented in the electronic medical record.  I discussed the assessment and treatment plan with the patient. The patient was provided an opportunity to ask questions and all were answered. The patient agreed with the plan and demonstrated an understanding of the instructions.   The patient was advised to call back or seek an in-person evaluation if the symptoms worsen or if the condition fails to improve as anticipated.  [place], [place] 02/24/2019

## 2019-02-27 ENCOUNTER — Encounter: Payer: Self-pay | Admitting: Physician Assistant

## 2019-02-27 ENCOUNTER — Telehealth: Payer: Self-pay | Admitting: Physician Assistant

## 2019-02-27 ENCOUNTER — Ambulatory Visit (INDEPENDENT_AMBULATORY_CARE_PROVIDER_SITE_OTHER): Payer: BC Managed Care – PPO | Admitting: Physician Assistant

## 2019-02-27 VITALS — Ht 68.0 in

## 2019-02-27 DIAGNOSIS — Z20828 Contact with and (suspected) exposure to other viral communicable diseases: Secondary | ICD-10-CM | POA: Diagnosis not present

## 2019-02-27 DIAGNOSIS — F418 Other specified anxiety disorders: Secondary | ICD-10-CM

## 2019-02-27 DIAGNOSIS — Z7189 Other specified counseling: Secondary | ICD-10-CM | POA: Diagnosis not present

## 2019-02-27 DIAGNOSIS — Z20822 Contact with and (suspected) exposure to covid-19: Secondary | ICD-10-CM

## 2019-02-27 NOTE — Addendum Note (Signed)
Addended by: Benson Setting L on: 02/27/2019 10:30 AM   Modules accepted: Orders

## 2019-02-27 NOTE — Progress Notes (Signed)
Virtual Visit via Video   I connected with Stephanie Andrade on 02/27/19 at 10:00 AM EDT by a video enabled telemedicine application and verified that I am speaking with the correct person using two identifiers. Location patient: Home Location provider: [facility], Office Persons participating in the virtual visit: Stephanie Andrade, [place] PA-C  I discussed the limitations of evaluation and management by telemedicine and the availability of in person appointments. The patient expressed understanding and agreed to proceed.  Subjective:   HPI:   Anxiety/Chest tightness/GERD Patient was seen by me on 02/24/19 for the above issues. We restarted her pepcid 10 mg BID. She was also referred to our office therapist, Stephanie Andrade.  Since she last saw me she has been doing better. Symptoms seem to be improving with pepcid. She is planning to return Stephanie Andrade's call today to discuss therapy options for her. She is continuing to self-quarantine as she does not know if her husband, who recently passed, truly has COVID-19. She would like testing. Currently denies: fever, cough, chills, ST, loss of taste/smell, diarrhea  GAD 7 : Generalized Anxiety Score 02/27/2019  Nervous, Anxious, on Edge 2  Control/stop worrying 0  Worry too much - different things 1  Trouble relaxing 2  Restless 0  Easily annoyed or irritable 0  Afraid - awful might happen 0  Total GAD 7 Score 5  Anxiety Difficulty Not difficult at all      ROS: See pertinent positives and negatives per HPI.  Patient Active Problem List   Diagnosis Date Noted  . Primary osteoarthritis of right knee 11/01/2018  . Pes anserine bursitis 10/18/2018  . Status post total replacement of left hip 08/24/2017  . Status post total replacement of right hip 03/24/2016  . Impaired glucose tolerance 12/09/2014  . History of vitamin D deficiency 11/15/2011  . History of sarcoidosis 11/15/2011  . Hot flashes not due to menopause 07/19/2011  .  Spinal stenosis, lumbar region, with neurogenic claudication 03/10/2011  . Hypertension 02/11/2011  . Morbid obesity ([place]) 02/11/2011  . Depression 02/11/2011  . Cervical radiculopathy 12/04/2010    Social History   Tobacco Use  . Smoking status: Former Smoker    Types: Cigarettes    Quit date: 06/20/2009    Years since quitting: 9.6  . Smokeless tobacco: Never Used  . Tobacco comment: "smoked 2 weeks out of the year for about 15 years"   Substance Use Topics  . Alcohol use: Yes    Comment: socially    Current Outpatient Medications:  .  azelastine (ASTELIN) 0.1 % nasal spray, Place into both nostrils 2 (two) times daily. Use in each nostril as directed, Disp: , Rfl:  .  buPROPion (WELLBUTRIN XL) 150 MG 24 hr tablet, TAKE 1 tablet daily, Disp: 90 tablet, Rfl: 0 .  Cholecalciferol (VITAMIN D3) 10000 units TABS, Take 20,000 Units by mouth daily. , Disp: , Rfl:  .  diclofenac sodium (VOLTAREN) 1 % GEL, Apply 2 g topically 4 (four) times daily., Disp: 100 g, Rfl: 3 .  docusate sodium (COLACE) 100 MG capsule, Take 100 mg by mouth daily., Disp: , Rfl:  .  famotidine (PEPCID) 10 MG tablet, Take 10 mg by mouth 2 (two) times daily., Disp: , Rfl:  .  gabapentin (NEURONTIN) 300 MG capsule, Take 1 capsule (300 mg total) by mouth 3 (three) times daily as needed., Disp: 90 capsule, Rfl: 1 .  HYDROcodone-acetaminophen (NORCO) 5-325 MG tablet, Take 1 tablet by mouth every 6 (  six) hours as needed for moderate pain., Disp: 20 tablet, Rfl: 0 .  losartan (COZAAR) 50 MG tablet, Take 1 tablet (50 mg total) by mouth daily., Disp: 90 tablet, Rfl: 0 .  Multiple Vitamin (MULTI-VITAMIN PO), Take 1 tablet by mouth daily., Disp: , Rfl:  .  valACYclovir (VALTREX) 500 MG tablet, TAKE 1 TABLET BY MOUTH TWICE DAILY., Disp: 10 tablet, Rfl: prn .  zolpidem (AMBIEN) 5 MG tablet, TAKE 1 TABLET AT BEDTIME AS NEEDED FOR SLEEP., Disp: 90 tablet, Rfl: 0  Allergies  Allergen Reactions  . Mushroom Extract Complex  Anaphylaxis and Hives  . Codeine Nausea Only  . Dextromethorphan Nausea And Vomiting  . Levaquin [Levofloxacin In D5w] Other (See Comments)    Myalgias  . Macrobid  [Nitrofurantoin Macrocrystal] Nausea And Vomiting  . Tape Itching and Other (See Comments)    Please use paper tape  . Tequila Sunrise Flavor Nausea And Vomiting  . Tessalon Perles Nausea And Vomiting    Objective:   VITALS: Per patient if applicable, see vitals. GENERAL: Alert, appears well and in no acute distress. HEENT: Atraumatic, conjunctiva clear, no obvious abnormalities on inspection of external nose and ears. NECK: Normal movements of the head and neck. CARDIOPULMONARY: No increased WOB. Speaking in clear sentences. I:E ratio WNL.  MS: Moves all visible extremities without noticeable abnormality. PSYCH: Pleasant and cooperative, well-groomed. Speech normal rate and rhythm. Affect is appropriate. Insight and judgement are appropriate. Attention is focused, linear, and appropriate.  NEURO: CN grossly intact. Oriented as arrived to appointment on time with no prompting. Moves both UE equally.  SKIN: No obvious lesions, wounds, erythema, or cyanosis noted on face or hands.  Assessment and Plan:   [name] was seen today for follow-up.  Diagnoses and all orders for this visit:  Situational anxiety Improving. Continue to take pepcid as scheduled. Discussed that she needs to make herself a priority and reach out to [place] today. She verbalized understanding.  Advice Given About Covid-19 Virus Infection; Possible Exposure Currently asymptomatic. We are going to send patient for drive-up testing. As a precaution, they have been advised to remain home until COVID-19 results and then possible further quarantine after that based on results and symptoms. Advised if they experience a "second sickening" or worsening symptoms as the illness progresses, they are to call the office for further instructions or seek emergent  evaluation for any severe symptoms.    . Reviewed expectations re: course of current medical issues. . Discussed self-management of symptoms. . Outlined signs and symptoms indicating need for more acute intervention. . Patient verbalized understanding and all questions were answered. Marland Kitchen Health Maintenance issues including appropriate healthy diet, exercise, and smoking avoidance were discussed with patient. . See orders for this visit as documented in the electronic medical record.  I discussed the assessment and treatment plan with the patient. The patient was provided an opportunity to ask questions and all were answered. The patient agreed with the plan and demonstrated an understanding of the instructions.   The patient was advised to call back or seek an in-person evaluation if the symptoms worsen or if the condition fails to improve as anticipated.    [place], [place] 02/27/2019

## 2019-02-27 NOTE — Telephone Encounter (Signed)
Please call patient and schedule COVID-19 testing for Friday 7/17 or after.  Thank you, Inda Coke PA-C

## 2019-02-27 NOTE — Telephone Encounter (Signed)
Contacted pt and she has been scheduled for this Friday at the [facility] testing site for 8am. Pt is aware to remain in her car and to wear a mask. Pt's mychart is active. Pt understood and had no additional questions at this time. Nothing further is needed  Order was placed.

## 2019-03-03 ENCOUNTER — Other Ambulatory Visit: Payer: Self-pay

## 2019-03-03 ENCOUNTER — Other Ambulatory Visit: Payer: BC Managed Care – PPO

## 2019-03-03 DIAGNOSIS — Z20822 Contact with and (suspected) exposure to covid-19: Secondary | ICD-10-CM

## 2019-03-03 DIAGNOSIS — R6889 Other general symptoms and signs: Secondary | ICD-10-CM | POA: Diagnosis not present

## 2019-03-07 LAB — NOVEL CORONAVIRUS, NAA: SARS-CoV-2, NAA: NOT DETECTED

## 2019-03-08 ENCOUNTER — Encounter: Payer: Self-pay | Admitting: Physician Assistant

## 2019-03-10 ENCOUNTER — Ambulatory Visit: Payer: BC Managed Care – PPO | Admitting: Physician Assistant

## 2019-03-13 ENCOUNTER — Encounter: Payer: Self-pay | Admitting: Physician Assistant

## 2019-03-13 ENCOUNTER — Ambulatory Visit (INDEPENDENT_AMBULATORY_CARE_PROVIDER_SITE_OTHER): Payer: BC Managed Care – PPO | Admitting: Physician Assistant

## 2019-03-13 DIAGNOSIS — Z7189 Other specified counseling: Secondary | ICD-10-CM | POA: Diagnosis not present

## 2019-03-13 DIAGNOSIS — F418 Other specified anxiety disorders: Secondary | ICD-10-CM

## 2019-03-13 NOTE — Progress Notes (Signed)
Virtual Visit via Video   I connected with Stephanie Andrade on 03/13/19 at  8:00 AM EDT by a video enabled telemedicine application and verified that I am speaking with the correct person using two identifiers. Location patient: Home Location provider: [facility], Office Persons participating in the virtual visit: Stephanie Andrade, Elizardo PA-C.  I discussed the limitations of evaluation and management by telemedicine and the availability of in person appointments. The patient expressed understanding and agreed to proceed.  I acted as a Education administrator for [employer], PA-C [employer], LPN  Subjective:   HPI:   Anxiety/Depression Pt wants to discuss returning to work since. She continues to have ongoing situational depression and anxiety as she grieves the loss of her husband. She is practicing strict guidelines of avoiding others, avoiding non-essential business/travel, and wearing mask. She is planning to schedule an appointment soon with Stephanie Andrade, our office therapist. She denies SI/HI. She has reservations about returning to work, both mentally and physically. She is afraid of potential exposure to COVID, as there are a significant number of people that go in and out of her work place on a daily basis. She is sleeping about 4 hours a time but will sleep throughout the day.    ROS: See pertinent positives and negatives per HPI.  Patient Active Problem List   Diagnosis Date Noted  . Primary osteoarthritis of right knee 11/01/2018  . Pes anserine bursitis 10/18/2018  . Status post total replacement of left hip 08/24/2017  . Status post total replacement of right hip 03/24/2016  . Impaired glucose tolerance 12/09/2014  . History of vitamin D deficiency 11/15/2011  . History of sarcoidosis 11/15/2011  . Hot flashes not due to menopause 07/19/2011  . Spinal stenosis, lumbar region, with neurogenic claudication 03/10/2011  . Hypertension 02/11/2011  . Morbid obesity  ([place]) 02/11/2011  . Depression 02/11/2011  . Cervical radiculopathy 12/04/2010    Social History   Tobacco Use  . Smoking status: Former Smoker    Types: Cigarettes    Quit date: 06/20/2009    Years since quitting: 9.7  . Smokeless tobacco: Never Used  . Tobacco comment: "smoked 2 weeks out of the year for about 15 years"   Substance Use Topics  . Alcohol use: Yes    Comment: socially    Current Outpatient Medications:  .  azelastine (ASTELIN) 0.1 % nasal spray, Place into both nostrils 2 (two) times daily. Use in each nostril as directed, Disp: , Rfl:  .  buPROPion (WELLBUTRIN XL) 150 MG 24 hr tablet, TAKE 1 tablet daily, Disp: 90 tablet, Rfl: 0 .  Cholecalciferol (VITAMIN D3) 10000 units TABS, Take 10,000 Units by mouth daily. , Disp: , Rfl:  .  diclofenac sodium (VOLTAREN) 1 % GEL, Apply 2 g topically 4 (four) times daily., Disp: 100 g, Rfl: 3 .  docusate sodium (COLACE) 100 MG capsule, Take 100 mg by mouth daily., Disp: , Rfl:  .  gabapentin (NEURONTIN) 300 MG capsule, Take 1 capsule (300 mg total) by mouth 3 (three) times daily as needed., Disp: 90 capsule, Rfl: 1 .  HYDROcodone-acetaminophen (NORCO) 5-325 MG tablet, Take 1 tablet by mouth every 6 (six) hours as needed for moderate pain., Disp: 20 tablet, Rfl: 0 .  losartan (COZAAR) 50 MG tablet, Take 1 tablet (50 mg total) by mouth daily., Disp: 90 tablet, Rfl: 0 .  Multiple Vitamin (MULTI-VITAMIN PO), Take 1 tablet by mouth daily., Disp: , Rfl:  .  valACYclovir (VALTREX) 500 MG tablet, TAKE 1 TABLET BY MOUTH TWICE DAILY., Disp: 10 tablet, Rfl: prn .  zolpidem (AMBIEN) 5 MG tablet, TAKE 1 TABLET AT BEDTIME AS NEEDED FOR SLEEP., Disp: 90 tablet, Rfl: 0 .  famotidine (PEPCID) 10 MG tablet, Take 10 mg by mouth 2 (two) times daily., Disp: , Rfl:   Allergies  Allergen Reactions  . Mushroom Extract Complex Anaphylaxis and Hives  . Codeine Nausea Only  . Dextromethorphan Nausea And Vomiting  . Levaquin [Levofloxacin In D5w] Other  (See Comments)    Myalgias  . Macrobid  [Nitrofurantoin Macrocrystal] Nausea And Vomiting  . Tape Itching and Other (See Comments)    Please use paper tape  . Tequila Sunrise Flavor Nausea And Vomiting  . Tessalon Perles Nausea And Vomiting    Objective:   VITALS: Per patient if applicable, see vitals. GENERAL: Alert, appears well and in no acute distress. HEENT: Atraumatic, conjunctiva clear, no obvious abnormalities on inspection of external nose and ears. NECK: Normal movements of the head and neck. CARDIOPULMONARY: No increased WOB. Speaking in clear sentences. I:E ratio WNL.  MS: Moves all visible extremities without noticeable abnormality. PSYCH: Pleasant and cooperative, well-groomed. Speech normal rate and rhythm. Affect is appropriate. Insight and judgement are appropriate. Attention is focused, linear, and appropriate.  NEURO: CN grossly intact. Oriented as arrived to appointment on time with no prompting. Moves both UE equally.  SKIN: No obvious lesions, wounds, erythema, or cyanosis noted on face or hands.  Assessment and Plan:   Stephanie Andrade was seen today for discuss rtw.  Diagnoses and all orders for this visit:  Situational anxiety; Advice Given About Covid-19 Virus Infection I discussed with patient that if they develop any SI, to tell someone immediately and seek medical attention. I have written her out of work completely for the next 3 weeks, and at that time she will have an appointment (made today) for her PCP to discuss further. Encouraged her to reach out in the interim if she needs anything. Also recommended seeing a therapist a priority for her.   . Reviewed expectations re: course of current medical issues. . Discussed self-management of symptoms. . Outlined signs and symptoms indicating need for more acute intervention. . Patient verbalized understanding and all questions were answered. Marland Kitchen Health Maintenance issues including appropriate healthy diet, exercise,  and smoking avoidance were discussed with patient. . See orders for this visit as documented in the electronic medical record.  I discussed the assessment and treatment plan with the patient. The patient was provided an opportunity to ask questions and all were answered. The patient agreed with the plan and demonstrated an understanding of the instructions.   The patient was advised to call back or seek an in-person evaluation if the symptoms worsen or if the condition fails to improve as anticipated.   CMA or LPN served as scribe during this visit. History, Physical, and Plan performed by medical provider. The above documentation has been reviewed and is accurate and complete.  I spent 25 minutes with this patient, greater than 50% was face-to-face time counseling regarding the above diagnoses.  [place], [place] 03/13/2019

## 2019-03-21 ENCOUNTER — Encounter: Payer: Self-pay | Admitting: Physician Assistant

## 2019-03-23 ENCOUNTER — Other Ambulatory Visit: Payer: Self-pay

## 2019-03-23 DIAGNOSIS — Z20822 Contact with and (suspected) exposure to covid-19: Secondary | ICD-10-CM

## 2019-03-25 LAB — NOVEL CORONAVIRUS, NAA: SARS-CoV-2, NAA: NOT DETECTED

## 2019-04-03 ENCOUNTER — Ambulatory Visit (INDEPENDENT_AMBULATORY_CARE_PROVIDER_SITE_OTHER): Payer: BC Managed Care – PPO | Admitting: Family Medicine

## 2019-04-03 ENCOUNTER — Encounter: Payer: Self-pay | Admitting: Family Medicine

## 2019-04-03 VITALS — HR 79 | Temp 97.9°F | Ht 68.0 in | Wt 252.0 lb

## 2019-04-03 DIAGNOSIS — F418 Other specified anxiety disorders: Secondary | ICD-10-CM | POA: Diagnosis not present

## 2019-04-03 DIAGNOSIS — F4321 Adjustment disorder with depressed mood: Secondary | ICD-10-CM

## 2019-04-03 MED ORDER — CLONAZEPAM 0.5 MG PO TABS: 0.5000 mg | ORAL_TABLET | Freq: Two times a day (BID) | ORAL | 1 refills | Status: DC | PRN

## 2019-04-03 NOTE — Progress Notes (Signed)
Patient: Stephanie Andrade MRN: [id number] DOB: 1956-05-09 PCP: Orland Mustard, MD     I connected with [name] on 04/03/19 at 10:15am  by a video enabled telemedicine application and verified that I am speaking with the correct person using two identifiers.  Location patient: Home Location provider: [facility], Office Persons participating in this virtual visit: Kalisi Papillon and Dr. Artis Flock   I discussed the limitations of evaluation and management by telemedicine and the availability of in person appointments. The patient expressed understanding and agreed to proceed.   Subjective:  Chief Complaint  Patient presents with  . Anxiety    HPI: The patient is a 63 y.o. female who presents today for anxiety. She lost her her husband unexpectedly in July due to possible covid vs PE vs hematological cancer. Has been seeing the PA in our office for this. She wrote her out of work for 3 weeks due to anxiety/depression/coping/grieving for this and is here to f/u. She will be  seeing lisa flores for counseling (first appointment is next week) and has been on wellbutrin for some time for depression. She is not sure she can go back to work. She is still having extreme periods of crying and can not really function or make long term decisions. She is grieving as she should. She is having bouts of daily crying that is sometimes so intense she has to pull over if driving. She had good support.   Review of Systems  Constitutional: Negative for chills, fatigue and fever.  HENT: Positive for postnasal drip and rhinorrhea.   Eyes: Negative for visual disturbance.  Respiratory: Negative for cough, chest tightness and shortness of breath.   Cardiovascular: Negative.   Gastrointestinal: Negative.   Endocrine: Positive for polyuria. Negative for polydipsia.  Genitourinary: Negative.   Musculoskeletal: Positive for arthralgias.  Skin: Negative.   Neurological: Negative.   Psychiatric/Behavioral:  Positive for sleep disturbance. The patient is nervous/anxious.     Allergies Patient is allergic to mushroom extract complex; agave; codeine; dextromethorphan; levaquin [levofloxacin in d5w]; macrobid  [nitrofurantoin macrocrystal]; tape; and tessalon perles.  Past Medical History Patient  has a past medical history of Anemia, Arthritis, Asthma, Depression, Family history of adverse reaction to anesthesia, Family history of ovarian cancer, Fibroids, GERD (gastroesophageal reflux disease), History of bronchitis, History of sarcoidosis, Hypertension, Pneumonia, PONV (postoperative nausea and vomiting) (03/26/2016), and Wears glasses.  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  Family History Pateint's family history includes Arthritis in her father and sister; Breast cancer in her maternal grandfather; Cancer in her mother; Hypertension in her brother and father.  Social History Patient  reports that she quit smoking about 9 years ago. Her smoking use included cigarettes. She has never used smokeless tobacco. She reports current alcohol use. She reports that she does not use drugs.    Objective: Vitals:   04/03/19 0918  Pulse: 79  Temp: 97.9 F (36.6 C)  TempSrc: Oral  SpO2: 95%  Weight: 252 lb (114.3 kg)  Height: 5\' 8"  (1.727 m)    Body mass index is 38.32 kg/m.  Physical Exam Vitals signs reviewed.  Constitutional:      Appearance: She is obese.  Pulmonary:     Effort: Pulmonary effort is normal.  Neurological:     General: No focal deficit present.  Mental Status: She is alert and oriented to person, place, and time.  Psychiatric:     Comments: Tearful throughout appointment  No si/hi/ah/hv         GAD 7 : Generalized  Anxiety Score 04/03/2019 02/27/2019  Nervous, Anxious, on Edge 3 2  Control/stop worrying 3 0  Worry too much - different things 2 1  Trouble relaxing 3 2  Restless 0 0  Easily annoyed or irritable 1 0  Afraid - awful might happen 3 0  Total GAD 7 Score 15 5  Anxiety Difficulty Not difficult at all Not difficult at all      Office Visit from 04/03/2019 in [facility]  PHQ-9 Total Score  10      Assessment/plan: 1. Grieving FMLA x 3 months for anxiety. Will also do klonopin prn for anxiety. Discussed not long term and she can not take with [place]. Discussed stages of grieving and that this is a year process. Covid and isolation at the hospital has made this worse. Will have her f/u in one month and sooner if needed. Really think counseling will help her as well.   2. Situational anxiety See above.     Return in about 1 month (around 05/04/2019) for grieving follow up. Orland Mustard, MD [facility]  04/03/2019

## 2019-04-12 ENCOUNTER — Ambulatory Visit (INDEPENDENT_AMBULATORY_CARE_PROVIDER_SITE_OTHER): Payer: BC Managed Care – PPO | Admitting: Psychology

## 2019-04-12 DIAGNOSIS — F320 Major depressive disorder, single episode, mild: Secondary | ICD-10-CM

## 2019-04-20 ENCOUNTER — Ambulatory Visit (INDEPENDENT_AMBULATORY_CARE_PROVIDER_SITE_OTHER): Payer: BC Managed Care – PPO | Admitting: Family Medicine

## 2019-04-20 ENCOUNTER — Encounter: Payer: Self-pay | Admitting: Family Medicine

## 2019-04-20 ENCOUNTER — Telehealth: Payer: Self-pay | Admitting: Physical Therapy

## 2019-04-20 ENCOUNTER — Other Ambulatory Visit: Payer: Self-pay

## 2019-04-20 ENCOUNTER — Other Ambulatory Visit: Payer: Self-pay | Admitting: Family Medicine

## 2019-04-20 VITALS — BP 130/76 | HR 66 | Temp 97.9°F | Resp 16 | Ht 67.5 in | Wt 253.4 lb

## 2019-04-20 DIAGNOSIS — L0100 Impetigo, unspecified: Secondary | ICD-10-CM

## 2019-04-20 DIAGNOSIS — J3489 Other specified disorders of nose and nasal sinuses: Secondary | ICD-10-CM | POA: Diagnosis not present

## 2019-04-20 MED ORDER — CEFADROXIL 500 MG PO CAPS: 500.0000 mg | ORAL_CAPSULE | Freq: Two times a day (BID) | ORAL | 0 refills | Status: DC

## 2019-04-20 MED ORDER — MUPIROCIN 2 % EX OINT: 1.0000 "application " | TOPICAL_OINTMENT | Freq: Two times a day (BID) | CUTANEOUS | 0 refills | Status: AC

## 2019-04-20 MED ORDER — CEPHALEXIN 500 MG PO CAPS: 500.0000 mg | ORAL_CAPSULE | Freq: Two times a day (BID) | ORAL | 0 refills | Status: DC

## 2019-04-20 NOTE — Telephone Encounter (Signed)
Copied from [place] [id number]. Topic: General - Other >> Apr 20, 2019 11:13 AM Jodie Echevaria wrote: Reason for CRM: Patient called to inform Dr Jonni Sanger that she was contacted by her pharmacy that they do not have the cefadroxil (DURICEF) 500 MG capsule in stock and she wanted to know if she can send something else. Also request a call back at Ph#  [phone]

## 2019-04-20 NOTE — Telephone Encounter (Signed)
Pt has not called to ask another pharmacy, asked if there is an alternative

## 2019-04-20 NOTE — Progress Notes (Signed)
Subjective  CC:  Chief Complaint  Patient presents with  . Rash    Reports that it started yesterday/Tues, with itching, redness, and some swelling.. Bilateral nostrils and outside of the right side.. Has tried some leftover Bactaban     HPI: Stephanie Andrade is a 63 y.o. female who presents to the office today to address the problems listed above in the chief complaint.  Was babysitting great nephews: told yesterday one has impetigo. Now with red sore swollen red nose and small blister/red area on chin w/o fever chill. No uri sxs, st, or cough.   Wants flu shot but is taking care of grandchildren tomorrow and has recurrent h/o getting flu like sxs with vaccine. ? Should she postpone.   Assessment  1. Nasal vestibulitis   2. Impetigo      Plan   Vestibulitis and impetigo:  Educated. bactroban and duricef orally. Supportive care  Defer flu vaccine until next week so she can premedicate with advil and take care of herself afterwards if she feels unwell. To schedule nurse visit.   Follow up: prn  Visit date not found  No orders of the defined types were placed in this encounter.  Meds ordered this encounter  Medications  . mupirocin ointment (BACTROBAN) 2 %    Sig: Place 1 application into the nose 2 (two) times daily.    Dispense:  22 g    Refill:  0  . cefadroxil (DURICEF) 500 MG capsule    Sig: Take 1 capsule (500 mg total) by mouth 2 (two) times daily.    Dispense:  20 capsule    Refill:  0      I reviewed the patients updated PMH, FH, and SocHx.    Patient Active Problem List   Diagnosis Date Noted  . Primary osteoarthritis of right knee 11/01/2018  . Pes anserine bursitis 10/18/2018  . Status post total replacement of left hip 08/24/2017  . Status post total replacement of right hip 03/24/2016  . Impaired glucose tolerance 12/09/2014  . History of vitamin D deficiency 11/15/2011  . History of sarcoidosis 11/15/2011  . Hot flashes not due to menopause  07/19/2011  . Spinal stenosis, lumbar region, with neurogenic claudication 03/10/2011  . Hypertension 02/11/2011  . Morbid obesity ([place]) 02/11/2011  . Depression 02/11/2011  . Cervical radiculopathy 12/04/2010   Current Meds  Medication Sig  . azelastine (ASTELIN) 0.1 % nasal spray Place into both nostrils 2 (two) times daily. Use in each nostril as directed  . buPROPion (WELLBUTRIN XL) 150 MG 24 hr tablet TAKE 1 tablet daily  . Cholecalciferol (VITAMIN D3) 10000 units TABS Take 10,000 Units by mouth daily.   . clonazePAM (KLONOPIN) 0.5 MG tablet Take 1 tablet (0.5 mg total) by mouth 2 (two) times daily as needed for anxiety.  . diclofenac sodium (VOLTAREN) 1 % GEL Apply 2 g topically 4 (four) times daily.  Marland Kitchen docusate sodium (COLACE) 100 MG capsule Take 100 mg by mouth daily.  Marland Kitchen gabapentin (NEURONTIN) 300 MG capsule Take 1 capsule (300 mg total) by mouth 3 (three) times daily as needed.  Marland Kitchen glucosamine-chondroitin 500-400 MG tablet Take 1 tablet by mouth 3 (three) times daily.  Marland Kitchen HYDROcodone-acetaminophen (NORCO) 5-325 MG tablet Take 1 tablet by mouth every 6 (six) hours as needed for moderate pain.  Marland Kitchen losartan (COZAAR) 50 MG tablet Take 1 tablet (50 mg total) by mouth daily.  . Multiple Vitamin (MULTI-VITAMIN PO) Take 1 tablet by mouth daily.  Marland Kitchen  valACYclovir (VALTREX) 500 MG tablet TAKE 1 TABLET BY MOUTH TWICE DAILY.  Marland Kitchen zolpidem (AMBIEN) 5 MG tablet TAKE 1 TABLET AT BEDTIME AS NEEDED FOR SLEEP.    Allergies: Patient is allergic to mushroom extract complex; agave; codeine; dextromethorphan; levaquin [levofloxacin in d5w]; macrobid  [nitrofurantoin macrocrystal]; tape; and tessalon perles. Family History: Patient family history includes Arthritis in her father and sister; Breast cancer in her maternal grandfather; Cancer in her mother; Hypertension in her brother and father. Social History:  Patient  reports that she quit smoking about 9 years ago. Her smoking use included cigarettes. She  has never used smokeless tobacco. She reports current alcohol use. She reports that she does not use drugs.  Review of Systems: Constitutional: Negative for fever malaise or anorexia Cardiovascular: negative for chest pain Respiratory: negative for SOB or persistent cough Gastrointestinal: negative for abdominal pain  Objective  Vitals: BP 130/76   Pulse 66   Temp 97.9 F (36.6 C) (Tympanic)   Resp 16   Ht 5' 7.5" (1.715 m)   Wt 253 lb 6.4 oz (114.9 kg)   LMP 04/06/2011 (Exact Date)   SpO2 99%   BMI 39.10 kg/m  General: no acute distress , A&Ox3 HEENT: PEERL, conjunctiva normal, Oropharynx moist and clear, no cervical LAD, right swollen red nare and tip of nose w/o rash or blisters or crusting, chin with tiny red nodule,neck is supple Cardiovascular:  RRR without murmur or gallop.  Respiratory:  Good breath sounds bilaterally, CTAB with normal respiratory effort Skin:  Warm, no rashes     Commons side effects, risks, benefits, and alternatives for medications and treatment plan prescribed today were discussed, and the patient expressed understanding of the given instructions. Patient is instructed to call or message via MyChart if he/she has any questions or concerns regarding our treatment plan. No barriers to understanding were identified. We discussed Red Flag symptoms and signs in detail. Patient expressed understanding regarding what to do in case of urgent or emergency type symptoms.   Medication list was reconciled, printed and provided to the patient in AVS. Patient instructions and summary information was reviewed with the patient as documented in the AVS. This note was prepared with assistance of Dragon voice recognition software. Occasional wrong-word or sound-a-like substitutions may have occurred due to the inherent limitations of voice recognition software

## 2019-04-20 NOTE — Addendum Note (Signed)
Addended by: Billey Chang on: 04/20/2019 02:05 PM   Modules accepted: Orders

## 2019-04-20 NOTE — Patient Instructions (Signed)
Please follow up if symptoms do not improve or as needed.  Make a nurse visit appointment for your flu shot.    Impetigo, Adult Impetigo is an infection of the skin. It commonly occurs in young children, but it can also occur in adults. The infection causes itchy blisters and sores that produce brownish-yellow fluid. As the fluid dries, it forms a thick, honey-colored crust. These skin changes usually occur on the face, but they can also affect other areas of the body. Impetigo usually goes away in 7-10 days with treatment. What are the causes? This condition is caused by two types of bacteria. It may be caused by staphylococci or streptococci bacteria. These bacteria cause impetigo when they get under the surface of the skin. This often happens after some damage to the skin, such as:  Cuts, scrapes, or scratches.  Rashes.  Insect bites, especially when you scratch the area of a bite.  Chickenpox or other illnesses that cause open skin sores.  Nail biting or chewing. Impetigo can spread easily from one person to another (is contagious). It may be spread through close skin contact or by sharing towels, clothing, or other items that an infected person has touched. What increases the risk? The following factors may make you more likely to develop this condition:  Playing sports that include skin-to-skin contact with others.  Having a skin condition with open sores, such as chickenpox.  Having diabetes.  Having a weak body defense system (immune system).  Having many skin cuts or scrapes.  Living in an area that has high humidity levels.  Having poor hygiene.  Having high levels of staphylococci in your nose. What are the signs or symptoms? The main symptom of this condition is small blisters, often on the face around the mouth and nose. In time, the blisters break open and turn into tiny sores (lesions) with a yellow crust. In some cases, the blisters cause itching or burning. With  scratching, irritation, or lack of treatment, these small lesions may get larger. Other possible symptoms include:  Larger blisters.  Pus.  Swollen lymph glands. Scratching the affected area can cause impetigo to spread to other parts of the body. The bacteria can get under the fingernails and spread when you touch another area of your skin. How is this diagnosed? This condition is usually diagnosed during a physical exam. A skin sample or a sample of fluid from a blister may be taken for lab tests that involve growing bacteria (culture test). Lab tests can help to confirm the diagnosis or help to determine the best treatment. How is this treated? Treatment for this condition depends on the severity of the condition:  Mild impetigo can be treated with prescription antibiotic cream.  Oral antibiotic medicine may be used in more severe cases.  Medicines that reduce itchiness (antihistamines)may also be used. Follow these instructions at home: Medicines  Take over-the-counter and prescription medicines only as told by your health care provider.  Apply or take your antibiotic as told by your health care provider. Do not stop using the antibiotic even if your condition improves. General instructions   To help prevent impetigo from spreading to other body areas: ? Keep your fingernails short and clean. ? Do not scratch the blisters or sores. ? Cover infected areas, if necessary, to keep from scratching. ? Wash your hands often with soap and warm water.  Before applying antibiotic cream or ointment, you should: ? Gently wash the infected areas with antibacterial soap  and warm water. ? Soak crusted areas in warm, soapy water using antibacterial soap. ? Gently rub the areas to remove crusts. Do not scrub.  Do not share towels.  Wash your clothing and bedsheets in warm water that is 140F (60C) or warmer.  Stay home until you have used an antibiotic cream for 48 hours (2 days) or an  oral antibiotic medicine for 24 hours (1 day). You should only return to work and activities with other people if your skin shows significant improvement. ? You may return to contact sports after you have used antibiotic medicine for 72 hours (3 days).  Keep all follow-up visits as told by your health care provider. This is important. How is this prevented?  Wash your hands often with soap and warm water.  Do not share towels, washcloths, clothing, bedding, or razors.  Keep your fingernails short.  Keep any cuts, scrapes, bug bites, or rashes clean and covered.  Use insect repellent to prevent bug bites. Contact a health care provider if:  You develop more blisters or sores even with treatment.  Other family members get sores.  Your skin sores are not improving after 72 hours (3 days) of treatment.  You have a fever. Get help right away if:  You see spreading redness or swelling of the skin around your sores.  You see red streaks coming from your sores.  You develop a sore throat.  The area around your rash becomes warm, red, or tender to the touch.  You have dark, reddish-brown urine.  You do not urinate often or you urinate small amounts.  You are very tired (lethargic).  You have swelling in the face, hands, or feet. Summary  Impetigo is a skin infection that causes itchy blisters and sores that produce brownish-yellow fluid. As the fluid dries, it forms a crust.  This condition is caused by staphylococci or streptococci bacteria. These bacteria cause impetigo when they get under the surface of the skin, such as through cuts, rashes, bug bites, or open sores.  Treatment for this condition may include antibiotic ointment or oral antibiotics.  To help prevent impetigo from spreading to other body areas, make sure you keep your fingernails short, avoid scratching, cover any blisters, and wash your hands often.  If you have impetigo, stay home until you have used an  antibiotic cream for 48 hours (2 days) or an oral antibiotic medicine for 24 hours (1 day). You should only return to work and activities with other people if your skin shows significant improvement. This information is not intended to replace advice given to you by your health care provider. Make sure you discuss any questions you have with your health care provider. Document Released: 08/24/2014 Document Revised: 09/13/2018 Document Reviewed: 08/25/2016 Elsevier Patient Education  2020 [employer].

## 2019-04-26 ENCOUNTER — Ambulatory Visit (INDEPENDENT_AMBULATORY_CARE_PROVIDER_SITE_OTHER): Payer: BC Managed Care – PPO | Admitting: Psychology

## 2019-04-26 DIAGNOSIS — F320 Major depressive disorder, single episode, mild: Secondary | ICD-10-CM | POA: Diagnosis not present

## 2019-04-30 DIAGNOSIS — Z23 Encounter for immunization: Secondary | ICD-10-CM | POA: Diagnosis not present

## 2019-05-04 ENCOUNTER — Other Ambulatory Visit: Payer: Self-pay

## 2019-05-04 ENCOUNTER — Encounter: Payer: Self-pay | Admitting: Family Medicine

## 2019-05-04 DIAGNOSIS — R6889 Other general symptoms and signs: Secondary | ICD-10-CM | POA: Diagnosis not present

## 2019-05-04 DIAGNOSIS — Z20822 Contact with and (suspected) exposure to covid-19: Secondary | ICD-10-CM

## 2019-05-06 LAB — NOVEL CORONAVIRUS, NAA: SARS-CoV-2, NAA: NOT DETECTED

## 2019-05-10 ENCOUNTER — Ambulatory Visit: Payer: BC Managed Care – PPO | Admitting: Psychology

## 2019-05-15 ENCOUNTER — Encounter: Payer: Self-pay | Admitting: Family Medicine

## 2019-05-15 ENCOUNTER — Other Ambulatory Visit: Payer: Self-pay

## 2019-05-15 MED ORDER — BUPROPION HCL ER (XL) 150 MG PO TB24: ORAL_TABLET | ORAL | 0 refills | Status: DC

## 2019-05-15 MED ORDER — LOSARTAN POTASSIUM 50 MG PO TABS: 50.0000 mg | ORAL_TABLET | Freq: Every day | ORAL | 0 refills | Status: DC

## 2019-05-15 MED ORDER — ZOLPIDEM TARTRATE 5 MG PO TABS: 5.0000 mg | ORAL_TABLET | Freq: Every evening | ORAL | 0 refills | Status: DC | PRN

## 2019-05-24 ENCOUNTER — Ambulatory Visit (INDEPENDENT_AMBULATORY_CARE_PROVIDER_SITE_OTHER): Payer: BC Managed Care – PPO | Admitting: Psychology

## 2019-05-24 DIAGNOSIS — F320 Major depressive disorder, single episode, mild: Secondary | ICD-10-CM | POA: Diagnosis not present

## 2019-05-30 ENCOUNTER — Other Ambulatory Visit: Payer: Self-pay

## 2019-05-30 DIAGNOSIS — Z20822 Contact with and (suspected) exposure to covid-19: Secondary | ICD-10-CM

## 2019-05-30 DIAGNOSIS — Z20828 Contact with and (suspected) exposure to other viral communicable diseases: Secondary | ICD-10-CM | POA: Diagnosis not present

## 2019-06-01 LAB — NOVEL CORONAVIRUS, NAA: SARS-CoV-2, NAA: NOT DETECTED

## 2019-06-12 ENCOUNTER — Ambulatory Visit: Payer: BC Managed Care – PPO | Admitting: Psychology

## 2019-06-22 ENCOUNTER — Ambulatory Visit: Payer: BC Managed Care – PPO | Admitting: Sports Medicine

## 2019-06-27 ENCOUNTER — Ambulatory Visit (INDEPENDENT_AMBULATORY_CARE_PROVIDER_SITE_OTHER): Payer: BC Managed Care – PPO | Admitting: Sports Medicine

## 2019-06-27 ENCOUNTER — Other Ambulatory Visit: Payer: Self-pay

## 2019-06-27 DIAGNOSIS — M1711 Unilateral primary osteoarthritis, right knee: Secondary | ICD-10-CM | POA: Diagnosis not present

## 2019-06-27 MED ORDER — HYDROCODONE-ACETAMINOPHEN 5-325 MG PO TABS: 1.0000 | ORAL_TABLET | Freq: Four times a day (QID) | ORAL | 0 refills | Status: DC | PRN

## 2019-06-27 NOTE — Assessment & Plan Note (Signed)
She has done better with exercise program Limiting her walking to reasonable levels  Work on 1 leg stability  Reck in 3 mos

## 2019-06-27 NOTE — Progress Notes (Signed)
Stephanie Andrade - 63 y.o. female MRN [id number]  Date of birth: 02-09-1956  SUBJECTIVE:   CC: Follow-up for bilateral knee pain  Patient is 63 year old female following up for a acute flare of chronic bilateral knee pain.  Patient last seen in our clinic in 10/2018 for bilateral knee pain and was planning on HA injections, however was lost to follow-up due to COVID-19.  Overall, patient stated that she has been improving since that time using NSAIDs alternating with Tylenol, as needed Norco, home knee exercises every other day, and 25 pound weight loss.  Patient states that 1 month ago, she went to the beach and noticed significantly increased pain to bilateral knees with a sharp shooting pain located in right knee.  Denies radiation of pain, locking knees, falls, trauma, knees giving out, numbness/tingling/weakness in extremities.  She states that these are very similar to the pains that she has experienced in both of her knees in the past.  She notes improvement in knee pain over the past 1 week since going on daily walks with her new puppy.   Soc Hx: husband died in the summer; lymphoma; concerns about covid;  Still difficulty coping.  ROS: 25 Lb weight loss thought 2/2 grief No fever, chills, swelling, instability, muscle pain, numbness/tingling, redness, otherwise see HPI   PMHx - Updated and reviewed.  Contributory factors include: Negative PSHx - Updated and reviewed.  Contributory factors include:  Negative FHx - Updated and reviewed.  Contributory factors include:  Negative Social Hx - Updated and reviewed. Contributory factors include: Negative Medications - reviewed   DATA REVIEWED: Prior notes  PHYSICAL EXAM:  VS: BP:114/86  HR: bpm  TEMP: ( )  RESP:   HT:5\' 8"  (172.7 cm)   WT:250 lb (113.4 kg)  BMI:38.02 PHYSICAL EXAM: Gen: NAD, alert, cooperative with exam, well-appearing  Musculoskeletal: Lower extremity: Pain with palpation to medial joint line of knees bilaterally.   Palpation over pes anserine bursa bilaterally.  Muscle strength 5/5.  2+ patellar reflexes bilaterally.  No effusion, lesion, erythema.  Sensation to light touch intact bilaterally.  Negative Murray's bilaterally, negative Lachman bilaterally.  No joint laxity appreciated.  Bilateral knee flexion to 120-135 degrees and extension to 0 degrees.  Knee flexion and extension improved since last visit;  Now 130 deg flexion on RT and 145 on left.  Getting to nearly full extension RT Much better 1 leg balance Gait: Unassisted gait with mild right-sided Trendelenburg.  Improved compared to previous visit.   ASSESSMENT & PLAN:   #Primary OA of bilateral knees -Moderate tricompartmental OA bilaterally based on x-rays, physical exam, patient presentation -Acute flare on chronic pain, mildly improving -Continue daily walking and encouraged elliptical/stationary bike/water aerobics activity -Start knee exercises every other day -Alternate Tylenol and NSAIDs as needed for daily pain relief and Norco 5 mg every 6 hours as needed for breakthrough pain (used 20 in last 5 months) -Follow-up 3 mos. for acute flares to discuss corticosteroid injections, steroid Dosepaks, or to discuss Synvisc injections

## 2019-06-28 ENCOUNTER — Ambulatory Visit (INDEPENDENT_AMBULATORY_CARE_PROVIDER_SITE_OTHER): Payer: BC Managed Care – PPO | Admitting: Psychology

## 2019-06-28 DIAGNOSIS — F320 Major depressive disorder, single episode, mild: Secondary | ICD-10-CM

## 2019-07-17 DIAGNOSIS — Z03818 Encounter for observation for suspected exposure to other biological agents ruled out: Secondary | ICD-10-CM | POA: Diagnosis not present

## 2019-07-27 ENCOUNTER — Ambulatory Visit: Payer: BC Managed Care – PPO | Admitting: Psychology

## 2019-08-02 DIAGNOSIS — Z20818 Contact with and (suspected) exposure to other bacterial communicable diseases: Secondary | ICD-10-CM | POA: Diagnosis not present

## 2019-08-03 DIAGNOSIS — Z20818 Contact with and (suspected) exposure to other bacterial communicable diseases: Secondary | ICD-10-CM | POA: Diagnosis not present

## 2019-08-07 ENCOUNTER — Ambulatory Visit: Payer: BC Managed Care – PPO | Attending: Internal Medicine

## 2019-08-07 DIAGNOSIS — Z20822 Contact with and (suspected) exposure to covid-19: Secondary | ICD-10-CM

## 2019-08-07 DIAGNOSIS — Z20828 Contact with and (suspected) exposure to other viral communicable diseases: Secondary | ICD-10-CM | POA: Diagnosis not present

## 2019-08-08 LAB — NOVEL CORONAVIRUS, NAA: SARS-CoV-2, NAA: NOT DETECTED

## 2019-08-28 ENCOUNTER — Other Ambulatory Visit: Payer: Self-pay | Admitting: Family Medicine

## 2019-09-05 ENCOUNTER — Ambulatory Visit: Payer: BC Managed Care – PPO | Attending: Internal Medicine

## 2019-09-07 ENCOUNTER — Ambulatory Visit: Payer: BC Managed Care – PPO

## 2019-09-25 ENCOUNTER — Ambulatory Visit: Payer: Self-pay

## 2019-09-26 ENCOUNTER — Ambulatory Visit: Payer: BC Managed Care – PPO | Attending: Internal Medicine

## 2019-09-26 DIAGNOSIS — Z23 Encounter for immunization: Secondary | ICD-10-CM

## 2019-09-26 NOTE — Progress Notes (Signed)
Covid-19 Vaccination Clinic  Name:  CHANTILLY BEDNAREK    MRN: [id number] DOB: 1956/01/24  09/26/2019  Ms. Blanda was observed post Covid-19 immunization for 15 minutes without incidence. She was provided with Vaccine Information Sheet and instruction to access the V-Safe system.   Ms. Almada was instructed to call 911 with any severe reactions post vaccine: Marland Kitchen Difficulty breathing  . Swelling of your face and throat  . A fast heartbeat  . A bad rash all over your body  . Dizziness and weakness    Immunizations Administered    Name Date Dose VIS Date Route   Pfizer COVID-19 Vaccine 09/26/2019 11:20 AM 0.3 mL 07/28/2019 Intramuscular   Manufacturer: [employer], [employer]   Lot: [id number]   NDC: [address]-9

## 2019-11-23 ENCOUNTER — Other Ambulatory Visit: Payer: Self-pay | Admitting: Family Medicine

## 2019-11-23 NOTE — Telephone Encounter (Signed)
[facility] Database Verified LR: 08-28-2019 Qty: 49 Last office visit: 04-20-2019 ( Dr. Jonni Sanger) Upcoming appointment: No pending appt

## 2020-01-09 DIAGNOSIS — Z780 Asymptomatic menopausal state: Secondary | ICD-10-CM | POA: Diagnosis not present

## 2020-01-09 DIAGNOSIS — Z8049 Family history of malignant neoplasm of other genital organs: Secondary | ICD-10-CM | POA: Diagnosis not present

## 2020-01-09 DIAGNOSIS — N951 Menopausal and female climacteric states: Secondary | ICD-10-CM | POA: Diagnosis not present

## 2020-01-09 DIAGNOSIS — Z842 Family history of other diseases of the genitourinary system: Secondary | ICD-10-CM | POA: Diagnosis not present

## 2020-01-09 DIAGNOSIS — Z1231 Encounter for screening mammogram for malignant neoplasm of breast: Secondary | ICD-10-CM | POA: Diagnosis not present

## 2020-01-09 DIAGNOSIS — Z01419 Encounter for gynecological examination (general) (routine) without abnormal findings: Secondary | ICD-10-CM | POA: Diagnosis not present

## 2020-01-09 DIAGNOSIS — Z1151 Encounter for screening for human papillomavirus (HPV): Secondary | ICD-10-CM | POA: Diagnosis not present

## 2020-01-09 DIAGNOSIS — Z130 Encounter for screening for diseases of the blood and blood-forming organs and certain disorders involving the immune mechanism: Secondary | ICD-10-CM | POA: Diagnosis not present

## 2020-01-09 DIAGNOSIS — N952 Postmenopausal atrophic vaginitis: Secondary | ICD-10-CM | POA: Diagnosis not present

## 2020-01-09 DIAGNOSIS — Z124 Encounter for screening for malignant neoplasm of cervix: Secondary | ICD-10-CM | POA: Diagnosis not present

## 2020-03-04 ENCOUNTER — Other Ambulatory Visit: Payer: Self-pay | Admitting: Family Medicine

## 2020-03-13 ENCOUNTER — Ambulatory Visit: Payer: BC Managed Care – PPO | Attending: Internal Medicine

## 2020-03-13 DIAGNOSIS — Z20822 Contact with and (suspected) exposure to covid-19: Secondary | ICD-10-CM

## 2020-03-14 LAB — NOVEL CORONAVIRUS, NAA: SARS-CoV-2, NAA: NOT DETECTED

## 2020-03-14 LAB — SARS-COV-2, NAA 2 DAY TAT

## 2020-03-20 ENCOUNTER — Ambulatory Visit (INDEPENDENT_AMBULATORY_CARE_PROVIDER_SITE_OTHER): Payer: BC Managed Care – PPO | Admitting: Family Medicine

## 2020-03-20 ENCOUNTER — Encounter: Payer: Self-pay | Admitting: Family Medicine

## 2020-03-20 ENCOUNTER — Other Ambulatory Visit: Payer: Self-pay

## 2020-03-20 VITALS — BP 132/60 | HR 72 | Temp 96.8°F | Ht 68.0 in | Wt 233.8 lb

## 2020-03-20 DIAGNOSIS — Z23 Encounter for immunization: Secondary | ICD-10-CM | POA: Diagnosis not present

## 2020-03-20 DIAGNOSIS — F320 Major depressive disorder, single episode, mild: Secondary | ICD-10-CM

## 2020-03-20 DIAGNOSIS — R7302 Impaired glucose tolerance (oral): Secondary | ICD-10-CM

## 2020-03-20 DIAGNOSIS — Z0000 Encounter for general adult medical examination without abnormal findings: Secondary | ICD-10-CM | POA: Diagnosis not present

## 2020-03-20 DIAGNOSIS — Z0001 Encounter for general adult medical examination with abnormal findings: Secondary | ICD-10-CM | POA: Diagnosis not present

## 2020-03-20 DIAGNOSIS — I10 Essential (primary) hypertension: Secondary | ICD-10-CM | POA: Diagnosis not present

## 2020-03-20 DIAGNOSIS — R252 Cramp and spasm: Secondary | ICD-10-CM

## 2020-03-20 DIAGNOSIS — Z1211 Encounter for screening for malignant neoplasm of colon: Secondary | ICD-10-CM

## 2020-03-20 MED ORDER — BUPROPION HCL ER (XL) 150 MG PO TB24: ORAL_TABLET | ORAL | 1 refills | Status: DC

## 2020-03-20 MED ORDER — BACLOFEN 10 MG PO TABS: 10.0000 mg | ORAL_TABLET | Freq: Three times a day (TID) | ORAL | 0 refills | Status: DC

## 2020-03-20 MED ORDER — LOSARTAN POTASSIUM 50 MG PO TABS: 50.0000 mg | ORAL_TABLET | Freq: Every day | ORAL | 3 refills | Status: DC

## 2020-03-20 MED ORDER — AZELASTINE HCL 0.1 % NA SOLN: 1.0000 | Freq: Two times a day (BID) | NASAL | 3 refills | Status: AC

## 2020-03-20 NOTE — Progress Notes (Signed)
Patient: Stephanie Andrade MRN: [id number] DOB: 05/10/1956 PCP: Orland Mustard, MD     Subjective:  Chief Complaint  Patient presents with  . Annual Exam  . Hypertension  . Depression  . impaired glucose    HPI: The patient is a 64 y.o. female who presents today for annual exam. She denies any changes to past medical history. There have been no recent hospitalizations. They are following a well balanced diet and exercise plan. She walks 4 times daily with her dog. She also does water aerobics twice a week. Weight has been stable. No complaints today.   Hypertension: Here for follow up of hypertension.  Currently on cozaar 50mg /day. Takes medication as prescribed and denies any side effects. Exercise includes walking. Weight has been stable. Denies any chest pain, headaches, shortness of breath, vision changes, swelling in lower extremities.   Impaired glucose: due for a1c, has lost a lot of weight and is really doing well with exercise.   Depression Currently on wellbutrin and is doing much better. One year anniversary of her husbands death. Coping better. Has a dog and good support.   Muscle cramping Has been walking so much lately and will have bad cramping about every 10 days. Interested in muscle relaxer. No claudication.   Immunization History  Administered Date(s) Administered  . Influenza Whole 04/30/2019  . PFIZER SARS-COV-2 Vaccination 09/06/2019, 09/26/2019  . Pneumococcal Conjugate-13 12/28/2017  . Pneumococcal Polysaccharide-23 04/25/2012  . Tdap 09/20/2009, 03/20/2020   Colonoscopy: had at age 88 years.  Mammogram: 01/2020 Pap smear: 01/2020 Tdap: due and giving today  Review of Systems  Constitutional: Negative for appetite change, chills, fatigue and fever.  HENT: Negative for dental problem, ear pain, hearing loss and trouble swallowing.   Eyes: Negative for visual disturbance.  Respiratory: Negative for cough, chest tightness and shortness of breath.    Cardiovascular: Negative for chest pain, palpitations and leg swelling.  Gastrointestinal: Negative for abdominal pain, blood in stool, diarrhea and nausea.  Endocrine: Negative for cold intolerance, polydipsia, polyphagia and polyuria.  Genitourinary: Negative for dysuria, frequency, hematuria, pelvic pain and urgency.  Musculoskeletal: Negative for arthralgias.  Skin: Negative for rash.  Neurological: Negative for dizziness and headaches.  Psychiatric/Behavioral: Negative for dysphoric mood and sleep disturbance. The patient is not nervous/anxious.     Allergies Patient is allergic to mushroom extract complex, agave, codeine, dextromethorphan, levaquin [levofloxacin in d5w], macrobid  [nitrofurantoin macrocrystal], tape, and tessalon perles.  Past Medical History Patient  has a past medical history of Anemia, Arthritis, Asthma, Depression, Family history of adverse reaction to anesthesia, Family history of ovarian cancer, Fibroids, GERD (gastroesophageal reflux disease), History of bronchitis, History of sarcoidosis, Hypertension, Pneumonia, PONV (postoperative nausea and vomiting) (03/26/2016), and Wears glasses.  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  Family History Pateint's family history includes Arthritis in her father and sister; Breast cancer in her maternal grandfather; Cancer in her mother; Hypertension in her brother and father.  Social History Patient  reports that she quit smoking about 10 years ago. Her smoking use included cigarettes. She has never used smokeless tobacco. She reports current alcohol use. She reports that she does not use drugs.    Objective: Vitals:   03/20/20 0925  BP: 132/60  Pulse: 72  Temp: (!)  96.8 F (36 C)  TempSrc: Temporal  SpO2: 99%  Weight: 233 lb 12.8 oz (106.1 kg)  Height: 5\' 8"  (1.727 m)    Body mass index is 35.55 kg/m.  Physical Exam Vitals reviewed.  Constitutional:      Appearance: Normal appearance. She is well-developed. She is obese.  HENT:     Head: Normocephalic and atraumatic.     Right Ear: Tympanic membrane, ear canal and external ear normal.     Left Ear: Tympanic membrane, ear canal and external ear normal.  Eyes:     Extraocular Movements: Extraocular movements intact.     Conjunctiva/sclera: Conjunctivae normal.     Pupils: Pupils are equal, round, and reactive to light.  Neck:     Thyroid: No thyromegaly.  Cardiovascular:     Rate and Rhythm: Normal rate and regular rhythm.     Heart sounds: Normal heart sounds. No murmur heard.   Pulmonary:     Effort: Pulmonary effort is normal.     Breath sounds: Normal breath sounds.  Abdominal:     General: Bowel sounds are normal. There is no distension.     Palpations: Abdomen is soft.     Tenderness: There is no abdominal tenderness.  Musculoskeletal:     Cervical back: Normal range of motion and neck supple.  Lymphadenopathy:     Cervical: No cervical adenopathy.  Skin:    General: Skin is warm and dry.     Capillary Refill: Capillary refill takes less than 2 seconds.     Findings: No rash.  Neurological:     General: No focal deficit present.     Mental Status: She is alert and oriented to person, place, and time.     Cranial Nerves: No cranial nerve deficit.     Coordination: Coordination normal.     Deep Tendon Reflexes: Reflexes normal.  Psychiatric:        Mood and Affect: Mood normal.        Behavior: Behavior normal.      Office Visit from 03/20/2020 in [facility]  PHQ-9 Total Score 7          Assessment/plan: 1. Annual physical exam Routine labs today. Hm reviewed. Will do cologaurd for colon cancer screening and tdap today. She is doing a  wonderful job on weight loss and diet.  Patient counseling [x]    Nutrition: Stressed importance of moderation in sodium/caffeine intake, saturated fat and cholesterol, caloric balance, sufficient intake of fresh fruits, vegetables, fiber, calcium, iron, and 1 mg of folate supplement per day (for females capable of pregnancy).  [x]    Stressed the importance of regular exercise.   []    Substance Abuse: Discussed cessation/primary prevention of tobacco, alcohol, or other drug use; driving or other dangerous activities under the influence; availability of treatment for abuse.   [x]    Injury prevention: Discussed safety belts, safety helmets, smoke detector, smoking near bedding or upholstery.   [x]    Sexuality: Discussed sexually transmitted diseases, partner selection, use of condoms, avoidance of unintended pregnancy  and contraceptive alternatives.  [x]    Dental health: Discussed importance of regular tooth brushing, flossing, and dental visits.  [x]    Health maintenance and immunizations reviewed. Please refer to Health maintenance section.     2. Essential hypertension Blood pressure is to goal. Continue current anti-hypertensive medications. Refills given and routine lab work will be done today. Recommended routine exercise and healthy diet including DASH diet and mediterranean diet. Encouraged weight loss. F/u in 6 months.    3. Impaired glucose  tolerance Doing amazing and weight loss. Checking a1c today.   4. Current mild episode of major depressive disorder, unspecified whether recurrent (HCC) phq9 score mild at 7. Doing well on wellbutrin and at one year of grieving. F/u in 6 months.   5. Muscle cramping -baclofen prn. Renal function wnl. She is to let me know if any issues.   6. Need for tdap  7. Colon cancer screening -cologaurd   This visit occurred during the SARS-CoV-2 public health emergency.  Safety protocols were in place, including screening questions prior to the visit,  additional usage of staff PPE, and extensive cleaning of exam room while observing appropriate contact time as indicated for disinfecting solutions.      Return in about 6 months (around 09/20/2020) for depression .     Orland Mustard, MD [facility]  03/20/2020

## 2020-03-20 NOTE — Patient Instructions (Signed)

## 2020-03-22 LAB — LIPID PANEL
Cholesterol: 151 mg/dL (ref <200)
HDL: 66 mg/dL (ref >=50)
LDL Cholesterol (Calc): 68 mg/dL (calc)
Non-HDL Cholesterol (Calc): 85 mg/dL (calc) (ref <130)
Total CHOL/HDL Ratio: 2.3 (calc) (ref <5.0)
Triglycerides: 86 mg/dL (ref <150)

## 2020-03-22 LAB — TSH: TSH: 0.64 mIU/L (ref 0.40–4.50)

## 2020-03-22 LAB — COMPREHENSIVE METABOLIC PANEL
AG Ratio: 1.5 (calc) (ref 1.0–2.5)
ALT: 12 U/L (ref 6–29)
AST: 16 U/L (ref 10–35)
Albumin: 4.4 g/dL (ref 3.6–5.1)
Alkaline phosphatase (APISO): 81 U/L (ref 37–153)
BUN: 22 mg/dL (ref 7–25)
CO2: 28 mmol/L (ref 20–32)
Calcium: 9.8 mg/dL (ref 8.6–10.4)
Chloride: 104 mmol/L (ref 98–110)
Creat: 0.74 mg/dL (ref 0.50–0.99)
Globulin: 2.9 g/dL (calc) (ref 1.9–3.7)
Glucose, Bld: 87 mg/dL (ref 65–99)
Potassium: 5.1 mmol/L (ref 3.5–5.3)
Sodium: 140 mmol/L (ref 135–146)
Total Bilirubin: 0.4 mg/dL (ref 0.2–1.2)
Total Protein: 7.3 g/dL (ref 6.1–8.1)

## 2020-03-22 LAB — CBC WITH DIFFERENTIAL/PLATELET
Absolute Monocytes: 426 cells/uL (ref 200–950)
Basophils Absolute: 30 cells/uL (ref 0–200)
Basophils Relative: 0.5 %
Eosinophils Absolute: 150 cells/uL (ref 15–500)
Eosinophils Relative: 2.5 %
HCT: 39.3 % (ref 35.0–45.0)
Hemoglobin: 12.6 g/dL (ref 11.7–15.5)
Lymphs Abs: 1962 cells/uL (ref 850–3900)
MCH: 26.9 pg — ABNORMAL LOW (ref 27.0–33.0)
MCHC: 32.1 g/dL (ref 32.0–36.0)
MCV: 83.8 fL (ref 80.0–100.0)
MPV: 11 fL (ref 7.5–12.5)
Monocytes Relative: 7.1 %
Neutro Abs: 3432 cells/uL (ref 1500–7800)
Neutrophils Relative %: 57.2 %
Platelets: 253 10*3/uL (ref 140–400)
RBC: 4.69 10*6/uL (ref 3.80–5.10)
RDW: 12.8 % (ref 11.0–15.0)
Total Lymphocyte: 32.7 %
WBC: 6 10*3/uL (ref 3.8–10.8)

## 2020-03-22 LAB — MICROALBUMIN / CREATININE URINE RATIO

## 2020-04-05 ENCOUNTER — Other Ambulatory Visit: Payer: Self-pay

## 2020-04-05 ENCOUNTER — Other Ambulatory Visit: Payer: BC Managed Care – PPO

## 2020-04-05 DIAGNOSIS — Z20822 Contact with and (suspected) exposure to covid-19: Secondary | ICD-10-CM | POA: Diagnosis not present

## 2020-04-06 LAB — SARS-COV-2, NAA 2 DAY TAT

## 2020-04-06 LAB — NOVEL CORONAVIRUS, NAA: SARS-CoV-2, NAA: NOT DETECTED

## 2020-04-07 ENCOUNTER — Encounter: Payer: Self-pay | Admitting: Family Medicine

## 2020-04-08 ENCOUNTER — Other Ambulatory Visit: Payer: Self-pay

## 2020-04-08 MED ORDER — VALACYCLOVIR HCL 500 MG PO TABS: 500.0000 mg | ORAL_TABLET | Freq: Two times a day (BID) | ORAL | 99 refills | Status: DC

## 2020-04-08 MED ORDER — EPINEPHRINE 0.3 MG/0.3ML IJ SOAJ: 0.3000 mg | INTRAMUSCULAR | 1 refills | Status: DC | PRN

## 2020-04-26 ENCOUNTER — Other Ambulatory Visit: Payer: BC Managed Care – PPO

## 2020-04-26 ENCOUNTER — Other Ambulatory Visit: Payer: Self-pay | Admitting: Critical Care Medicine

## 2020-04-26 DIAGNOSIS — Z20822 Contact with and (suspected) exposure to covid-19: Secondary | ICD-10-CM

## 2020-04-29 LAB — NOVEL CORONAVIRUS, NAA: SARS-CoV-2, NAA: NOT DETECTED

## 2020-05-17 ENCOUNTER — Telehealth: Payer: Self-pay

## 2020-05-17 NOTE — Telephone Encounter (Signed)
Pt is wanting to check if she needs the pneumonia vaccine.   If so, can she get it the same time as the flu shot?  And, can she get the shingles shot at the same time?

## 2020-05-20 ENCOUNTER — Other Ambulatory Visit: Payer: BC Managed Care – PPO

## 2020-05-20 DIAGNOSIS — Z20822 Contact with and (suspected) exposure to covid-19: Secondary | ICD-10-CM | POA: Diagnosis not present

## 2020-05-20 NOTE — Telephone Encounter (Signed)
Called and lm on pt vm tcb. She is not due for pneumonia shot she has had them both and yes she can get the flu and shingles shot at the same time.

## 2020-05-21 LAB — SARS-COV-2, NAA 2 DAY TAT

## 2020-05-21 LAB — NOVEL CORONAVIRUS, NAA: SARS-CoV-2, NAA: NOT DETECTED

## 2020-05-22 ENCOUNTER — Ambulatory Visit: Payer: BC Managed Care – PPO

## 2020-05-28 ENCOUNTER — Encounter: Payer: Self-pay | Admitting: Family Medicine

## 2020-05-28 ENCOUNTER — Ambulatory Visit (INDEPENDENT_AMBULATORY_CARE_PROVIDER_SITE_OTHER): Payer: BC Managed Care – PPO

## 2020-05-28 ENCOUNTER — Other Ambulatory Visit: Payer: Self-pay

## 2020-05-28 DIAGNOSIS — Z23 Encounter for immunization: Secondary | ICD-10-CM

## 2020-07-01 ENCOUNTER — Other Ambulatory Visit: Payer: BC Managed Care – PPO

## 2020-07-01 DIAGNOSIS — Z20822 Contact with and (suspected) exposure to covid-19: Secondary | ICD-10-CM | POA: Diagnosis not present

## 2020-07-02 LAB — NOVEL CORONAVIRUS, NAA: SARS-CoV-2, NAA: NOT DETECTED

## 2020-07-02 LAB — SARS-COV-2, NAA 2 DAY TAT

## 2020-08-20 ENCOUNTER — Other Ambulatory Visit: Payer: BC Managed Care – PPO

## 2020-08-20 DIAGNOSIS — Z20822 Contact with and (suspected) exposure to covid-19: Secondary | ICD-10-CM

## 2020-08-22 LAB — SARS-COV-2, NAA 2 DAY TAT

## 2020-08-22 LAB — NOVEL CORONAVIRUS, NAA: SARS-CoV-2, NAA: NOT DETECTED

## 2020-09-10 ENCOUNTER — Other Ambulatory Visit: Payer: BC Managed Care – PPO

## 2020-09-12 ENCOUNTER — Other Ambulatory Visit: Payer: BC Managed Care – PPO

## 2020-09-12 DIAGNOSIS — Z20822 Contact with and (suspected) exposure to covid-19: Secondary | ICD-10-CM | POA: Diagnosis not present

## 2020-09-13 LAB — SARS-COV-2, NAA 2 DAY TAT

## 2020-09-13 LAB — NOVEL CORONAVIRUS, NAA: SARS-CoV-2, NAA: NOT DETECTED

## 2020-09-23 ENCOUNTER — Ambulatory Visit: Payer: BC Managed Care – PPO | Admitting: Family Medicine

## 2020-09-26 ENCOUNTER — Other Ambulatory Visit: Payer: Self-pay

## 2020-09-26 ENCOUNTER — Ambulatory Visit (INDEPENDENT_AMBULATORY_CARE_PROVIDER_SITE_OTHER): Payer: BC Managed Care – PPO | Admitting: Family Medicine

## 2020-09-26 ENCOUNTER — Encounter: Payer: Self-pay | Admitting: Family Medicine

## 2020-09-26 VITALS — BP 118/60 | HR 90 | Temp 98.1°F | Ht 68.0 in | Wt 247.4 lb

## 2020-09-26 DIAGNOSIS — F320 Major depressive disorder, single episode, mild: Secondary | ICD-10-CM

## 2020-09-26 DIAGNOSIS — I10 Essential (primary) hypertension: Secondary | ICD-10-CM | POA: Diagnosis not present

## 2020-09-26 DIAGNOSIS — R202 Paresthesia of skin: Secondary | ICD-10-CM

## 2020-09-26 DIAGNOSIS — R7303 Prediabetes: Secondary | ICD-10-CM | POA: Diagnosis not present

## 2020-09-26 DIAGNOSIS — R42 Dizziness and giddiness: Secondary | ICD-10-CM

## 2020-09-26 DIAGNOSIS — L570 Actinic keratosis: Secondary | ICD-10-CM

## 2020-09-26 DIAGNOSIS — M791 Myalgia, unspecified site: Secondary | ICD-10-CM | POA: Diagnosis not present

## 2020-09-26 LAB — CBC WITH DIFFERENTIAL/PLATELET
Basophils Absolute: 0 10*3/uL (ref 0.0–0.1)
Basophils Relative: 0.9 % (ref 0.0–3.0)
Eosinophils Absolute: 0.2 10*3/uL (ref 0.0–0.7)
Eosinophils Relative: 3 % (ref 0.0–5.0)
HCT: 38.7 % (ref 36.0–46.0)
Hemoglobin: 12.7 g/dL (ref 12.0–15.0)
Lymphocytes Relative: 39.6 % (ref 12.0–46.0)
Lymphs Abs: 2.1 10*3/uL (ref 0.7–4.0)
MCHC: 32.8 g/dL (ref 30.0–36.0)
MCV: 80 fl (ref 78.0–100.0)
Monocytes Absolute: 0.3 10*3/uL (ref 0.1–1.0)
Monocytes Relative: 5.5 % (ref 3.0–12.0)
Neutro Abs: 2.8 10*3/uL (ref 1.4–7.7)
Neutrophils Relative %: 51 % (ref 43.0–77.0)
Platelets: 224 10*3/uL (ref 150.0–400.0)
RBC: 4.83 Mil/uL (ref 3.87–5.11)
RDW: 13.8 % (ref 11.5–15.5)
WBC: 5.4 10*3/uL (ref 4.0–10.5)

## 2020-09-26 LAB — VITAMIN B12: Vitamin B-12: 271 pg/mL (ref 211–911)

## 2020-09-26 LAB — COMPREHENSIVE METABOLIC PANEL
ALT: 12 U/L (ref 0–35)
AST: 14 U/L (ref 0–37)
Albumin: 4.3 g/dL (ref 3.5–5.2)
Alkaline Phosphatase: 79 U/L (ref 39–117)
BUN: 24 mg/dL — ABNORMAL HIGH (ref 6–23)
CO2: 30 mEq/L (ref 19–32)
Calcium: 9.5 mg/dL (ref 8.4–10.5)
Chloride: 103 mEq/L (ref 96–112)
Creatinine, Ser: 0.82 mg/dL (ref 0.40–1.20)
GFR: 76.02 mL/min (ref >60.00)
Glucose, Bld: 92 mg/dL (ref 70–99)
Potassium: 4.7 mEq/L (ref 3.5–5.1)
Sodium: 141 mEq/L (ref 135–145)
Total Bilirubin: 0.5 mg/dL (ref 0.2–1.2)
Total Protein: 7.5 g/dL (ref 6.0–8.3)

## 2020-09-26 LAB — CK: Total CK: 84 U/L (ref 7–177)

## 2020-09-26 LAB — HEMOGLOBIN A1C: Hgb A1c MFr Bld: 5.7 % (ref 4.6–6.5)

## 2020-09-26 LAB — TSH: TSH: 0.66 u[IU]/mL (ref 0.35–4.50)

## 2020-09-26 MED ORDER — ZOLPIDEM TARTRATE 5 MG PO TABS: 5.0000 mg | ORAL_TABLET | Freq: Every evening | ORAL | 0 refills | Status: DC | PRN

## 2020-09-26 MED ORDER — METHOCARBAMOL 500 MG PO TABS: 500.0000 mg | ORAL_TABLET | Freq: Three times a day (TID) | ORAL | 1 refills | Status: DC

## 2020-09-26 MED ORDER — GABAPENTIN 300 MG PO CAPS: 300.0000 mg | ORAL_CAPSULE | Freq: Three times a day (TID) | ORAL | 1 refills | Status: DC | PRN

## 2020-09-26 NOTE — Patient Instructions (Addendum)
1) I stopped your baclofen and we will try a different muscle relaxer called robaxin. Typically this doesn't make you drowsy. It's not my favorite, but maybe it will help during the day. Can take this with your gabapentin.   2) if your legs are not getting better in pool, I think sports medicine may really help. Also checking labs as well for a lot of things.   3) wrists.. checking b12 and thyroid and try your brace. It not better, let me know.   4) dizziness Tests are negative for vertigo, but really watch and keep journal if associated with head turning, not eating, etc.  Checking labs today.   Will look into plenity for you!  So good to see you! See you back in 6 months time!  Dr. Rogers Blocker

## 2020-09-26 NOTE — Progress Notes (Signed)
Patient: Stephanie Andrade MRN: [id number] DOB: 26-Feb-1956 PCP: Orland Mustard, MD     Subjective:  Chief Complaint  Patient presents with  . Depression  . Prediabetes  . myalgia  . tingling of hands    HPI: The patient is a 65 y.o. female who presents today for Depression, prediabetes, myalgia, tingling in both hands and 2 episodes of dizziness. She says that she is still doing good on Wellbutrin. It just depends on the severity of the situation. She has gained 14 pounds.   Hypertension: Here for follow up of hypertension.  Currently on cozaar 50mg /day. Takes medication as prescribed and denies any side effects. Exercise includes walking. Weight has been stable. Denies any chest pain, headaches, shortness of breath, vision changes, swelling in lower extremities.   Depression Currently on wellbutrin 150mg /day. She has good weeks and bad weeks. This is a particularly hard time as her husband would have been 80 and they had planned a trip. She has been crying and having emotional eating episodes recently. She has thought about increasing her wellbutrin, but isn't sure she really needs it right now.    Prediabetes Has been very well controlled over the last 2 years with weight loss.   myalgia She does have complaints of her whole body hurting. Her right thigh aches and has sharp pain in her actual thigh, not joint/hip. Both of her legs hurt all the way to her toes. She can't get any relief. She has tried the baclofen and it helps, but makes her sleepy Her hands also hurt her, but worse at night. During day it's a 2-3/10 and at night it's a 10+/10. She wonders if it's her posture. The sensation in her hands is tingling and then turns into pain. No joint pain. No swelling in her hands at all. She is able to use them fine.   Dizziness x 2 episodes She has had 2 episodes where she felt like the world was spinning and she was standing still. Only lasted a few seconds and went away. She denies  any palpitations, chest pain, tinnitus, infections, hearing loss. She did have spell when looking to right quickly.   Also has a spot on her left upper cheek. She is unsure how long it's been there and unsure if due to mask. Has not grown. Does not bleed. No hx of skin cancer.   Review of Systems  Constitutional: Negative for chills, fatigue and fever.  HENT: Negative for dental problem, ear pain, hearing loss and trouble swallowing.   Eyes: Negative for visual disturbance.  Respiratory: Negative for cough, chest tightness and shortness of breath.   Cardiovascular: Negative for chest pain, palpitations and leg swelling.  Gastrointestinal: Negative for abdominal pain, blood in stool, diarrhea and nausea.  Endocrine: Negative for cold intolerance, polydipsia, polyphagia and polyuria.  Genitourinary: Negative for dysuria and hematuria.  Musculoskeletal: Positive for myalgias. Negative for arthralgias.  Skin: Negative for rash.       Lesion on face   Neurological: Negative for dizziness, weakness, numbness and headaches.       Tingling in bilateral hands   Psychiatric/Behavioral: Positive for dysphoric mood. Negative for sleep disturbance. The patient is not nervous/anxious.     Allergies Patient is allergic to mushroom extract complex, agave, codeine, dextromethorphan, levaquin [levofloxacin in d5w], macrobid  [nitrofurantoin macrocrystal], tape, and tessalon perles.  Past Medical History Patient  has a past medical history of Anemia, Arthritis, Asthma, Depression, Family history of adverse reaction to anesthesia, Family history  of ovarian cancer, Fibroids, GERD (gastroesophageal reflux disease), History of bronchitis, History of sarcoidosis, Hypertension, Pneumonia, PONV (postoperative nausea and vomiting) (03/26/2016), and Wears glasses.  Surgical History Patient  has a past surgical history that includes Colonoscopy; Esophagogastroduodenoscopy; Total hip arthroplasty (Right, 03/24/2016);  Joint replacement; Cholecystectomy (N/A, 09/22/2016); Total hip arthroplasty (Left, 08/24/2017); and Total hip arthroplasty (Left, 08/24/2017).  Family History Pateint's family history includes Arthritis in her father and sister; Breast cancer in her maternal grandfather; Cancer in her mother; Hypertension in her brother and father.  Social History Patient  reports that she quit smoking about 11 years ago. Her smoking use included cigarettes. She has never used smokeless tobacco. She reports current alcohol use. She reports that she does not use drugs.    Objective: Vitals:   09/26/20 0906  BP: 118/60  Pulse: 90  Temp: 98.1 F (36.7 C)  TempSrc: Temporal  SpO2: 95%  Weight: 247 lb 6.4 oz (112.2 kg)  Height: 5\' 8"  (1.727 m)    Body mass index is 37.62 kg/m.  Physical Exam Vitals reviewed.  Constitutional:      Appearance: Normal appearance. She is well-developed and well-nourished. She is obese.  HENT:     Head: Normocephalic and atraumatic.     Right Ear: Tympanic membrane, ear canal and external ear normal.     Left Ear: Tympanic membrane, ear canal and external ear normal.     Mouth/Throat:     Mouth: Oropharynx is clear and moist. Mucous membranes are moist.  Eyes:     Extraocular Movements: Extraocular movements intact and EOM normal.     Conjunctiva/sclera: Conjunctivae normal.     Pupils: Pupils are equal, round, and reactive to light.  Neck:     Thyroid: No thyromegaly.     Vascular: No carotid bruit.  Cardiovascular:     Rate and Rhythm: Normal rate and regular rhythm.     Pulses: Normal pulses and intact distal pulses.     Heart sounds: Normal heart sounds. No murmur heard.   Pulmonary:     Effort: Pulmonary effort is normal.     Breath sounds: Normal breath sounds.  Abdominal:     General: Bowel sounds are normal. There is no distension.     Palpations: Abdomen is soft.     Tenderness: There is no abdominal tenderness.  Musculoskeletal:        General: No  swelling.     Cervical back: Normal range of motion and neck supple.     Comments: TTP over her bilateral TFL muscles. Strength intact.  Hands normal bilaterally with no joint deformity, swelling, redness. Grip strength and sensation intact.   Lymphadenopathy:     Cervical: No cervical adenopathy.  Skin:    General: Skin is warm and dry.     Capillary Refill: Capillary refill takes less than 2 seconds.     Findings: Lesion (left cheek. raised erythematous papule. mild flaking) present. No rash.  Neurological:     General: No focal deficit present.     Mental Status: She is alert and oriented to person, place, and time.     Cranial Nerves: No cranial nerve deficit.     Coordination: Coordination normal.     Deep Tendon Reflexes: Reflexes normal.     Comments: Negative dix-halpike bilaterally Negative tinel's and phalens   Psychiatric:        Mood and Affect: Mood and affect and mood normal.        Behavior: Behavior normal.  Comments: Tearful at times during appointment.       Flowsheet Row Office Visit from 09/26/2020 in [facility]  PHQ-9 Total Score 10     Procedure note Verbal consent obtained for cryotherapy to left cheek lesion. 2 rounds of light freeze applied. Patient tolerated procedure well with no issues. Wound care sheet given on care for cryotherapy. If not better she is to let me know.      Assessment/plan: 1. Primary hypertension Blood pressure is to goal. Continue current anti-hypertensive medications-losartan 50mg  daily.  Refills not given and routine lab work will be done today. Recommended routine exercise and healthy diet including DASH diet and mediterranean diet. Encouraged weight loss. F/u in 6-12 months.   - CBC with Differential/Platelet - Comprehensive metabolic panel  2. Current mild episode of major depressive disorder, unspecified whether recurrent (HCC) phq9 score is moderate and worse than last time I saw her. She is  having a hard week due to her deceased husband's would be 80th bday and a trip they had planned which she had to cancel. She is just struggling with missing him and life alone. She is more tearful. I discussed options with her and she isn't sure if she wants to increase her medication. She does have 75mg  pills at home of wellbutrin and discussed she would add this on to her 150 so it's not as big as a 300mg  dosage. She will think about it and let me know. If not doing well she is to contact me sooner.   3. Prediabetes  - Hemoglobin A1c  4. Myalgia Pain seems to be more in her TFL muscles bilaterally. She may have over extended herself by walking 21 miles in 7 days. I think PT would be beneficial, but she wants to wait on this and try some rehab in the pool. Will also check labs as well. Also discussed could be from her lower back as we know she has issues there as well.  - CK - ANA, IFA Comprehensive Panel  5. Tingling of both upper extremities ? If postural since worse at night when in bed. Checking labs and she is going to try her brace as this helped her in the past. She is to let me know if tingling continues.  - Vitamin B12 - TSH - ANA, IFA Comprehensive Panel  6. Dizziness Exam wnl. Has happened twice for a second. Asked her to keep a log and see if related to food, activity, bending over/squatting or head turning/posture. Checking labs and she will keep in touch.   7. AK Round of cryo therapy done today. If does not resolve discussed will need biopsy. Wound care given.    This visit occurred during the SARS-CoV-2 public health emergency.  Safety protocols were in place, including screening questions prior to the visit, additional usage of staff PPE, and extensive cleaning of exam room while observing appropriate contact time as indicated for disinfecting solutions.     Return in about 6 months (around 03/26/2021).   Orland Mustard, MD [facility]   09/26/2020

## 2020-09-30 ENCOUNTER — Encounter: Payer: Self-pay | Admitting: Family Medicine

## 2020-09-30 ENCOUNTER — Other Ambulatory Visit: Payer: Self-pay | Admitting: Family Medicine

## 2020-09-30 DIAGNOSIS — M2550 Pain in unspecified joint: Secondary | ICD-10-CM

## 2020-09-30 DIAGNOSIS — R768 Other specified abnormal immunological findings in serum: Secondary | ICD-10-CM

## 2020-09-30 DIAGNOSIS — R7689 Other specified abnormal immunological findings in serum: Secondary | ICD-10-CM | POA: Insufficient documentation

## 2020-09-30 LAB — ANA, IFA COMPREHENSIVE PANEL
Anti Nuclear Antibody (ANA): POSITIVE — AB
ENA SM Ab Ser-aCnc: <1 AI
SM/RNP: <1 AI
SSA (Ro) (ENA) Antibody, IgG: <1 AI
SSB (La) (ENA) Antibody, IgG: <1 AI
Scleroderma (Scl-70) (ENA) Antibody, IgG: <1 AI
ds DNA Ab: 1 IU/mL

## 2020-09-30 LAB — ANTI-NUCLEAR AB-TITER (ANA TITER): ANA Titer 1: 1:40 {titer} — ABNORMAL HIGH

## 2020-10-17 ENCOUNTER — Other Ambulatory Visit: Payer: Self-pay | Admitting: Family Medicine

## 2020-11-20 ENCOUNTER — Other Ambulatory Visit: Payer: Self-pay

## 2020-11-20 ENCOUNTER — Ambulatory Visit (INDEPENDENT_AMBULATORY_CARE_PROVIDER_SITE_OTHER): Payer: BC Managed Care – PPO | Admitting: Physician Assistant

## 2020-11-20 VITALS — BP 118/68 | HR 90 | Temp 98.2°F | Ht 68.0 in | Wt 248.2 lb

## 2020-11-20 DIAGNOSIS — L239 Allergic contact dermatitis, unspecified cause: Secondary | ICD-10-CM

## 2020-11-20 MED ORDER — METHYLPREDNISOLONE 4 MG PO TBPK
ORAL_TABLET | ORAL | 0 refills | Status: DC
Start: 2020-11-20 — End: 2021-01-08

## 2020-11-20 MED ORDER — TRIAMCINOLONE ACETONIDE 0.5 % EX OINT: 1.0000 "application " | TOPICAL_OINTMENT | Freq: Two times a day (BID) | CUTANEOUS | 0 refills | Status: AC

## 2020-11-20 NOTE — Patient Instructions (Signed)
Good to meet you today. This looks like an allergic contact dermatitis.  Please take the medrol dose pak and use a thin layer of the steroid cream as directed. Keep cool and hydrated. Benadryl prn. Daily Allegra, Claritin, or Zyrtec may also help.  Call back if worse or no improvement in the next 1-2 weeks.

## 2020-11-20 NOTE — Progress Notes (Signed)
Acute Office Visit  Subjective:    Patient ID: Stephanie Andrade, female    DOB: February 27, 1956, 65 y.o.   MRN: [id number]  Chief Complaint  Patient presents with  . Rash    HPI Patient is in today for pruritic rash x 2 weeks. Started while she was in [place]. Started as blisters on her arms and now red spots and spreading. She has tried Benadryl cream & OTC hydrocortisone cream w/o relief. Heat makes it worse. Has a dog and thinks she might have brought some fire ants in while in [place]. No family members with this rash.   No fever, chills, no recent sickness, no wheezing, and no other symptoms.   Past Medical History:  Diagnosis Date  . Anemia   . Arthritis   . Asthma   . Depression   . Family history of adverse reaction to anesthesia    sister, brother - PONV  . Family history of ovarian cancer   . Fibroids   . GERD (gastroesophageal reflux disease)    occ  . History of bronchitis   . History of sarcoidosis   . Hypertension   . Pneumonia    hx  . PONV (postoperative nausea and vomiting) 03/26/2016  . Wears glasses     Past Surgical History:  Procedure Laterality Date  . CHOLECYSTECTOMY N/A 09/22/2016   Procedure: LAPAROSCOPIC CHOLECYSTECTOMY WITH INTRAOPERATIVE CHOLANGIOGRAM;  Surgeon: Harriette Bouillon, MD;  Location: [facility] OR;  Service: General;  Laterality: N/A;  . COLONOSCOPY    . ESOPHAGOGASTRODUODENOSCOPY    . JOINT REPLACEMENT    . 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   Procedure: RIGHT TOTAL HIP ARTHROPLASTY ANTERIOR APPROACH;  Surgeon: Kathryne Hitch, MD;  Location: [facility] OR;  Service: Orthopedics;  Laterality: Right;  . TOTAL HIP ARTHROPLASTY Left 08/24/2017  . TOTAL HIP ARTHROPLASTY Left 08/24/2017   Procedure: LEFT TOTAL HIP ARTHROPLASTY ANTERIOR APPROACH;  Surgeon: Kathryne Hitch, MD;  Location: [facility] OR;  Service: Orthopedics;  Laterality: Left;    Family History  Problem Relation Age of Onset  . Cancer Mother   . Arthritis Father   .  Hypertension Father   . Arthritis Sister   . Hypertension Brother   . Breast cancer Maternal Grandfather     Social History   Socioeconomic History  . Marital status: Married    Spouse name: Not on file  . Number of children: Not on file  . Years of education: Not on file  . Highest education level: Not on file  Occupational History  . Not on file  Tobacco Use  . Smoking status: Former Smoker    Types: Cigarettes    Quit date: 06/20/2009    Years since quitting: 11.4  . Smokeless tobacco: Never Used  . Tobacco comment: "smoked 2 weeks out of the year for about 15 years"   Vaping Use  . Vaping Use: Never used  Substance and Sexual Activity  . Alcohol use: Yes    Comment: socially  . Drug use: No  . Sexual activity: Not on file  Other Topics Concern  . Not on file  Social History Narrative  . Not on file   Social Determinants of Health   Financial Resource Strain: Not on file  Food Insecurity: Not on file  Transportation Needs: Not on file  Physical Activity: Not on file  Stress: Not on file  Social Connections: Not on file  Intimate Partner Violence: Not on file    Outpatient Medications Prior to  Visit  Medication Sig Dispense Refill  . azelastine (ASTELIN) 0.1 % nasal spray Place 1 spray into both nostrils 2 (two) times daily. Use in each nostril as directed 30 mL 3  . buPROPion (WELLBUTRIN XL) 150 MG 24 hr tablet TAKE 1 TABLET EACH DAY. 90 tablet 1  . Cholecalciferol (VITAMIN D3) 10000 units TABS Take 10,000 Units by mouth daily.     . diclofenac sodium (VOLTAREN) 1 % GEL Apply 2 g topically 4 (four) times daily. 100 g 3  . docusate sodium (COLACE) 100 MG capsule Take 100 mg by mouth daily.    Marland Kitchen EPINEPHrine 0.3 mg/0.3 mL IJ SOAJ injection Inject 0.3 mLs (0.3 mg total) into the muscle as needed for anaphylaxis. 2 each 1  . gabapentin (NEURONTIN) 300 MG capsule Take 1 capsule (300 mg total) by mouth 3 (three) times daily as needed. 90 capsule 1  .  glucosamine-chondroitin 500-400 MG tablet Take 1 tablet by mouth 3 (three) times daily.    Marland Kitchen losartan (COZAAR) 50 MG tablet Take 1 tablet (50 mg total) by mouth daily. 90 tablet 3  . meloxicam (MOBIC) 15 MG tablet Take 15 mg by mouth daily.    . methocarbamol (ROBAXIN) 500 MG tablet Take 1 tablet (500 mg total) by mouth 3 (three) times daily. 90 tablet 1  . Multiple Vitamin (MULTI-VITAMIN PO) Take 1 tablet by mouth daily.    . mupirocin ointment (BACTROBAN) 2 % Place 1 application into the nose 2 (two) times daily. 22 g 0  . traMADol (ULTRAM) 50 MG tablet TAKE 1 TABLET EVERY TWELVE HOURS AS NEEDED. 45 tablet 0  . valACYclovir (VALTREX) 500 MG tablet Take 1 tablet (500 mg total) by mouth 2 (two) times daily. 10 tablet prn  . zolpidem (AMBIEN) 5 MG tablet Take 1 tablet (5 mg total) by mouth at bedtime as needed. for sleep 90 tablet 0   No facility-administered medications prior to visit.    Allergies  Allergen Reactions  . Mushroom Extract Complex Anaphylaxis and Hives  . Agave Nausea And Vomiting  . Codeine Nausea Only  . Dextromethorphan Nausea And Vomiting  . Levaquin [Levofloxacin In D5w] Other (See Comments)    Myalgias  . Macrobid  [Nitrofurantoin Macrocrystal] Nausea And Vomiting  . Tape Itching and Other (See Comments)    Please use paper tape  . Tessalon Perles Nausea And Vomiting    Review of Systems REFER TO HPI FOR PERTINENT POSITIVES AND NEGATIVES     Objective:    Physical Exam Vitals and nursing note reviewed.  Constitutional:      Appearance: Normal appearance.  HENT:     Head: Normocephalic.  Cardiovascular:     Rate and Rhythm: Normal rate.  Pulmonary:     Effort: Pulmonary effort is normal.  Skin:    Comments: Bilateral forearms she has several small scattered erythematous lesions with some papules and evidence of excoriation. No lesions on wrists or hands. None on lower extremities.   Neurological:     Mental Status: She is alert.     LMP 04/06/2011  (Exact Date)  Wt Readings from Last 3 Encounters:  09/26/20 247 lb 6.4 oz (112.2 kg)  03/20/20 233 lb 12.8 oz (106.1 kg)  06/27/19 250 lb (113.4 kg)    Health Maintenance Due  Topic Date Due  . COLONOSCOPY (Pts 45-16yrs Insurance coverage will need to be confirmed)  11/07/2017  . COVID-19 Vaccine (4 - Booster for Pfizer series) 10/28/2020    There are no  preventive care reminders to display for this patient.   Lab Results  Component Value Date   TSH 0.66 09/26/2020   Lab Results  Component Value Date   WBC 5.4 09/26/2020   HGB 12.7 09/26/2020   HCT 38.7 09/26/2020   MCV 80.0 09/26/2020   PLT 224.0 09/26/2020   Lab Results  Component Value Date   NA 141 09/26/2020   K 4.7 09/26/2020   CO2 30 09/26/2020   GLUCOSE 92 09/26/2020   BUN 24 (H) 09/26/2020   CREATININE 0.82 09/26/2020   BILITOT 0.5 09/26/2020   ALKPHOS 79 09/26/2020   AST 14 09/26/2020   ALT 12 09/26/2020   PROT 7.5 09/26/2020   ALBUMIN 4.3 09/26/2020   CALCIUM 9.5 09/26/2020   ANIONGAP 8 08/25/2017   GFR 76.02 09/26/2020   Lab Results  Component Value Date   CHOL 151 03/20/2020   Lab Results  Component Value Date   HDL 66 03/20/2020   Lab Results  Component Value Date   LDLCALC 68 03/20/2020   Lab Results  Component Value Date   TRIG 86 03/20/2020   Lab Results  Component Value Date   CHOLHDL 2.3 03/20/2020   Lab Results  Component Value Date   HGBA1C 5.7 09/26/2020       Assessment & Plan:   Problem List Items Addressed This Visit   None    1. Allergic contact dermatitis, unspecified trigger Rash x 2 weeks most c/w dermatitis, unsure of cause at this time, but says she did spend a lot of time in [place]. Will treat with medrol dose pak and triamcinolone cream as directed. See AVS for further instructions. She will f/up if worse or no improvement.  This visit occurred during the SARS-CoV-2 public health emergency.  Safety protocols were in place, including screening questions  prior to the visit, additional usage of staff PPE, and extensive cleaning of exam room while observing appropriate contact time as indicated for disinfecting solutions.   Dameer Speiser M Maryelizabeth Eberle, PA-C

## 2020-12-24 ENCOUNTER — Other Ambulatory Visit: Payer: Self-pay

## 2020-12-24 ENCOUNTER — Ambulatory Visit (INDEPENDENT_AMBULATORY_CARE_PROVIDER_SITE_OTHER): Payer: BC Managed Care – PPO | Admitting: Sports Medicine

## 2020-12-24 ENCOUNTER — Encounter: Payer: Self-pay | Admitting: Sports Medicine

## 2020-12-24 ENCOUNTER — Ambulatory Visit: Payer: Self-pay

## 2020-12-24 VITALS — BP 133/68 | Ht 68.0 in | Wt 248.0 lb

## 2020-12-24 DIAGNOSIS — M1711 Unilateral primary osteoarthritis, right knee: Secondary | ICD-10-CM

## 2020-12-24 DIAGNOSIS — M25561 Pain in right knee: Secondary | ICD-10-CM

## 2020-12-24 MED ORDER — HYDROCODONE-ACETAMINOPHEN 5-325 MG PO TABS: 1.0000 | ORAL_TABLET | Freq: Two times a day (BID) | ORAL | 0 refills | Status: DC | PRN

## 2020-12-24 MED ORDER — PREDNISONE 20 MG PO TABS: 20.0000 mg | ORAL_TABLET | Freq: Two times a day (BID) | ORAL | 0 refills | Status: DC

## 2020-12-24 NOTE — Patient Instructions (Signed)
Take no more than 2 tablets per day on days you have severe pain.   Take prednisone 20 mg twice a day for 7 days.   Compression, especially with long days of standing.  Ice will also be very helpful for swelling   Come back in 1-2 months for follow up

## 2020-12-24 NOTE — Progress Notes (Signed)
    SUBJECTIVE:   CHIEF COMPLAINT / HPI:   Posterior knee pain X 3 weeks.  Pain started at proximal RT posterior leg, -- pain is all the time. She also has distal upper leg pain posteriorly 3/4's of the time. Similar to pain previously with Baker cyst which ruptured.  Pain is described as sharp.  When she sits, she is unable to flex her right knee past 90 degrees. When she stands from sitting, she cannot bear weight on the right side. Once she is standing, she has pain in the posterior leg throughout, and has trouble bearing weight until this loosens up. No falls, trauma.  Sitting down causes the pain. Standing does not bother her No anterior knee pain.  Patient reports walking a lot with her dog, 10-12,000 steps a day.   PERTINENT  PMH / PSH: BMI 37, known primary arthritis of right knee   OBJECTIVE:   BP 133/68   Ht 5\' 8"  (1.727 m)   Wt 248 lb (112.5 kg)   LMP 04/06/2011 (Exact Date)   BMI 37.71 kg/m   Knee:  No gross abnormality. No ecchymoses, rash, erythema appreciated. Palpation of right knee with mild swelling and evidence of effusion.  TTP of posterior areas of knee. There is TTP to distal posterior femur just lateral of midline. She also has pain along the body of the lateral head of gastrocnemius. She also has TTP of lateral ankle, posterior area of lateral malleolus.  Passive ROM of right knee limited to 90 degrees flexion. She is able to extend fully bilaterally. No pain with resisted plantar/dosiflexion. Left knee still flexes to 125.  Right knee Ultrasound   Moderate fluid collection in suprapatellar pouch Noted in SAX and LAX. Medial joint line with severely decreased joint space and protrusion of mensiscus. Hypoechoic change with a meniscal pseudocyst. Lateral joint line with moderately decreased joint space. Spurring noted. Fibular head with bone spurs and overlying hypoechoic area likely representing small fluid collection  Common peroneal nerve difficult to  visualize.  Impression:  Changes of osteoarthritis with moderate to large effusion  Ultrasound and interpretation by DR. Maudie Mercury and McGuffey Fields, MD   ASSESSMENT/PLAN:   Right Knee arthritis  Patients generalized pain is likely due to worsening arthritis as evidenced by findings on ultrasound today. Will treat conservatively with compression and non-weight bearing exercises.  - vicodin PRN severe pain  - Prednisone burst 20 mg bid x 1 week - RICE - follow up in 1-2 months    Wilber Oliphant, MD [place]   I observed and examined the patient with the resident and agree with assessment and plan.  Note reviewed and modified by me.  I also discussed with patient that at some point consideration of TKR is reasonable.  Ila Mcgill, MD

## 2020-12-24 NOTE — Progress Notes (Deleted)
Posterior knee pain X 3 weeks.   Pain started at proximal posterior leg, which pain is all the time. She also has distal upper leg pain posteriorly 3/4's of the time. Similar to pain previously with Baker cyst which ruptured.  Pain is described as sharp.  When she sits, she is unable to flex her right knee past 90 degrees. When she stands from sitting, she cannot bear weight on the right side. Once she is standing, she has pain in the posterior leg throughout, and has trouble bearing weight.  No falls, trauma.   Sitting down causes the pain.  Standing does not bother her No anterior knee pain.  Patient reports walking a lot with her dog, 10-12,000 steps a day.   For the last few days, having lateral knee pain.

## 2020-12-25 NOTE — Assessment & Plan Note (Signed)
Given some hydrocodone for more severe pain Compression Should get an ice compression machine Consider TKR when ready

## 2020-12-27 DIAGNOSIS — H2513 Age-related nuclear cataract, bilateral: Secondary | ICD-10-CM | POA: Diagnosis not present

## 2020-12-27 DIAGNOSIS — H524 Presbyopia: Secondary | ICD-10-CM | POA: Diagnosis not present

## 2020-12-27 DIAGNOSIS — H52203 Unspecified astigmatism, bilateral: Secondary | ICD-10-CM | POA: Diagnosis not present

## 2020-12-27 DIAGNOSIS — H5203 Hypermetropia, bilateral: Secondary | ICD-10-CM | POA: Diagnosis not present

## 2021-01-06 ENCOUNTER — Encounter: Payer: Self-pay | Admitting: Family Medicine

## 2021-01-06 NOTE — Telephone Encounter (Signed)
Spoke with the pt to address concerns below. She was transferred to the front desk for scheduling.

## 2021-01-07 ENCOUNTER — Telehealth: Payer: BC Managed Care – PPO | Admitting: Family Medicine

## 2021-01-07 ENCOUNTER — Ambulatory Visit: Payer: BC Managed Care – PPO | Attending: Internal Medicine

## 2021-01-07 DIAGNOSIS — Z20822 Contact with and (suspected) exposure to covid-19: Secondary | ICD-10-CM

## 2021-01-08 ENCOUNTER — Telehealth (INDEPENDENT_AMBULATORY_CARE_PROVIDER_SITE_OTHER): Payer: BC Managed Care – PPO | Admitting: Family Medicine

## 2021-01-08 ENCOUNTER — Encounter: Payer: Self-pay | Admitting: Family Medicine

## 2021-01-08 VITALS — Ht 68.0 in | Wt 248.0 lb

## 2021-01-08 DIAGNOSIS — U071 COVID-19: Secondary | ICD-10-CM | POA: Diagnosis not present

## 2021-01-08 LAB — SARS-COV-2, NAA 2 DAY TAT

## 2021-01-08 LAB — NOVEL CORONAVIRUS, NAA: SARS-CoV-2, NAA: DETECTED — AB

## 2021-01-08 MED ORDER — PULMICORT FLEXHALER 180 MCG/ACT IN AEPB: 2.0000 | INHALATION_SPRAY | Freq: Two times a day (BID) | RESPIRATORY_TRACT | 0 refills | Status: DC | PRN

## 2021-01-08 NOTE — Patient Instructions (Addendum)
Suggested Scripting:  I have placed orders for you to receive Bebtelovimab.  Our scheduler will reach out to you either today or tomorrow.  At that time, they will give you directions to the [facility] location and instructions on where to park and how to notify staff of your arrival.   -make sure you take food with vitamins and split up throughout day  -inhaler sent in for you to use just in case. Only use if short of breath or for cough prn.   Hang in there and please let me know if you need anything! Dr. Rogers Blocker

## 2021-01-08 NOTE — Progress Notes (Signed)
Patient: Stephanie Andrade MRN: [id number] DOB: [birth year] PCP: Orland Mustard, MD     I connected with [name] on 01/08/21 at 8:24am by a video enabled telemedicine application and verified that I am speaking with the correct person using two identifiers.  Location patient: Home Location provider: [facility], Office Persons participating in this virtual visit: Anthonette Zivkovic and Dr. Artis Flock   I discussed the limitations of evaluation and management by telemedicine and the availability of in person appointments. The patient expressed understanding and agreed to proceed.   Subjective:  Chief Complaint  Patient presents with  . Covid Positive    Symptoms started on Sunday or before.   . Cough  . Headache  . Sore Throat  . Nasal Congestion    HPI: The patient is a 65 y.o. female who presents today for positive covid home test. She tested positive on Sunday.  Symptoms started on Saturday night. She states she had a wedding to do on Saturday and was taking home tests and was negative all the way through Saturday. On Sunday she had a faint pink line on her home test and then Monday her home test was positive. She did a PCR test yesterday. Symptoms on Saturday felt like a sinus/cold and fatigue.  She worked 14 hours so didn't think much of the fatigue. She has had a dry cough that is very mild and intermittent. She typically coughs if her mouth gets dry or due to drainage. She has had some headaches and her eyes hurt. She has had chills, but no fever. Highest recording to 99 degrees. Her lymph nodes feel swollen and ache at times as well on her neck. She denies any loss of taste/smell, she has no shortness of breath or wheezing. She is still walking her dog and feels more short of breath than usual, but is still able to walk her dog.   She has been covid vaccinated x 3.   Review of Systems  Constitutional: Positive for chills and fatigue. Negative for diaphoresis and fever.  HENT:  Positive for congestion and sore throat. Negative for sinus pressure and sinus pain.   Eyes: Positive for pain.  Respiratory: Positive for cough. Negative for chest tightness, shortness of breath and wheezing.   Gastrointestinal: Positive for diarrhea. Negative for abdominal pain, nausea and vomiting.  Musculoskeletal: Positive for arthralgias.  Neurological: Positive for headaches.    Allergies Patient is allergic to mushroom extract complex, agave, codeine, dextromethorphan, levaquin [levofloxacin in d5w], macrobid  [nitrofurantoin macrocrystal], tape, and tessalon perles.  Past Medical History Patient  has a past medical history of Anemia, Arthritis, Asthma, Depression, Family history of adverse reaction to anesthesia, Family history of ovarian cancer, Fibroids, GERD (gastroesophageal reflux disease), History of bronchitis, History of sarcoidosis, Hypertension, Pneumonia, PONV (postoperative nausea and vomiting) (03/26/2016), and Wears glasses.  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  Family History Pateint's family history includes Arthritis in her father and sister; Breast cancer in her maternal grandfather; Cancer in her mother; Hypertension in her brother and father.  Social History Patient  reports that she quit smoking about 11 years ago. Her smoking use included cigarettes. She has never used smokeless tobacco. She reports current alcohol use. She reports that she does not use drugs.    Objective: Vitals:   01/08/21 0807  Weight: 248  lb 0.3 oz (112.5 kg)  Height: 5\' 8"  (1.727 m)    Body mass index is 37.71 kg/m.  Physical Exam Vitals reviewed.  Constitutional:      Appearance: She is well-developed. She is obese.  HENT:      Head: Normocephalic and atraumatic.  Pulmonary:     Effort: Pulmonary effort is normal.     Comments: Comfortable and ease with talking. No work of breathing. No difficulty breathing.  Neurological:     Mental Status: She is alert.  Psychiatric:        Mood and Affect: Mood normal.        Behavior: Behavior normal.        Assessment/plan: 1. COVID-19 Mild covid. Day 5+ of illness  -starting her on treatment nutraceutical bundle including zinc sulfate, vit D, vit C, quercetin and melatonin 5-15+ days depending on symptoms. She has already started this.  -right at window for paxlovid, but she would feel more comfortable with mab infusion. If continues to improve, would cancel this as next infusion day is on Friday and she will be on day 7. mab ordered.  -ivermectin X5 days. Discussed with family that this is still considered experimental; however, multiple studies (over 71 at this point) have been done that have shown significant benefit with it's use and it's safety profile can not be beat. Side effects discussed. They would like to proceed.  -pulmicort prn if needed, but at this point really no pulmonary symptoms. She has very rare occasional cough when her throat feels dry.  -gargle mouthwash TID -outside as much as possible.  -pulse ox >93-94% -precautions given.    Return if symptoms worsen or fail to improve.    Orland Mustard, MD [facility]  01/08/2021

## 2021-01-09 ENCOUNTER — Other Ambulatory Visit: Payer: Self-pay | Admitting: Physician Assistant

## 2021-01-09 ENCOUNTER — Encounter: Payer: Self-pay | Admitting: Family Medicine

## 2021-01-09 ENCOUNTER — Telehealth: Payer: Self-pay

## 2021-01-09 ENCOUNTER — Other Ambulatory Visit (HOSPITAL_COMMUNITY): Payer: Self-pay

## 2021-01-09 MED ORDER — NIRMATRELVIR/RITONAVIR (PAXLOVID)TABLET
3.0000 | ORAL_TABLET | Freq: Two times a day (BID) | ORAL | 0 refills | Status: AC
  Filled 2021-01-09: qty 30, 5d supply, fill #0

## 2021-01-09 NOTE — Progress Notes (Signed)
Outpatient Oral COVID Treatment Note  I connected with Stephanie Andrade on 01/09/2021/11:04 AM by telephone and verified that I am speaking with the correct person using two identifiers.  I discussed the limitations, risks, security, and privacy concerns of performing an evaluation and management service by telephone and the availability of in person appointments. I also discussed with the patient that there may be a patient responsible charge related to this service. The patient expressed understanding and agreed to proceed.  Patient location: Home Provider location: Home  Diagnosis: COVID-19 infection  Purpose of visit: Discussion of potential use of Molnupiravir or Paxlovid, a new treatment for mild to moderate COVID-19 viral infection in non-hospitalized patients.   Subjective: Patient is a 65 y.o. female who has been diagnosed with COVID 19 viral infection.  Their symptoms began on 01/05/21 with Cough,headache,sinus symptoms,diarrhe,fatigue.  Past Medical History:  Diagnosis Date  . Anemia   . Arthritis   . Asthma   . Depression   . Family history of adverse reaction to anesthesia    sister, brother - PONV  . Family history of ovarian cancer   . Fibroids   . GERD (gastroesophageal reflux disease)    occ  . History of bronchitis   . History of sarcoidosis   . Hypertension   . Pneumonia    hx  . PONV (postoperative nausea and vomiting) 03/26/2016  . Wears glasses     Allergies  Allergen Reactions  . Mushroom Extract Complex Anaphylaxis and Hives  . Agave Nausea And Vomiting  . Codeine Nausea Only  . Dextromethorphan Nausea And Vomiting  . Levaquin [Levofloxacin In D5w] Other (See Comments)    Myalgias  . Macrobid  [Nitrofurantoin Macrocrystal] Nausea And Vomiting  . Tape Itching and Other (See Comments)    Please use paper tape  . Tessalon Perles Nausea And Vomiting     Current Outpatient Medications:  .  azelastine (ASTELIN) 0.1 % nasal spray, Place 1 spray into  both nostrils 2 (two) times daily. Use in each nostril as directed, Disp: 30 mL, Rfl: 3 .  budesonide (PULMICORT FLEXHALER) 180 MCG/ACT inhaler, Inhale 2 puffs into the lungs 2 (two) times daily as needed., Disp: 1 each, Rfl: 0 .  buPROPion (WELLBUTRIN XL) 150 MG 24 hr tablet, TAKE 1 TABLET EACH DAY., Disp: 90 tablet, Rfl: 1 .  Cholecalciferol (VITAMIN D3) 10000 units TABS, Take 10,000 Units by mouth daily. , Disp: , Rfl:  .  diclofenac sodium (VOLTAREN) 1 % GEL, Apply 2 g topically 4 (four) times daily., Disp: 100 g, Rfl: 3 .  docusate sodium (COLACE) 100 MG capsule, Take 100 mg by mouth daily., Disp: , Rfl:  .  EPINEPHrine 0.3 mg/0.3 mL IJ SOAJ injection, Inject 0.3 mLs (0.3 mg total) into the muscle as needed for anaphylaxis., Disp: 2 each, Rfl: 1 .  gabapentin (NEURONTIN) 300 MG capsule, Take 1 capsule (300 mg total) by mouth 3 (three) times daily as needed., Disp: 90 capsule, Rfl: 1 .  glucosamine-chondroitin 500-400 MG tablet, Take 1 tablet by mouth 3 (three) times daily., Disp: , Rfl:  .  HYDROcodone-acetaminophen (NORCO/VICODIN) 5-325 MG tablet, Take 1 tablet by mouth 2 (two) times daily as needed for moderate pain., Disp: 60 tablet, Rfl: 0 .  losartan (COZAAR) 50 MG tablet, Take 1 tablet (50 mg total) by mouth daily., Disp: 90 tablet, Rfl: 3 .  methocarbamol (ROBAXIN) 500 MG tablet, Take 1 tablet (500 mg total) by mouth 3 (three) times daily., Disp: 90 tablet,  Rfl: 1 .  Multiple Vitamin (MULTI-VITAMIN PO), Take 1 tablet by mouth daily. (Patient not taking: Reported on 01/08/2021), Disp: , Rfl:  .  mupirocin ointment (BACTROBAN) 2 %, Place 1 application into the nose 2 (two) times daily., Disp: 22 g, Rfl: 0 .  traMADol (ULTRAM) 50 MG tablet, TAKE 1 TABLET EVERY TWELVE HOURS AS NEEDED., Disp: 45 tablet, Rfl: 0 .  valACYclovir (VALTREX) 500 MG tablet, Take 1 tablet (500 mg total) by mouth 2 (two) times daily., Disp: 10 tablet, Rfl: prn .  zolpidem (AMBIEN) 5 MG tablet, Take 1 tablet (5 mg total)  by mouth at bedtime as needed. for sleep, Disp: 90 tablet, Rfl: 0  Objective: Patient appears/sounds sick.  They are in no apparent distress.  Breathing is non labored.  Mood and behavior are normal.  Laboratory Data:  Recent Results (from the past 2160 hour(s))  Novel Coronavirus, NAA ([facility])     Status: Abnormal   Collection Time: 01/07/21  5:08 PM   Specimen: Nasopharyngeal(NP) swabs in vial transport medium   Nasopharynge  Result Value Ref Range   SARS-CoV-2, NAA Detected (A) Not Detected    Comment: Patients who have a positive COVID-19 test result may now have treatment options. Treatment options are available for patients with mild to moderate symptoms and for hospitalized patients. Visit our website at [email or web address] for resources and information. This nucleic acid amplification test was developed and its performance characteristics determined by [employer]. Nucleic acid amplification tests include RT-PCR and TMA. This test has not been FDA cleared or approved. This test has been authorized by FDA under an Emergency Use Authorization (EUA). This test is only authorized for the duration of time the declaration that circumstances exist justifying the authorization of the emergency use of in vitro diagnostic tests for detection of SARS-CoV-2 virus and/or diagnosis of COVID-19 infection under section 564(b)(1) of the Act, 21 U.S.C. 102VOZ-3(G) (1), unless the authorization is terminated or revoked sooner. When diagnostic testing is negativ e, the possibility of a false negative result should be considered in the context of a patient's recent exposures and the presence of clinical signs and symptoms consistent with COVID-19. An individual without symptoms of COVID-19 and who is not shedding SARS-CoV-2 virus would expect to have a negative (not detected) result in this assay.   SARS-COV-2, NAA 2 DAY TAT     Status: None   Collection Time: 01/07/21   5:08 PM   Nasopharynge  Result Value Ref Range   SARS-CoV-2, NAA 2 DAY TAT Performed      Assessment: 65 y.o. female with mild/moderate COVID 19 viral infection diagnosed on 01/07/21 at high risk for progression to severe COVID 19.  Plan:  This patient is a 65 y.o. female that meets the following criteria for Emergency Use Authorization of: Paxlovid 1. Age >12 yr AND > 40 kg 2. SARS-COV-2 positive test 3. Symptom onset < 5 days 4. Mild-to-moderate COVID disease with high risk for severe progression to hospitalization or death  I have spoken and communicated the following to the patient or parent/caregiver regarding: 1. Paxlovid is an unapproved drug that is authorized for use under an Emergency Use Authorization.  2. There are no adequate, approved, available products for the treatment of COVID-19 in adults who have mild-to-moderate COVID-19 and are at high risk for progressing to severe COVID-19, including hospitalization or death. 3. Other therapeutics are currently authorized. For additional information on all products authorized for treatment or prevention of COVID-19, please see [email or web address].  4. There are benefits and risks of taking this treatment as outlined in the "Fact Sheet for Patients and Caregivers."  5. "Fact Sheet for Patients and Caregivers" was reviewed with patient. A hard copy will be provided to patient from pharmacy prior to the patient receiving treatment. 6. Patients should continue to self-isolate and use infection control measures (e.g., wear mask, isolate, social distance, avoid sharing personal items, clean and disinfect "high touch" surfaces, and frequent handwashing) according to CDC guidelines.  7. The patient or parent/caregiver has the option to accept or refuse treatment. 8. Patient medication history was reviewed for potential drug  interactions:Interaction with home meds: Wellbutrin XL (will hold) 9. Patient's GFR was calculated to be > 60 , and they were therefore prescribed Normal dose (GFR>60) - nirmatrelvir 150mg  tab (2 tablet) by mouth twice daily AND ritonavir 100mg  tab (1 tablet) by mouth twice daily   After reviewing above information with the patient, the patient agrees to receive Paxlovid.  Follow up instructions:    . Take prescription BID x 5 days as directed . Reach out to pharmacist for counseling on medication if desired . For concerns regarding further COVID symptoms please follow up with your PCP or urgent care . For urgent or life-threatening issues, seek care at your local emergency department  The patient was provided an opportunity to ask questions, and all were answered. The patient agreed with the plan and demonstrated an understanding of the instructions.   Script sent to [facility] and opted to pick up RX.  The patient was advised to call their PCP or seek an in-person evaluation if the symptoms worsen or if the condition fails to improve as anticipated.   I provided 10 minutes of non face-to-face telephone visit time during this encounter, and > 50% was spent counseling as documented under my assessment & plan.  [place], [place] 01/09/2021 /11:04 AM

## 2021-01-09 NOTE — Telephone Encounter (Signed)
Called to discuss with patient about COVID-19 symptoms and the use of one of the available treatments for those with mild to moderate Covid symptoms and at a high risk of hospitalization.  Pt appears to qualify for outpatient treatment due to co-morbid conditions and/or a member of an at-risk group in accordance with the FDA Emergency Use Authorization.    Symptom onset: 01/05/21 Cough,headache,sinus symptoms,diarrhe,fatigue Vaccinated: Yes Booster? Yes Immunocompromised? No Qualifiers: HTN,Obesity NIH Criteria: Tier 1  Pt. Would to speak with APP.  Stephanie Andrade

## 2021-01-10 ENCOUNTER — Other Ambulatory Visit: Payer: Self-pay | Admitting: Family Medicine

## 2021-01-10 MED ORDER — FLOVENT HFA 220 MCG/ACT IN AERO: 2.0000 | INHALATION_SPRAY | Freq: Two times a day (BID) | RESPIRATORY_TRACT | 0 refills | Status: DC

## 2021-01-10 NOTE — Progress Notes (Signed)
Called and discussed paxlovid. Symptoms started on Saturday night/day and she hasn't started paxlovid. Outside of 5 day window and advised not to take. (day 7).   -will send in flovent as I do not think they cover her pulmicort.   She sounds good. Oxygen doing well even with walking 94-98%.  Has my number if she needs anything,  Dr. Rogers Blocker

## 2021-01-16 ENCOUNTER — Ambulatory Visit: Payer: BC Managed Care – PPO | Attending: Critical Care Medicine

## 2021-01-16 DIAGNOSIS — Z20822 Contact with and (suspected) exposure to covid-19: Secondary | ICD-10-CM | POA: Diagnosis not present

## 2021-01-18 LAB — NOVEL CORONAVIRUS, NAA: SARS-CoV-2, NAA: NOT DETECTED

## 2021-01-18 LAB — SARS-COV-2, NAA 2 DAY TAT

## 2021-01-21 DIAGNOSIS — Z780 Asymptomatic menopausal state: Secondary | ICD-10-CM | POA: Diagnosis not present

## 2021-01-21 DIAGNOSIS — Z842 Family history of other diseases of the genitourinary system: Secondary | ICD-10-CM | POA: Diagnosis not present

## 2021-01-21 DIAGNOSIS — Z130 Encounter for screening for diseases of the blood and blood-forming organs and certain disorders involving the immune mechanism: Secondary | ICD-10-CM | POA: Diagnosis not present

## 2021-01-21 DIAGNOSIS — Z1231 Encounter for screening mammogram for malignant neoplasm of breast: Secondary | ICD-10-CM | POA: Diagnosis not present

## 2021-01-21 DIAGNOSIS — Z6837 Body mass index (BMI) 37.0-37.9, adult: Secondary | ICD-10-CM | POA: Diagnosis not present

## 2021-01-21 DIAGNOSIS — Z01419 Encounter for gynecological examination (general) (routine) without abnormal findings: Secondary | ICD-10-CM | POA: Diagnosis not present

## 2021-01-29 ENCOUNTER — Ambulatory Visit: Payer: BC Managed Care – PPO | Attending: Critical Care Medicine

## 2021-01-29 DIAGNOSIS — Z20822 Contact with and (suspected) exposure to covid-19: Secondary | ICD-10-CM | POA: Diagnosis not present

## 2021-01-30 LAB — SARS-COV-2, NAA 2 DAY TAT

## 2021-01-30 LAB — NOVEL CORONAVIRUS, NAA: SARS-CoV-2, NAA: NOT DETECTED

## 2021-02-21 ENCOUNTER — Other Ambulatory Visit: Payer: Self-pay

## 2021-02-21 ENCOUNTER — Encounter: Payer: Self-pay | Admitting: Physician Assistant

## 2021-02-21 ENCOUNTER — Ambulatory Visit (INDEPENDENT_AMBULATORY_CARE_PROVIDER_SITE_OTHER): Payer: BC Managed Care – PPO | Admitting: Physician Assistant

## 2021-02-21 VITALS — BP 100/59 | HR 85 | Temp 97.6°F | Ht 68.0 in | Wt 243.2 lb

## 2021-02-21 DIAGNOSIS — H6981 Other specified disorders of Eustachian tube, right ear: Secondary | ICD-10-CM | POA: Diagnosis not present

## 2021-02-21 MED ORDER — METHYLPREDNISOLONE 4 MG PO TBPK: ORAL_TABLET | ORAL | 0 refills | Status: DC

## 2021-02-21 NOTE — Patient Instructions (Signed)
Recheck prn

## 2021-02-21 NOTE — Progress Notes (Signed)
Acute Office Visit  Subjective:    Patient ID: Stephanie Andrade, female    DOB: 1956-01-10, 65 y.o.   MRN: [id number]  Chief Complaint  Patient presents with   Ear Fullness    Ear Fullness   Patient is in today for right ear fullness for the last month. Feels like it needs to pop. She has tried Cortisporin drops without relief. She has also tried Flonase and Mucinex. No pain. No discharge. No hearing loss or tinnitus.   Past Medical History:  Diagnosis Date   Anemia    Arthritis    Asthma    Depression    Family history of adverse reaction to anesthesia    sister, brother - PONV   Family history of ovarian cancer    Fibroids    GERD (gastroesophageal reflux disease)    occ   History of bronchitis    History of sarcoidosis    Hypertension    Pneumonia    hx   PONV (postoperative nausea and vomiting) 03/26/2016   Wears glasses     Past Surgical History:  Procedure Laterality Date   CHOLECYSTECTOMY N/A 09/22/2016   Procedure: LAPAROSCOPIC CHOLECYSTECTOMY WITH INTRAOPERATIVE CHOLANGIOGRAM;  Surgeon: Harriette Bouillon, MD;  Location: [facility] OR;  Service: General;  Laterality: N/A;   COLONOSCOPY     ESOPHAGOGASTRODUODENOSCOPY     JOINT REPLACEMENT     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   Procedure: RIGHT TOTAL HIP ARTHROPLASTY ANTERIOR APPROACH;  Surgeon: Kathryne Hitch, MD;  Location: [facility] OR;  Service: Orthopedics;  Laterality: Right;   TOTAL HIP ARTHROPLASTY Left 08/24/2017   TOTAL HIP ARTHROPLASTY Left 08/24/2017   Procedure: LEFT TOTAL HIP ARTHROPLASTY ANTERIOR APPROACH;  Surgeon: Kathryne Hitch, MD;  Location: [facility] OR;  Service: Orthopedics;  Laterality: Left;    Family History  Problem Relation Age of Onset   Cancer Mother    Arthritis Father    Hypertension Father    Arthritis Sister    Hypertension Brother    Breast cancer Maternal Grandfather     Social History   Socioeconomic History   Marital status: Married    Spouse name: Not on file    Number of children: Not on file   Years of education: Not on file   Highest education level: Not on file  Occupational History   Not on file  Tobacco Use   Smoking status: Former    Pack years: 0.00    Types: Cigarettes    Quit date: 06/20/2009    Years since quitting: 11.6   Smokeless tobacco: Never   Tobacco comments:    "smoked 2 weeks out of the year for about 15 years"   Vaping Use   Vaping Use: Never used  Substance and Sexual Activity   Alcohol use: Yes    Comment: socially   Drug use: No   Sexual activity: Not on file  Other Topics Concern   Not on file  Social History Narrative   Not on file   Social Determinants of Health   Financial Resource Strain: Not on file  Food Insecurity: Not on file  Transportation Needs: Not on file  Physical Activity: Not on file  Stress: Not on file  Social Connections: Not on file  Intimate Partner Violence: Not on file    Outpatient Medications Prior to Visit  Medication Sig Dispense Refill   azelastine (ASTELIN) 0.1 % nasal spray Place 1 spray into both nostrils 2 (two) times daily. Use in each  nostril as directed 30 mL 3   buPROPion (WELLBUTRIN XL) 150 MG 24 hr tablet TAKE 1 TABLET EACH DAY. 90 tablet 1   Cholecalciferol (VITAMIN D3) 10000 units TABS Take 10,000 Units by mouth daily.      diclofenac sodium (VOLTAREN) 1 % GEL Apply 2 g topically 4 (four) times daily. 100 g 3   docusate sodium (COLACE) 100 MG capsule Take 100 mg by mouth daily.     EPINEPHrine 0.3 mg/0.3 mL IJ SOAJ injection Inject 0.3 mLs (0.3 mg total) into the muscle as needed for anaphylaxis. 2 each 1   fluticasone (FLOVENT HFA) 220 MCG/ACT inhaler Inhale 2 puffs into the lungs in the morning and at bedtime. 1 each 0   gabapentin (NEURONTIN) 300 MG capsule Take 1 capsule (300 mg total) by mouth 3 (three) times daily as needed. 90 capsule 1   glucosamine-chondroitin 500-400 MG tablet Take 1 tablet by mouth 3 (three) times daily.     HYDROcodone-acetaminophen  (NORCO/VICODIN) 5-325 MG tablet Take 1 tablet by mouth 2 (two) times daily as needed for moderate pain. 60 tablet 0   losartan (COZAAR) 50 MG tablet Take 1 tablet (50 mg total) by mouth daily. 90 tablet 3   methocarbamol (ROBAXIN) 500 MG tablet Take 1 tablet (500 mg total) by mouth 3 (three) times daily. 90 tablet 1   Multiple Vitamin (MULTI-VITAMIN PO) Take 1 tablet by mouth daily.     mupirocin ointment (BACTROBAN) 2 % Place 1 application into the nose 2 (two) times daily. 22 g 0   traMADol (ULTRAM) 50 MG tablet TAKE 1 TABLET EVERY TWELVE HOURS AS NEEDED. 45 tablet 0   valACYclovir (VALTREX) 500 MG tablet Take 1 tablet (500 mg total) by mouth 2 (two) times daily. 10 tablet prn   zolpidem (AMBIEN) 5 MG tablet Take 1 tablet (5 mg total) by mouth at bedtime as needed. for sleep 90 tablet 0   budesonide (PULMICORT FLEXHALER) 180 MCG/ACT inhaler Inhale 2 puffs into the lungs 2 (two) times daily as needed. 1 each 0   No facility-administered medications prior to visit.    Allergies  Allergen Reactions   Mushroom Extract Complex Anaphylaxis and Hives   Agave Nausea And Vomiting   Codeine Nausea Only   Dextromethorphan Nausea And Vomiting   Levaquin [Levofloxacin In D5w] Other (See Comments)    Myalgias   Macrobid  [Nitrofurantoin Macrocrystal] Nausea And Vomiting   Tape Itching and Other (See Comments)    Please use paper tape   Tessalon Perles Nausea And Vomiting    Review of Systems REFER TO HPI FOR PERTINENT POSITIVES AND NEGATIVES     Objective:    Physical Exam Vitals and nursing note reviewed.  Constitutional:      Appearance: Normal appearance. She is obese.  HENT:     Right Ear: Hearing normal. No tenderness. A middle ear effusion is present. There is no impacted cerumen. Tympanic membrane is not erythematous or bulging.     Left Ear: Hearing normal. No tenderness.  No middle ear effusion. There is no impacted cerumen. Tympanic membrane is not erythematous or bulging.   Neurological:     Mental Status: She is alert.    BP (!) 100/59   Pulse 85   Temp 97.6 F (36.4 C)   Ht 5\' 8"  (1.727 m)   Wt 243 lb 4 oz (110.3 kg)   LMP 04/06/2011 (Exact Date)   SpO2 98%   BMI 36.99 kg/m  Wt Readings from Last  3 Encounters:  02/21/21 243 lb 4 oz (110.3 kg)  01/08/21 248 lb 0.3 oz (112.5 kg)  12/24/20 248 lb (112.5 kg)    Health Maintenance Due  Topic Date Due   Zoster Vaccines- Shingrix (1 of 2) Never done   COLONOSCOPY (Pts 45-50yrs Insurance coverage will need to be confirmed)  11/07/2017   Pneumococcal Vaccine 79-29 Years old (3 - PPSV23 or PCV20) 12/29/2018   COVID-19 Vaccine (4 - Booster for Pfizer series) 07/30/2020    There are no preventive care reminders to display for this patient.   Lab Results  Component Value Date   TSH 0.66 09/26/2020   Lab Results  Component Value Date   WBC 5.4 09/26/2020   HGB 12.7 09/26/2020   HCT 38.7 09/26/2020   MCV 80.0 09/26/2020   PLT 224.0 09/26/2020   Lab Results  Component Value Date   NA 141 09/26/2020   K 4.7 09/26/2020   CO2 30 09/26/2020   GLUCOSE 92 09/26/2020   BUN 24 (H) 09/26/2020   CREATININE 0.82 09/26/2020   BILITOT 0.5 09/26/2020   ALKPHOS 79 09/26/2020   AST 14 09/26/2020   ALT 12 09/26/2020   PROT 7.5 09/26/2020   ALBUMIN 4.3 09/26/2020   CALCIUM 9.5 09/26/2020   ANIONGAP 8 08/25/2017   GFR 76.02 09/26/2020   Lab Results  Component Value Date   CHOL 151 03/20/2020   Lab Results  Component Value Date   HDL 66 03/20/2020   Lab Results  Component Value Date   LDLCALC 68 03/20/2020   Lab Results  Component Value Date   TRIG 86 03/20/2020   Lab Results  Component Value Date   CHOLHDL 2.3 03/20/2020   Lab Results  Component Value Date   HGBA1C 5.7 09/26/2020       Assessment & Plan:   Problem List Items Addressed This Visit   None   1. Acute dysfunction of right eustachian tube Reassured no signs of infection, but she does have clear fluid on this ear,  indicating right ETD. Advised Flonase, Claritin, and medrol dose pak to take per packaging instructions. Heating pad, ibuprofen may also help. ENT if worse or no better.   Moustapha Tooker M Braeley Buskey, PA-C

## 2021-02-27 ENCOUNTER — Other Ambulatory Visit: Payer: Self-pay

## 2021-02-27 ENCOUNTER — Ambulatory Visit (INDEPENDENT_AMBULATORY_CARE_PROVIDER_SITE_OTHER): Payer: BC Managed Care – PPO | Admitting: Sports Medicine

## 2021-02-27 DIAGNOSIS — S76112A Strain of left quadriceps muscle, fascia and tendon, initial encounter: Secondary | ICD-10-CM

## 2021-02-27 DIAGNOSIS — S76112D Strain of left quadriceps muscle, fascia and tendon, subsequent encounter: Secondary | ICD-10-CM | POA: Insufficient documentation

## 2021-02-27 MED ORDER — NITROGLYCERIN 0.2 MG/HR TD PT24: MEDICATED_PATCH | TRANSDERMAL | 1 refills | Status: DC

## 2021-02-27 NOTE — Patient Instructions (Addendum)
For your knee: -Ice for 15 minutes at nighttime. -Start using 1/4 nitro patch daily. See protocol below. -Do the exercises we showed you today (straight leg raise and quad set) -Activity as tolerated but use pain as your guide. Limit the amount of knee bending you do. -Follow up in 3 weeks  Nitroglycerin Protocol  Apply 1/4 nitroglycerin patch to affected area daily. Change position of patch within the affected area every 24 hours. You may experience a headache during the first 1-2 weeks of using the patch, these should subside. If you experience headaches after beginning nitroglycerin patch treatment, you may take your preferred over the counter pain reliever. Another side effect of the nitroglycerin patch is skin irritation or rash related to patch adhesive. Please notify our office if you develop more severe headaches or rash, and stop the patch. Tendon healing with nitroglycerin patch may require 12 to 24 weeks depending on the extent of injury. Men should not use if taking Viagra, Cialis, or Levitra.  Do not use if you have migraines or rosacea.

## 2021-02-27 NOTE — Assessment & Plan Note (Signed)
NTG protocol Compression SLR and isometric quads Gradual increase of activity Lots of ice  Reck 3 weeks

## 2021-02-27 NOTE — Progress Notes (Signed)
   Stephanie Andrade is a 65 y.o. 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  Took 1 dose of prednisone 20mg  left over from prior script and began methylpred 4mg  (prescription is for her eustachian tubes) Also took 1 dose lasix with minimal improvement Has been using compression wrap, ice, and rest with some improvement  On Tuesday could not walk Since prednisone, ice and compression about to walk today 48 hrs later and has less pain. Worst pain still above patella laterally   PMH reviewed.   ROS as above. No locking or giving way. No other joint swelling. Painful but does not feel unstable  Medications reviewed.  Exam:  BP (!) 150/72   Ht 5\' 8"  (1.727 m)   Wt 242 lb (109.8 kg)   LMP 04/06/2011 (Exact Date)   BMI 36.80 kg/m  Gen: Well-appearing, NAD Obese MSK: Knee: - Inspection: no gross deformity. No swelling/effusion, erythema or bruising. Skin intact - Palpation: significantly TTP over the superior-lateral and inferior-medial knee. - ROM: pain with active and passive ROM, specifically full active ROM with knee extension . Mild TTP in the popliteal fossa. Decreased active ROM with knee flexion - feels tight at 95 deg.  - Strength: 4/5 strength of knee extension with resistance bilaterally/ worse on left - Neuro/vasc: NV intact bilaterally - Special Tests: - LIGAMENTS: negative anterior and posterior drawer, negative Lachman's, no MCL or LCL laxity  -- MENISCUS: negative McMurray's -- PF JOINT: nml patellar mobility bilaterally.  negative patellar grind, negative patellar apprehension  POCUS No knee effusion in the SPP Medial and lateral joint line with spurring/ loss of space Lateral meniscus bulges out of joint line Patellar tendon  intact  There is a moderately large effusion along the distal vastus lateralis with retraction of fibers  Impression: moderately high grade vastus lateralis tendon tear.  Ultrasound and interpretation by Wolfgang Phoenix. Fields, MD and Dr. Oleh Genin    No results found.   Assessment and Plan: 1) Left knee pain  Partial tear of vastus lateralis.  Ultrasound findings consistent with partial quadriceps tear (specifically vastus lateralis), likely in the setting of excessive fatigue of the muscle due to increasing amount of time standing as well as increased activity with up-and-down movements.  Arthritis with spurring have medial and lateral joint lines possibly contributing to pain.  Patient instructed to ice the area, isometric quad exercises, and activity as tolerated.  Compression above the knee. Starting nitro patches, protocol given to patient.  Follow-up in 3 weeks for repeat scan.  Quamesha Mullet, DO   I observed and examined the patient with the resident and agree with assessment and plan.  Note reviewed and modified by me. Ila Mcgill, MD

## 2021-03-18 ENCOUNTER — Encounter: Payer: Self-pay | Admitting: Family Medicine

## 2021-03-18 DIAGNOSIS — Z1211 Encounter for screening for malignant neoplasm of colon: Secondary | ICD-10-CM | POA: Diagnosis not present

## 2021-03-20 ENCOUNTER — Ambulatory Visit (INDEPENDENT_AMBULATORY_CARE_PROVIDER_SITE_OTHER): Payer: BC Managed Care – PPO | Admitting: Sports Medicine

## 2021-03-20 ENCOUNTER — Other Ambulatory Visit: Payer: Self-pay

## 2021-03-20 DIAGNOSIS — S76112D Strain of left quadriceps muscle, fascia and tendon, subsequent encounter: Secondary | ICD-10-CM

## 2021-03-20 DIAGNOSIS — S86012A Strain of left Achilles tendon, initial encounter: Secondary | ICD-10-CM | POA: Insufficient documentation

## 2021-03-20 DIAGNOSIS — S76112A Strain of left quadriceps muscle, fascia and tendon, initial encounter: Secondary | ICD-10-CM | POA: Diagnosis not present

## 2021-03-20 NOTE — Assessment & Plan Note (Signed)
Ultrasound of the left vastus lateralis There is some hypoechoic swelling along the vastus lateralis tendon of the left knee This now extends only 3 cm Most tendon fibers are intact and only a few are retracted  Impression: Improvement in acute vastus lateralis tendon tear  Right vastus lateralis scan There is an area with some calcification deep in the more proximal vastus lateralis This seems to be at the fascial layer separating the vastus lateralis and rectus femoris No sign of a tear or other abnormality  Impression remote strain of right vastus lateralis  Ultrasound and interpretation by Dr. Ky Barban and Dr. Oneida Alar  We will continue with quadriceps exercises; Periodic icing Try nitroglycerin protocol on the vastus lateralis both left and right If not improving consider ESWT

## 2021-03-20 NOTE — Progress Notes (Signed)
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  Physical exam Pleasant obese female in no acute distress BP (!) 131/52   Ht '5\' 8"'$  (1.727 m)   Wt 242 lb (109.8 kg)   LMP 04/06/2011 (Exact Date)   BMI 36.80 kg/m   Left knee shows full range of motion There is some slight swelling just above the patella laterally This is much reduced from before and is nontender to palpation  At the insertion of the left Achilles and slightly medially there is some swelling and tenderness over what feels like a Haglund's deformity  Over the right upper thigh there is point tenderness deep in the upper vastus lateralis  Both hips show good range of motion and no pain with motion of either hip

## 2021-03-20 NOTE — Assessment & Plan Note (Signed)
This should improve since she is no longer limping Use ice Heel lifts added to both shoes

## 2021-03-21 ENCOUNTER — Encounter: Payer: BC Managed Care – PPO | Admitting: Family Medicine

## 2021-03-26 LAB — COLOGUARD
COLOGUARD: NEGATIVE
Cologuard: NEGATIVE

## 2021-03-26 LAB — EXTERNAL GENERIC LAB PROCEDURE: COLOGUARD: NEGATIVE

## 2021-04-07 ENCOUNTER — Ambulatory Visit: Payer: BC Managed Care – PPO | Admitting: Family Medicine

## 2021-04-17 ENCOUNTER — Ambulatory Visit (INDEPENDENT_AMBULATORY_CARE_PROVIDER_SITE_OTHER): Payer: BC Managed Care – PPO | Admitting: Sports Medicine

## 2021-04-17 ENCOUNTER — Other Ambulatory Visit: Payer: Self-pay

## 2021-04-17 VITALS — Ht 68.0 in | Wt 240.0 lb

## 2021-04-17 DIAGNOSIS — S76111D Strain of right quadriceps muscle, fascia and tendon, subsequent encounter: Secondary | ICD-10-CM | POA: Diagnosis not present

## 2021-04-17 DIAGNOSIS — S86012D Strain of left Achilles tendon, subsequent encounter: Secondary | ICD-10-CM

## 2021-04-17 DIAGNOSIS — S76112D Strain of left quadriceps muscle, fascia and tendon, subsequent encounter: Secondary | ICD-10-CM | POA: Diagnosis not present

## 2021-04-17 MED ORDER — GABAPENTIN 300 MG PO CAPS: 300.0000 mg | ORAL_CAPSULE | Freq: Every day | ORAL | 1 refills | Status: DC

## 2021-04-17 NOTE — Assessment & Plan Note (Addendum)
Improving clinically and on [place].  Continue nitro patches.  F/U 6 weeks to ensure continued improvement.

## 2021-04-17 NOTE — Progress Notes (Signed)
Stephanie Andrade is a 65 y.o. female who presents to [facility] today for the following:  Left vastus lateralis tear f/u Right vastus laterals strain f/u Last seen on 8/4 for the same Has a prior history of vastus lateralis tendon tear on left, was improving on ultrasound at last visit Also noted to have a remote strain of right vastus lateralis at that time She has been doing nitroglycerin protocol bilaterally, tolerating well Overall having improvement in her pain  Left is 40% improved, right is at least 50% improved She reports that after her right hip arthoplasty she was having tightness and pain in her leg that was related to neurogenic complications, she sometimes feels this is similar and feels some cramping  Left Heel Pain f/u Left heel pain was discussed at last visit and heel lifts were added to shoes Patient reports that she has not had any improvement with this and that the pain has worsened since her last visit She reports that the pain is mostly in the posterior aspect of her heel on her Achilles tendon and just slightly anterior to it on the medial and lateral aspect States that it does improve throughout the day, feels that the worst pain is with the first stretch in the morning when she takes a step forward Stairs worsens her pain She states that she did try nitro patches for 1 week but did not have much improvement with this   PMH reviewed.  ROS as above. Medications reviewed.  Exam:  Ht '5\' 8"'$  (1.727 m)   Wt 240 lb (108.9 kg)   LMP 04/06/2011 (Exact Date)   BMI 36.49 kg/m  Gen: Well NAD MSK:  Bilateral Upper Legs: - Inspection: no gross deformity b/l. No swelling/effusion, erythema or bruising b/l. Skin intact - Palpation: no TTP b/l - ROM: full active ROM with flexion and extension in knee and hip b/l - Strength: 5/5 strength b/l, pain with resisted hip flexion on right - Neuro/vasc: NV intact distally b/l   Left Ankle: - Inspection: No obvious deformity,  erythema, swelling, or ecchymosis, ulcers, calluses, blisters b/l - Palpation: TTP in region of retrocalcaneal bursa on left, none over achilles, no deformity of achilles tendon - Strength: Normal strength with dorsiflexion, plantarflexion, inversion, and eversion of foot; flexion and extension of toes b/l - ROM: Full ROM b/l - Neuro/vasc: NV intact distally bilaterally   Limited Left Ankle [place] Insertion of left achilles with inflammed retrocalcaneal bursa, negative doppler flow, 0.75cm in size Right achilles insertion for comparison is 0.59 cm Impression: Left Achilles tendinitis with retrocalcaneal bursa inflammation  [place] of left vastus lateralis Improving hypoechoic swelling along vastus lateralis tendon of left knee with intact fibers throughout the vastus lateralis Impression: Improving acute vastus lateralis tendon tear  Ultrasound of right vastus lateralis No significant sign of tear or abnormality, there is some calcification noted deep in the more proximal vastus lateralis Impression: Improving remote strain of right vastus lateralis  Ultrasound and interpretation by Wolfgang Phoenix. Fields, MD and [place] Constable, DO    No results found.   Assessment and Plan: 1) Quadriceps tendon rupture, left, subsequent encounter Improving clinically and on [place].  Continue nitro patches.  F/U 6 weeks to ensure continued improvement.  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  Can continue nitro patches, for strain.  Will have her start gabapentin '300mg'$  QHS and f/u in 6 weeks.  Strain of left Achilles tendon Scan c/w achilles  tendon enlargement, but without neovascularization, retrocalcaneal bursa inflammation noted.  Will continue heel lifts, ice, nitro patches on the heel and start heel raises.  F/U 6 weeks and will repeat scan at that time.   [place] Constable, D.O.  PGY-4 [facility]  04/17/2021 5:39 PM  I observed and examined the patient with the [facility] resident and agree with assessment and plan.  Note reviewed and modified by me. Ila Mcgill, MD

## 2021-04-17 NOTE — Assessment & Plan Note (Signed)
Scan c/w achilles tendon enlargement, but without neovascularization, retrocalcaneal bursa inflammation noted.  Will continue heel lifts, ice, nitro patches on the heel and start heel raises.  F/U 6 weeks and will repeat scan at that time.

## 2021-04-17 NOTE — Assessment & Plan Note (Signed)
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  Can continue nitro patches, for strain.  Will have her start gabapentin '300mg'$  QHS and f/u in 6 weeks.

## 2021-05-08 ENCOUNTER — Encounter: Payer: BC Managed Care – PPO | Admitting: Physician Assistant

## 2021-06-04 ENCOUNTER — Other Ambulatory Visit: Payer: Self-pay | Admitting: Sports Medicine

## 2021-06-04 ENCOUNTER — Telehealth: Payer: Self-pay | Admitting: Family Medicine

## 2021-06-09 ENCOUNTER — Other Ambulatory Visit: Payer: Self-pay | Admitting: Sports Medicine

## 2021-06-09 NOTE — Telephone Encounter (Signed)
Patient has appt with Dr. Sharlet Salina to transfer on 09/30/21.  Patient is requesting refills.  Please advise if appt needs to be scheduled.   Patient can be reached at [phone].

## 2021-06-09 NOTE — Telephone Encounter (Signed)
Pt missed HTN follow up in Aug. Please schedule with any Provider for appointment.   Thank You

## 2021-06-10 NOTE — Telephone Encounter (Signed)
LVM asking patient to call back and scheduled.

## 2021-06-11 ENCOUNTER — Other Ambulatory Visit: Payer: Self-pay

## 2021-06-11 ENCOUNTER — Ambulatory Visit (INDEPENDENT_AMBULATORY_CARE_PROVIDER_SITE_OTHER): Payer: BC Managed Care – PPO | Admitting: Physician Assistant

## 2021-06-11 ENCOUNTER — Encounter: Payer: Self-pay | Admitting: Physician Assistant

## 2021-06-11 VITALS — BP 122/76 | HR 74 | Temp 97.0°F | Ht 68.0 in | Wt 252.2 lb

## 2021-06-11 DIAGNOSIS — F320 Major depressive disorder, single episode, mild: Secondary | ICD-10-CM

## 2021-06-11 DIAGNOSIS — B001 Herpesviral vesicular dermatitis: Secondary | ICD-10-CM | POA: Diagnosis not present

## 2021-06-11 DIAGNOSIS — G479 Sleep disorder, unspecified: Secondary | ICD-10-CM | POA: Diagnosis not present

## 2021-06-11 DIAGNOSIS — I10 Essential (primary) hypertension: Secondary | ICD-10-CM | POA: Diagnosis not present

## 2021-06-11 MED ORDER — LOSARTAN POTASSIUM 50 MG PO TABS: 50.0000 mg | ORAL_TABLET | Freq: Every day | ORAL | 1 refills | Status: DC

## 2021-06-11 MED ORDER — VALACYCLOVIR HCL 500 MG PO TABS: 500.0000 mg | ORAL_TABLET | Freq: Every day | ORAL | 0 refills | Status: AC

## 2021-06-11 MED ORDER — BUPROPION HCL ER (XL) 150 MG PO TB24: ORAL_TABLET | ORAL | 1 refills | Status: DC

## 2021-06-11 MED ORDER — ZOLPIDEM TARTRATE 5 MG PO TABS: 5.0000 mg | ORAL_TABLET | Freq: Every evening | ORAL | 0 refills | Status: AC | PRN

## 2021-06-11 NOTE — Progress Notes (Signed)
Subjective:    Patient ID: Stephanie Andrade, female    DOB: 02/06/1956, 65 y.o.   MRN: [id number]  No chief complaint on file.   HPI Patient is in today for medication refills for chronic issues. She has no acute concerns today. She will be establishing with new PCP in the new year.   See A/P.   Past Medical History:  Diagnosis Date   Anemia    Arthritis    Asthma    Depression    Family history of adverse reaction to anesthesia    sister, brother - PONV   Family history of ovarian cancer    Fibroids    GERD (gastroesophageal reflux disease)    occ   History of bronchitis    History of sarcoidosis    Hypertension    Pneumonia    hx   PONV (postoperative nausea and vomiting) 03/26/2016   Wears glasses     Past Surgical History:  Procedure Laterality Date   CHOLECYSTECTOMY N/A 09/22/2016   Procedure: LAPAROSCOPIC CHOLECYSTECTOMY WITH INTRAOPERATIVE CHOLANGIOGRAM;  Surgeon: Harriette Bouillon, MD;  Location: [facility] OR;  Service: General;  Laterality: N/A;   COLONOSCOPY     ESOPHAGOGASTRODUODENOSCOPY     JOINT REPLACEMENT     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   Procedure: RIGHT TOTAL HIP ARTHROPLASTY ANTERIOR APPROACH;  Surgeon: Kathryne Hitch, MD;  Location: [facility] OR;  Service: Orthopedics;  Laterality: Right;   TOTAL HIP ARTHROPLASTY Left 08/24/2017   TOTAL HIP ARTHROPLASTY Left 08/24/2017   Procedure: LEFT TOTAL HIP ARTHROPLASTY ANTERIOR APPROACH;  Surgeon: Kathryne Hitch, MD;  Location: [facility] OR;  Service: Orthopedics;  Laterality: Left;    Family History  Problem Relation Age of Onset   Cancer Mother    Arthritis Father    Hypertension Father    Arthritis Sister    Hypertension Brother    Breast cancer Maternal Grandfather     Social History   Tobacco Use   Smoking status: Former    Types: Cigarettes    Quit date: 06/20/2009    Years since quitting: 11.9   Smokeless tobacco: Never   Tobacco comments:    "smoked 2 weeks out of the year for about  15 years"   Vaping Use   Vaping Use: Never used  Substance Use Topics   Alcohol use: Yes    Comment: socially   Drug use: No     Allergies  Allergen Reactions   Mushroom Extract Complex Anaphylaxis and Hives   Agave Nausea And Vomiting   Codeine Nausea Only   Dextromethorphan Nausea And Vomiting   Levaquin [Levofloxacin In D5w] Other (See Comments)    Myalgias   Macrobid  [Nitrofurantoin Macrocrystal] Nausea And Vomiting   Tape Itching and Other (See Comments)    Please use paper tape   Tessalon Perles Nausea And Vomiting    Review of Systems REFER TO HPI FOR PERTINENT POSITIVES AND NEGATIVES      Objective:     BP 122/76   Pulse 74   Temp (!) 97 F (36.1 C)   Ht 5\' 8"  (1.727 m)   Wt 252 lb 3.2 oz (114.4 kg)   LMP 04/06/2011 (Exact Date)   SpO2 99%   BMI 38.35 kg/m   Wt Readings from Last 3 Encounters:  06/11/21 252 lb 3.2 oz (114.4 kg)  04/17/21 240 lb (108.9 kg)  03/20/21 242 lb (109.8 kg)    BP Readings from Last 3 Encounters:  06/11/21 122/76  03/20/21 (!) 131/52  02/27/21 (!) 150/72     Physical Exam Vitals and nursing note reviewed.  Constitutional:      Appearance: Normal appearance. She is normal weight. She is not toxic-appearing.  HENT:     Head: Normocephalic and atraumatic.     Right Ear: External ear normal.     Left Ear: External ear normal.     Nose: Nose normal.     Mouth/Throat:     Mouth: Mucous membranes are moist.  Eyes:     Extraocular Movements: Extraocular movements intact.     Conjunctiva/sclera: Conjunctivae normal.     Pupils: Pupils are equal, round, and reactive to light.  Cardiovascular:     Rate and Rhythm: Normal rate and regular rhythm.     Pulses: Normal pulses.     Heart sounds: Normal heart sounds.  Pulmonary:     Effort: Pulmonary effort is normal.     Breath sounds: Normal breath sounds.  Abdominal:     General: Abdomen is flat. Bowel sounds are normal.     Palpations: Abdomen is soft.   Musculoskeletal:        General: Normal range of motion.     Cervical back: Normal range of motion and neck supple.  Skin:    General: Skin is warm and dry.  Neurological:     General: No focal deficit present.     Mental Status: She is alert and oriented to person, place, and time.  Psychiatric:        Mood and Affect: Mood normal.        Behavior: Behavior normal.        Thought Content: Thought content normal.        Judgment: Judgment normal.       Assessment & Plan:   Problem List Items Addressed This Visit       Cardiovascular and Mediastinum   Hypertension - Primary   Relevant Medications   losartan (COZAAR) 50 MG tablet     Other   Depression   Relevant Medications   buPROPion (WELLBUTRIN XL) 150 MG 24 hr tablet   Other Visit Diagnoses     Primary sleep disturbance       Recurrent cold sores       Relevant Medications   valACYclovir (VALTREX) 500 MG tablet        Meds ordered this encounter  Medications   buPROPion (WELLBUTRIN XL) 150 MG 24 hr tablet    Sig: TAKE 1 TABLET EACH DAY.    Dispense:  90 tablet    Refill:  1   losartan (COZAAR) 50 MG tablet    Sig: Take 1 tablet (50 mg total) by mouth daily.    Dispense:  90 tablet    Refill:  1   valACYclovir (VALTREX) 500 MG tablet    Sig: Take 1 tablet (500 mg total) by mouth daily.    Dispense:  30 tablet    Refill:  0   zolpidem (AMBIEN) 5 MG tablet    Sig: Take 1 tablet (5 mg total) by mouth at bedtime as needed. for sleep    Dispense:  90 tablet    Refill:  0    1. Primary hypertension Blood pressure is to goal.  She is taking losartan 50 mg daily.  This medication was refilled.  Encouraged DASH diet and plenty of exercise.  2. Current mild episode of major depressive disorder, unspecified whether recurrent (HCC) Doing well.  Stable with Wellbutrin  XL 150 mg once daily.  Patient states that she tried to go to twice daily at one point, by did not feel right with a higher dose.  She has been  doing well with this regimen for a while now.  Medication refilled today.  3. Primary sleep disturbance She takes Ambien 5 mg at bedtime as needed. PDMP reviewed today, no red flags, filling appropriately.   4. Recurrent cold sores Also discussed that patient has a history of recurrent cold sores and she states that she gets wound usually every 1 to 2 months.  She was taking Valtrex only as needed and I encouraged her to try to start taking it once daily for prevention as she is getting them so often.  Refilled this medication today and she will let me know if this is helping to suppress her recurrent cold sores.   This note was prepared with assistance of Conservation officer, historic buildings. Occasional wrong-word or sound-a-like substitutions may have occurred due to the inherent limitations of voice recognition software.  Time Spent: 30 minutes of total time was spent on the date of the encounter performing the following actions: chart review prior to seeing the patient, obtaining history, performing a medically necessary exam, counseling on the treatment plan, placing orders, and documenting in our EHR.    Moiz Ryant M Simran Mannis, PA-C

## 2021-06-13 DIAGNOSIS — R350 Frequency of micturition: Secondary | ICD-10-CM | POA: Diagnosis not present

## 2021-09-30 ENCOUNTER — Other Ambulatory Visit: Payer: Self-pay

## 2021-09-30 ENCOUNTER — Ambulatory Visit (INDEPENDENT_AMBULATORY_CARE_PROVIDER_SITE_OTHER): Payer: BC Managed Care – PPO | Admitting: Internal Medicine

## 2021-09-30 ENCOUNTER — Encounter: Payer: Self-pay | Admitting: Internal Medicine

## 2021-09-30 VITALS — BP 124/68 | HR 86 | Resp 18 | Ht 68.0 in | Wt 253.2 lb

## 2021-09-30 DIAGNOSIS — I10 Essential (primary) hypertension: Secondary | ICD-10-CM

## 2021-09-30 DIAGNOSIS — R7303 Prediabetes: Secondary | ICD-10-CM | POA: Diagnosis not present

## 2021-09-30 DIAGNOSIS — F330 Major depressive disorder, recurrent, mild: Secondary | ICD-10-CM

## 2021-09-30 DIAGNOSIS — Z0001 Encounter for general adult medical examination with abnormal findings: Secondary | ICD-10-CM

## 2021-09-30 DIAGNOSIS — Z0000 Encounter for general adult medical examination without abnormal findings: Secondary | ICD-10-CM

## 2021-09-30 DIAGNOSIS — Z23 Encounter for immunization: Secondary | ICD-10-CM

## 2021-09-30 MED ORDER — BUPROPION HCL ER (XL) 150 MG PO TB24: 300.0000 mg | ORAL_TABLET | Freq: Every day | ORAL | 3 refills | Status: DC

## 2021-09-30 NOTE — Patient Instructions (Addendum)
We are checking the labs today.  We have given you the shingles vaccine today. Think about the covid-19 booster.

## 2021-09-30 NOTE — Progress Notes (Signed)
Subjective:   Patient ID: Stephanie Andrade, female    DOB: [birth year], 66 y.o.   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   PMH, FMH, social history reviewed and updated  Review of Systems  Constitutional: Negative.   HENT: Negative.    Eyes: Negative.   Respiratory:  Negative for cough, chest tightness and shortness of breath.   Cardiovascular:  Negative for chest pain, palpitations and leg swelling.  Gastrointestinal:  Negative for abdominal distention, abdominal pain, constipation, diarrhea, nausea and vomiting.  Musculoskeletal: Negative.   Skin: Negative.   Neurological: Negative.   Psychiatric/Behavioral: Negative.     Objective:  Physical Exam Constitutional:      Appearance: She is well-developed.  HENT:     Head: Normocephalic and atraumatic.  Cardiovascular:     Rate and Rhythm: Normal rate and regular rhythm.  Pulmonary:     Effort: Pulmonary effort is normal. No respiratory distress.     Breath sounds: Normal breath sounds. No wheezing or rales.  Abdominal:     General: Bowel sounds are normal. There is no distension.     Palpations: Abdomen is soft.     Tenderness: There is no abdominal tenderness. There is no rebound.  Musculoskeletal:     Cervical back: Normal range of motion.  Skin:    General: Skin is warm and dry.  Neurological:     Mental Status: She is alert and oriented to person, place, and time.     Coordination: Coordination normal.    Vitals:   09/30/21 1324  BP: 124/68  Pulse: 86  Resp: 18  SpO2: 98%  Weight: 253 lb 3.2 oz (114.9 kg)  Height: 5\' 8"  (1.727 m)    This visit occurred during the SARS-CoV-2 public health emergency.  Safety protocols were in place, including screening questions prior to the visit, additional usage of staff PPE, and extensive cleaning of exam room while observing appropriate contact time as indicated for disinfecting solutions.   Assessment &  Plan:  Shingrix IM given at visit

## 2021-10-03 DIAGNOSIS — Z0000 Encounter for general adult medical examination without abnormal findings: Secondary | ICD-10-CM | POA: Insufficient documentation

## 2021-10-03 DIAGNOSIS — Z0001 Encounter for general adult medical examination with abnormal findings: Secondary | ICD-10-CM | POA: Insufficient documentation

## 2021-10-03 MED ORDER — LOSARTAN POTASSIUM 50 MG PO TABS: 50.0000 mg | ORAL_TABLET | Freq: Every day | ORAL | 3 refills | Status: DC

## 2021-10-03 NOTE — Assessment & Plan Note (Signed)
Checking HgA1c today.  

## 2021-10-03 NOTE — Assessment & Plan Note (Signed)
Flu shot up to date. Covid-19 counseled booster. Shingrix given 1st. Tetanus up to date. Cologuard up to date. Mammogram up to date, pap smear up to date. Counseled about sun safety and mole surveillance. Counseled about the dangers of distracted driving. Given 10 year screening recommendations.

## 2021-10-03 NOTE — Assessment & Plan Note (Signed)
BP at goal on losartan 50 mg daily. Checking CMP and adjust as needed. Refilled today.

## 2021-10-03 NOTE — Assessment & Plan Note (Signed)
Needs increase today to wellbutrin 300 mg daily. Refilled with 2 150 mg tablets so if she needs decrease back to 150 mg she has that ability. She is going through a lot of stressors.

## 2021-10-03 NOTE — Assessment & Plan Note (Signed)
BMI 38 but complicated by depression and hypertension and OA.

## 2021-10-30 ENCOUNTER — Other Ambulatory Visit: Payer: Self-pay

## 2021-10-30 ENCOUNTER — Ambulatory Visit (INDEPENDENT_AMBULATORY_CARE_PROVIDER_SITE_OTHER): Payer: BC Managed Care – PPO | Admitting: Sports Medicine

## 2021-10-30 ENCOUNTER — Ambulatory Visit
Admission: RE | Admit: 2021-10-30 | Discharge: 2021-10-30 | Disposition: A | Discharge status: Home / Self Care | Payer: BC Managed Care – PPO | Source: Ambulatory Visit | Attending: Sports Medicine | Admitting: Sports Medicine

## 2021-10-30 VITALS — BP 115/69 | Ht 68.0 in | Wt 251.0 lb

## 2021-10-30 DIAGNOSIS — M79672 Pain in left foot: Secondary | ICD-10-CM | POA: Insufficient documentation

## 2021-10-30 MED ORDER — NITROGLYCERIN 0.2 MG/HR TD PT24: MEDICATED_PATCH | TRANSDERMAL | 1 refills | Status: AC

## 2021-10-30 NOTE — Patient Instructions (Signed)
Thank you for coming to see me today. It was a pleasure. Today we talked about:  ? ?As we discussed, we are concerned for a stress fracture in your calcaneus.  We will have you wear a boot and rest from the exercises.  You can continue to use the nitro patches, we have provided you a refill for this.  We will obtain an x-ray of your heel, but if this is negative we can either proceed with an MRI or we can treat you for presumed stress fracture.  You can continue the icing as well. ? ?Please follow-up with [place] in 6 weeks. ? ?If you have any questions or concerns, please do not hesitate to call the office at [phone]. ? ?Best,  ? ?Ari[place] Constable, DO ?[place]  ?

## 2021-10-30 NOTE — Progress Notes (Signed)
? ?  Stephanie Andrade is a 66 y.o. female who presents to [facility] today for the following: ? ?Left Achilles tendinitis follow-up ?Last seen for the same on 9/1 ?Advised to do home exercises, heel lifts, nitro patches ?Reports that she was doing well until around Christmas time ?At that time she stopped doing the home exercises and nitro patches due to being busy ?After Christmas she restarted doing all these things, but has gotten progressively worse ?Now she feels that the pain is spreading to the medial and lateral aspect of her heel as well as at the Achilles ?States that it used to be only with activity and would start to improve as activity continued, now she is having pain at rest as well as nighttime pain ?The pain is constant ?Heel lifts were worsening the pain, so she stopped using them ?Has been doing nitro patches, but ran out 12 days ago ?Also has been getting some burning pain in the medial aspect of her foot that goes down into her sole of her foot for about 6 weeks ?No new injuries ? ? ?PMH reviewed.  ?ROS as above. ?Medications reviewed. ? ?Exam:  ?BP 115/69   Ht '5\' 8"'$  (1.727 m)   Wt 251 lb (113.9 kg)   LMP 04/06/2011 (Exact Date)   BMI 38.16 kg/m?  ?Gen: Well NAD ?MSK: ? ?Left Ankle: ?- Inspection: She has a Haglund's deformity on the left, otherwise no obvious deformity, erythema, swelling, or ecchymosis, ulcers, calluses, blisters b/l ?- Palpation: She has point tenderness palpation at the medial aspect of the calcaneus, mild tenderness palpation at the lateral aspect of the calcaneus, tenderness to palpation at the insertional aspect of the Achilles at the left calcaneus and throughout the mid substance of the tendon, no tenderness palpation over the gastrocs ?- Strength: Normal strength with dorsiflexion, plantarflexion, inversion, and eversion of foot; flexion and extension of toes b/l ?- ROM: Full ROM b/l ?- Neuro/vasc: NV intact distally bilaterally ? ? ?Limited ultrasound of left  heel ?Achilles is thickened with some hypoechoic change throughout the insertional aspect.  There is notable hyperechoic calcification within the most distal aspect of the tendon consistent with a Haglund's deformity.  There is some slight increase in Doppler flow visualized near the mid substance of the tendon on ultrasound, otherwise no significant Doppler flow.  Calcaneus also visualized in transverse and longitudinal axis with a notable bony irregularity over the medial calcaneus.  Tarsal tunnel visualized without any significant swelling noted or abnormality of the surrounding tendons. ?Impression: Left Achilles tendinitis with possible medial calcaneal stress fracture ? ?Ultrasound and interpretation by Wolfgang Phoenix. Oneida Alar, MD and [place] Constable, DO ? ? ? ?No results found. ? ? ?Assessment and Plan: ?1) Pain of left heel ?Concern for calcaneal stress fracture, also does have findings significant for Achilles tendinitis.  She has a boot at home, advised that she will use this for the next 6 weeks.  We will obtain an x-ray, she would like to defer an MRI and go ahead and treat as though it is a stress fracture.  Recommend holding off on home exercises, continue nitro patches, can go up to half of the patch.  Follow-up in 6 weeks. ? ? ?[place] Constable, D.O.  ?PGY-4 [facility]  ?10/30/2021 2:09 PM ? ?I observed and examined the patient with the [facility] resident and agree with assessment and plan.  Note reviewed and modified by me. ?Ila Mcgill, MD ?

## 2021-10-30 NOTE — Assessment & Plan Note (Signed)
Concern for calcaneal stress fracture, also does have findings significant for Achilles tendinitis.  She has a boot at home, advised that she will use this for the next 6 weeks.  We will obtain an x-ray, she would like to defer an MRI and go ahead and treat as though it is a stress fracture.  Recommend holding off on home exercises, continue nitro patches, can go up to half of the patch.  Follow-up in 6 weeks. ?

## 2021-12-09 ENCOUNTER — Ambulatory Visit (INDEPENDENT_AMBULATORY_CARE_PROVIDER_SITE_OTHER): Payer: BC Managed Care – PPO | Admitting: Sports Medicine

## 2021-12-09 VITALS — BP 131/76 | Ht 68.0 in | Wt 250.0 lb

## 2021-12-09 DIAGNOSIS — M9262 Juvenile osteochondrosis of tarsus, left ankle: Secondary | ICD-10-CM

## 2021-12-09 DIAGNOSIS — M7660 Achilles tendinitis, unspecified leg: Secondary | ICD-10-CM | POA: Diagnosis not present

## 2021-12-09 DIAGNOSIS — M79672 Pain in left foot: Secondary | ICD-10-CM | POA: Diagnosis not present

## 2021-12-09 NOTE — Progress Notes (Signed)
PCP: Hoyt Koch, MD ? ?Subjective:  ? ?HPI: ?"Stephanie Andrade" is a pleasant 66 y.o. female here for follow-up of left achilles and heel pain. ? ?She was last seen on 10/30/2021 and had an ultrasound which showed some Achilles tendinitis and some spur changes along the calcaneus.  She did have an x-ray which showed moderate plantar and superior calcaneal spurs.  There was a concern for possible stress fracture of the calcaneus at that time, and so her medical team did discuss the option of being put into a short leg walking boot. ? ?Patient states that her pain has definitely been improving since she has been adherent to the nitro patches, ice and heat and her home exercises. Using 2, 1/4 patches of nitroglycerin. She states since her pain was better she decided not to go into the boot and has not been wearing that.  She continues to be active, walking twice a day.  She will have some mild pain at times with walking, but more so some soreness following this which gets better with ice and heat, she is also taking some aspirin.  Is any new injury. ? ?BP 131/76   Ht '5\' 8"'$  (1.727 m)   Wt 250 lb (113.4 kg)   LMP 04/06/2011 (Exact Date)   BMI 38.01 kg/m?  ? ?   ? View : No data to display.  ?  ?  ?  ? ? ?   ? View : No data to display.  ?  ?  ?  ?DG Os Calcis Left ?CLINICAL DATA:  Left heel pain ? ?EXAM: ?LEFT OS CALCIS - 2+ VIEW ? ?COMPARISON:  None. ? ?FINDINGS: ?Moderate plantar and small superior calcaneal spurs are present. No ?fracture or dislocation. No lytic or blastic bone lesion. Soft ?tissues are unremarkable. ? ?IMPRESSION: ?Moderate plantar and small superior calcaneal spurs. ? ?Electronically Signed ?  By: Fidela Salisbury M.D. ?  On: 11/01/2021 23:05 ? ? ? ?    ?Objective:  ?Physical Exam: ?Gen: Well-appearing, in no acute distress; non-toxic ?CV: Regular Rate. Well-perfused. Warm.  ?Resp: Breathing unlabored on room air; no wheezing. ?Psych: Fluid speech in conversation; appropriate affect; normal thought  process ?Neuro: Sensation intact throughout. No gross coordination deficits.  ?MSK:  ? ?Ankle/Foot, left: There is some tenderness noted on the superior aspect of the calcaneus.  There does appear to be a small Haglund's deformity with a bony prominence off the superior aspect of the calcaneus. Negative squeeze test on the inferior aspect of the calcaneus.  There is no overlying erythema, ecchymosis or swelling.  Very mild TTP at the distal aspect of the Achilles insertion point.  Normal inversion/eversion stress testing.  Range of motion is full in all directions, although dorsiflexion is to only about 90 degrees.  Strength 5/5 in all directions.  Heel raise bilaterally with intact posterior tibialis tendon.  She is able to walk 4 steps without antalgic gait.  Negative Tinel's test at the tarsal tunnel. ? ?  ?Assessment & Plan:  ?1.  Left heel pain ?2.  Left Achilles tendinopathy ?3.  Haglund's deformity, left heel ? ?It is reassuring that her heel pain is only localized over her Haglund's deformity and near the insertion of the Achilles, and not the remainder of the calcaneus.  We discussed that this point, this moves [place] away from calcaneal stress fracture at this time, she did not use the SLWB either.  She has gotten 50% better since last time while using the two 1/4  patches of nitroglycerin, HEP, and the ice and heat.  At this point, she is seeing improvement so we will continue to stay the course. She may also take over-the-counter anti-inflammatories as needed.  We will see her back in 6 weeks and at that time likely reultrasound the heel and Achilles to look for ultrasonographic evidence of healing.  She may call or return sooner if any questions arise. ? ?[name], DO ?PGY-4, Sports Medicine Fellow ?Stephanie Andrade ? ?This note was dictated using Dragon naturally speaking software and may contain errors in syntax, spelling, or content which have not been identified prior to signing this  note.  ? ?I observed and examined the patient with the resident and agree with assessment and plan.  Note reviewed and modified by me. ?Ila Mcgill, MD ? ? ?

## 2021-12-31 ENCOUNTER — Ambulatory Visit (INDEPENDENT_AMBULATORY_CARE_PROVIDER_SITE_OTHER): Payer: BC Managed Care – PPO

## 2021-12-31 ENCOUNTER — Other Ambulatory Visit: Payer: BC Managed Care – PPO

## 2021-12-31 DIAGNOSIS — Z23 Encounter for immunization: Secondary | ICD-10-CM

## 2021-12-31 LAB — COMPREHENSIVE METABOLIC PANEL
ALT: 18 U/L (ref 0–35)
AST: 18 U/L (ref 0–37)
Albumin: 4.2 g/dL (ref 3.5–5.2)
Alkaline Phosphatase: 82 U/L (ref 39–117)
BUN: 21 mg/dL (ref 6–23)
CO2: 31 mEq/L (ref 19–32)
Calcium: 9.3 mg/dL (ref 8.4–10.5)
Chloride: 103 mEq/L (ref 96–112)
Creatinine, Ser: 0.73 mg/dL (ref 0.40–1.20)
GFR: 86.63 mL/min (ref >60.00)
Glucose, Bld: 101 mg/dL — ABNORMAL HIGH (ref 70–99)
Potassium: 4.4 mEq/L (ref 3.5–5.1)
Sodium: 140 mEq/L (ref 135–145)
Total Bilirubin: 0.5 mg/dL (ref 0.2–1.2)
Total Protein: 7.3 g/dL (ref 6.0–8.3)

## 2021-12-31 LAB — CBC
HCT: 37.2 % (ref 36.0–46.0)
Hemoglobin: 12.3 g/dL (ref 12.0–15.0)
MCHC: 33.2 g/dL (ref 30.0–36.0)
MCV: 80.8 fl (ref 78.0–100.0)
Platelets: 198 10*3/uL (ref 150.0–400.0)
RBC: 4.6 Mil/uL (ref 3.87–5.11)
RDW: 13.7 % (ref 11.5–15.5)
WBC: 4.6 10*3/uL (ref 4.0–10.5)

## 2021-12-31 LAB — LIPID PANEL
Cholesterol: 161 mg/dL (ref 0–200)
HDL: 66.7 mg/dL (ref >39.00)
LDL Cholesterol: 78 mg/dL (ref 0–99)
NonHDL: 94.24
Total CHOL/HDL Ratio: 2
Triglycerides: 83 mg/dL (ref 0.0–149.0)
VLDL: 16.6 mg/dL (ref 0.0–40.0)

## 2021-12-31 LAB — HEMOGLOBIN A1C: Hgb A1c MFr Bld: 5.7 % (ref 4.6–6.5)

## 2021-12-31 NOTE — Progress Notes (Signed)
Pt came into the office for her 2nd shingles vaccine. She tolerated the injection well. No questions or concerns.  ?

## 2022-01-20 ENCOUNTER — Ambulatory Visit (INDEPENDENT_AMBULATORY_CARE_PROVIDER_SITE_OTHER): Payer: BC Managed Care – PPO | Admitting: Sports Medicine

## 2022-01-20 VITALS — BP 130/58 | Ht 68.0 in | Wt 250.0 lb

## 2022-01-20 DIAGNOSIS — M1711 Unilateral primary osteoarthritis, right knee: Secondary | ICD-10-CM

## 2022-01-20 DIAGNOSIS — M79672 Pain in left foot: Secondary | ICD-10-CM | POA: Diagnosis not present

## 2022-01-20 DIAGNOSIS — M9262 Juvenile osteochondrosis of tarsus, left ankle: Secondary | ICD-10-CM | POA: Diagnosis not present

## 2022-01-20 NOTE — Progress Notes (Signed)
PCP: Hoyt Koch, MD  Subjective:   HPI: Stephanie Andrade is a pleasant 66 y.o. female here for follow-up of achilles tendinopathy and to discuss knee pain.  Left heel -patient states she is doing great, she currently has no pain at all.  She continues a Nitropatch as well as icing and doing some of the stretches.  She continues with walking multiple times a day without any issues.  Right knee -patient has known severe OA of the right knee and has been contemplating knee replacement surgery.  She does think she wants to go through with this and would like Dr. Maureen Ralphs to perform this when she is ready.  She would like to start preparing for this as she has been doing some home quadricep exercises.  She also has started back on weight watchers and has started getting back into the pool for aquatic therapy.  She does take Tylenol in the a.m. and ibuprofen in the p.m.  Which does help the pain to some extent.  She gets pain more so in the medial joint line but the pain will radiate up and down into the shin after she is walking for long periods of time.  Denies any giving way of the knee.  She will get intermittent swelling and stiffness.   BP (!) 130/58   Ht '5\' 8"'$  (1.727 m)   Wt 250 lb (113.4 kg)   LMP 04/06/2011 (Exact Date)   BMI 38.01 kg/m       View : No data to display.              View : No data to display.              Objective:  Physical Exam:  Gen: Well-appearing, in no acute distress; non-toxic CV: Regular Rate. Well-perfused. Warm.  Resp: Breathing unlabored on room air; no wheezing. Psych: Fluid speech in conversation; appropriate affect; normal thought process Neuro: Sensation intact throughout. No gross coordination deficits.  MSK:   - Right knee: Evaluation of the right knee does demonstrate a trace effusion.  There is some medial joint line TTP.  No erythema or significant warmth to the knee.  No varus or valgus instability.  There is some crepitus with  extension of the knee.  Range of motion is limited from 0-95 to 100 degrees, compared to flexion of the contralateral knee to about 120 degrees.  Strength is 5/5 throughout.  Negative Lachman's, ligamental testing intact.  Neurovascular intact distally.  Assessment & Plan:  1.  Right knee osteoarthritis with severe medial compartment narrowing 2.  Left heel pain with known Haglund's deformity -significantly improved  We are very pleased with the improvement of the left heel.  She will finish off her last week or so of the Nitropatch protocol and then discontinue this.  She is to continue her Achilles HEP and icing as needed.  In terms of the right knee, x-rays from 2020 did show near bone-on-bone change of the medial compartment.  She would be an excellent candidate for right knee TKA.  She would like to see Dr. Maureen Ralphs for this, we will provide information for referral today.  She will continue to work on her prerehab treatment with healthy weight loss, aquatic therapy.  We did give her some home exercises to work on strengthening of the quadriceps as well as increasing range of motion specifically in flexion and extension.  She can continue over-the-counter anti-inflammatories.  We will see her back if she has any  questions or setbacks, although we will plan to have her see Dr. Maureen Ralphs to discuss possible TKA, patient's goal is for around Jan 2024.  [name], DO PGY-4, Sports Medicine Fellow [place]  This note was dictated using Dragon naturally speaking software and may contain errors in syntax, spelling, or content which have not been identified prior to signing this note.   I observed and examined the patient with the [facility] resident and agree with assessment and plan.  Note reviewed and modified by me. Ila Mcgill, MD

## 2022-01-21 ENCOUNTER — Other Ambulatory Visit: Payer: Self-pay | Admitting: Internal Medicine

## 2022-01-21 ENCOUNTER — Other Ambulatory Visit: Payer: Self-pay | Admitting: Family Medicine

## 2022-01-29 ENCOUNTER — Other Ambulatory Visit: Payer: Self-pay | Admitting: Sports Medicine

## 2022-02-06 ENCOUNTER — Other Ambulatory Visit: Payer: Self-pay | Admitting: *Deleted

## 2022-02-06 MED ORDER — GABAPENTIN 300 MG PO CAPS: 300.0000 mg | ORAL_CAPSULE | Freq: Every day | ORAL | 3 refills | Status: AC

## 2022-02-10 ENCOUNTER — Encounter: Payer: Self-pay | Admitting: Internal Medicine

## 2022-02-11 ENCOUNTER — Ambulatory Visit: Payer: BC Managed Care – PPO | Admitting: Family Medicine

## 2022-02-11 NOTE — Patient Instructions (Signed)
[facility] Dr Maureen Ralphs 8.24.23 @ [place]

## 2022-02-18 ENCOUNTER — Ambulatory Visit: Payer: BC Managed Care – PPO | Admitting: Family Medicine

## 2022-02-18 ENCOUNTER — Ambulatory Visit (INDEPENDENT_AMBULATORY_CARE_PROVIDER_SITE_OTHER): Payer: BC Managed Care – PPO

## 2022-02-18 ENCOUNTER — Ambulatory Visit (INDEPENDENT_AMBULATORY_CARE_PROVIDER_SITE_OTHER): Payer: BC Managed Care – PPO | Admitting: Internal Medicine

## 2022-02-18 ENCOUNTER — Encounter: Payer: Self-pay | Admitting: Internal Medicine

## 2022-02-18 VITALS — BP 138/74 | HR 88 | Temp 98.2°F | Ht 68.0 in | Wt 261.5 lb

## 2022-02-18 DIAGNOSIS — R0602 Shortness of breath: Secondary | ICD-10-CM | POA: Diagnosis not present

## 2022-02-18 DIAGNOSIS — I10 Essential (primary) hypertension: Secondary | ICD-10-CM | POA: Diagnosis not present

## 2022-02-18 DIAGNOSIS — R051 Acute cough: Secondary | ICD-10-CM

## 2022-02-18 DIAGNOSIS — R7303 Prediabetes: Secondary | ICD-10-CM

## 2022-02-18 DIAGNOSIS — R059 Cough, unspecified: Secondary | ICD-10-CM | POA: Insufficient documentation

## 2022-02-18 DIAGNOSIS — R062 Wheezing: Secondary | ICD-10-CM | POA: Diagnosis not present

## 2022-02-18 MED ORDER — PREDNISONE 10 MG PO TABS: ORAL_TABLET | ORAL | 0 refills | Status: DC

## 2022-02-18 MED ORDER — AMOXICILLIN-POT CLAVULANATE 875-125 MG PO TABS: 1.0000 | ORAL_TABLET | Freq: Two times a day (BID) | ORAL | 0 refills | Status: DC

## 2022-02-18 MED ORDER — HYDROCODONE BIT-HOMATROP MBR 5-1.5 MG/5ML PO SOLN: 5.0000 mL | Freq: Four times a day (QID) | ORAL | 0 refills | Status: AC | PRN

## 2022-02-18 NOTE — Assessment & Plan Note (Signed)
BP Readings from Last 3 Encounters:  02/18/22 138/74  01/20/22 (!) 130/58  12/09/21 131/76   Stable, pt to continue medical treatment losartan 50 mg qd

## 2022-02-18 NOTE — Patient Instructions (Signed)
Please take all new medication as prescribed - the antibiotic, cough medicine, and prednisone  Please continue all other medications as before, and refills have been done if requested.  Please have the pharmacy call with any other refills you may need.  Please keep your appointments with your specialists as you may have planned  Please go to the XRAY Department in the first floor for the x-ray testing  You will be contacted by phone if any changes need to be made immediately.  Otherwise, you will receive a letter about your results with an explanation, but please check with MyChart first.  Please remember to sign up for MyChart if you have not done so, as this will be important to you in the future with finding out test results, communicating by private email, and scheduling acute appointments online when needed.

## 2022-02-18 NOTE — Assessment & Plan Note (Signed)
Mild to mod, for prednisone taper, declines inhaler, for cxr as well,  to f/u any worsening symptoms or concerns

## 2022-02-18 NOTE — Progress Notes (Signed)
Patient ID: Stephanie Andrade, female   DOB: 08/25/1955, 66 y.o.   MRN: [id number]        Chief Complaint: follow up cough, wheezing with sob, hyperglycemia, htn       HPI:  Stephanie Andrade is a 66 y.o. female Here with acute onset mild to mod 2-3 days ST, HA, general weakness and malaise, with prod cough greenish brown sputum, but Pt denies chest pain, increased sob or doe, wheezing, orthopnea, PND, increased LE swelling, palpitations, dizziness or syncope except for onset mild wheezing and sob since last Pm.  Also some mild diarrhea this am as well, and mild upper bilateral back pain.   Pt denies polydipsia, polyuria, or new focal neuro s/s.    Pt denies recent wt loss, night sweats, loss of appetite, or other constitutional symptoms       Wt Readings from Last 3 Encounters:  02/18/22 261 lb 8 oz (118.6 kg)  01/20/22 250 lb (113.4 kg)  12/09/21 250 lb (113.4 kg)   BP Readings from Last 3 Encounters:  02/18/22 138/74  01/20/22 (!) 130/58  12/09/21 131/76         Past Medical History:  Diagnosis Date   Anemia    Arthritis    Asthma    Depression    Family history of adverse reaction to anesthesia    sister, brother - PONV   Family history of ovarian cancer    Fibroids    GERD (gastroesophageal reflux disease)    occ   History of bronchitis    History of sarcoidosis    Hypertension    Pneumonia    hx   PONV (postoperative nausea and vomiting) 03/26/2016   Wears glasses    Past Surgical History:  Procedure Laterality Date   CHOLECYSTECTOMY N/A 09/22/2016   Procedure: LAPAROSCOPIC CHOLECYSTECTOMY WITH INTRAOPERATIVE CHOLANGIOGRAM;  Surgeon: Harriette Bouillon, MD;  Location: [facility] OR;  Service: General;  Laterality: N/A;   COLONOSCOPY     ESOPHAGOGASTRODUODENOSCOPY     JOINT REPLACEMENT     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   Procedure: RIGHT TOTAL HIP ARTHROPLASTY ANTERIOR APPROACH;  Surgeon: Kathryne Hitch, MD;  Location: [facility] OR;  Service: Orthopedics;  Laterality:  Right;   TOTAL HIP ARTHROPLASTY Left 08/24/2017   TOTAL HIP ARTHROPLASTY Left 08/24/2017   Procedure: LEFT TOTAL HIP ARTHROPLASTY ANTERIOR APPROACH;  Surgeon: Kathryne Hitch, MD;  Location: [facility] OR;  Service: Orthopedics;  Laterality: Left;    reports that she quit smoking about 12 years ago. Her smoking use included cigarettes. She has never used smokeless tobacco. She reports current alcohol use. She reports that she does not use drugs. family history includes Arthritis in her father and sister; Breast cancer in her maternal grandfather; Cancer in her mother; Hypertension in her brother and father. Allergies  Allergen Reactions   Mushroom Extract Complex Anaphylaxis and Hives   Agave Nausea And Vomiting   Codeine Nausea Only   Dextromethorphan Nausea And Vomiting   Levaquin [Levofloxacin In D5w] Other (See Comments)    Myalgias   Macrobid  [Nitrofurantoin Macrocrystal] Nausea And Vomiting   Macrobid [Nitrofurantoin] Nausea And Vomiting   Tape Itching and Other (See Comments)    Please use paper tape   Tessalon Perles Nausea And Vomiting   Current Outpatient Medications on File Prior to Visit  Medication Sig Dispense Refill   azelastine (ASTELIN) 0.1 % nasal spray Place 1 spray into both nostrils 2 (two) times daily. Use in each nostril as  directed 30 mL 3   buPROPion (WELLBUTRIN XL) 150 MG 24 hr tablet Take 2 tablets (300 mg total) by mouth daily. 180 tablet 3   Cholecalciferol (VITAMIN D3) 10000 units TABS Take 10,000 Units by mouth daily.      diclofenac sodium (VOLTAREN) 1 % GEL Apply 2 g topically 4 (four) times daily. 100 g 3   docusate sodium (COLACE) 100 MG capsule Take 100 mg by mouth daily.     EPINEPHrine 0.3 mg/0.3 mL IJ SOAJ injection Inject 0.3 mLs (0.3 mg total) into the muscle as needed for anaphylaxis. 2 each 1   gabapentin (NEURONTIN) 300 MG capsule Take 1 capsule (300 mg total) by mouth at bedtime. 90 capsule 3   glucosamine-chondroitin 500-400 MG tablet Take 1  tablet by mouth 3 (three) times daily.     HYDROcodone-acetaminophen (NORCO/VICODIN) 5-325 MG tablet Take 1 tablet by mouth 2 (two) times daily as needed for moderate pain. 60 tablet 0   losartan (COZAAR) 50 MG tablet Take 1 tablet (50 mg total) by mouth daily. 90 tablet 3   methocarbamol (ROBAXIN) 500 MG tablet Take 1 tablet (500 mg total) by mouth 3 (three) times daily. 90 tablet 1   Multiple Vitamin (MULTI-VITAMIN PO) Take 1 tablet by mouth daily.     mupirocin ointment (BACTROBAN) 2 % Place 1 application into the nose 2 (two) times daily. 22 g 0   nitroGLYCERIN (NITRODUR - DOSED IN MG/24 HR) 0.2 mg/hr patch Use 1/4 patch daily to the affected area. 30 patch 1   traMADol (ULTRAM) 50 MG tablet TAKE 1 TABLET EVERY TWELVE HOURS AS NEEDED. 45 tablet 0   zolpidem (AMBIEN) 5 MG tablet Take 1 tablet (5 mg total) by mouth at bedtime as needed. for sleep 90 tablet 0   No current facility-administered medications on file prior to visit.        ROS:  All others reviewed and negative.  Objective        PE:  BP 138/74   Pulse 88   Temp 98.2 F (36.8 C) (Oral)   Ht 5\' 8"  (1.727 m)   Wt 261 lb 8 oz (118.6 kg)   LMP 04/06/2011 (Exact Date)   SpO2 97%   BMI 39.76 kg/m                 Constitutional: Pt appears in NAD, mild ill               HENT: Head: NCAT.                Right Ear: External ear normal.                 Left Ear: External ear normal. Bilat tm's with mild erythema.  Max sinus areas mild tender.  Pharynx with mild erythema, no exudate               Eyes: . Pupils are equal, round, and reactive to light. Conjunctivae and EOM are normal               Nose: without d/c or deformity               Neck: Neck supple. Gross normal ROM               Cardiovascular: Normal rate and regular rhythm.                 Pulmonary/Chest: Effort normal and breath sounds decreased without rales but with  mild bilateral wheezing.                               Neurological: Pt is alert. At baseline  orientation, motor grossly intact               Skin: Skin is warm. No rashes, no other new lesions, LE edema - none               Psychiatric: Pt behavior is normal without agitation   Micro: none  Cardiac tracings I have personally interpreted today:  none  Pertinent Radiological findings (summarize): none   Lab Results  Component Value Date   WBC 4.6 12/31/2021   HGB 12.3 12/31/2021   HCT 37.2 12/31/2021   PLT 198.0 12/31/2021   GLUCOSE 101 (H) 12/31/2021   CHOL 161 12/31/2021   TRIG 83.0 12/31/2021   HDL 66.70 12/31/2021   LDLCALC 78 12/31/2021   ALT 18 12/31/2021   AST 18 12/31/2021   NA 140 12/31/2021   K 4.4 12/31/2021   CL 103 12/31/2021   CREATININE 0.73 12/31/2021   BUN 21 12/31/2021   CO2 31 12/31/2021   TSH 0.66 09/26/2020   HGBA1C 5.7 12/31/2021   MICROALBUR <0.7 01/06/2019   Assessment/Plan:  Stephanie Andrade is a 80 y.o. White or Caucasian [1] female with  has a past medical history of Anemia, Arthritis, Asthma, Depression, Family history of adverse reaction to anesthesia, Family history of ovarian cancer, Fibroids, GERD (gastroesophageal reflux disease), History of bronchitis, History of sarcoidosis, Hypertension, Pneumonia, PONV (postoperative nausea and vomiting) (03/26/2016), and Wears glasses.  Cough Mild to mod, c/w bronchitis vs pna, for antibx course, cough med prn,  to f/u any worsening symptoms or concerns  Wheezing Mild to mod, for prednisone taper, declines inhaler, for cxr as well,  to f/u any worsening symptoms or concerns  Prediabetes Lab Results  Component Value Date   HGBA1C 5.7 12/31/2021   Stable, pt to continue current medical treatment  - diet, wt control, excercise   Hypertension BP Readings from Last 3 Encounters:  02/18/22 138/74  01/20/22 (!) 130/58  12/09/21 131/76   Stable, pt to continue medical treatment losartan 50 mg qd  Followup: Return if symptoms worsen or fail to improve.  Oliver Barre, MD 02/18/2022 9:15  PM [facility]

## 2022-02-18 NOTE — Assessment & Plan Note (Signed)
Lab Results  Component Value Date   HGBA1C 5.7 12/31/2021   Stable, pt to continue current medical treatment  - diet, wt control, excercise

## 2022-02-18 NOTE — Assessment & Plan Note (Signed)
Mild to mod, c/w bronchitis vs pna, for antibx course, cough med prn,  to f/u any worsening symptoms or concerns 

## 2022-04-06 ENCOUNTER — Encounter: Payer: Self-pay | Admitting: Internal Medicine

## 2022-04-06 ENCOUNTER — Other Ambulatory Visit (HOSPITAL_COMMUNITY): Payer: Self-pay

## 2022-04-06 ENCOUNTER — Telehealth (INDEPENDENT_AMBULATORY_CARE_PROVIDER_SITE_OTHER): Payer: Medicare Other | Admitting: Internal Medicine

## 2022-04-06 DIAGNOSIS — U071 COVID-19: Secondary | ICD-10-CM | POA: Insufficient documentation

## 2022-04-06 DIAGNOSIS — F331 Major depressive disorder, recurrent, moderate: Secondary | ICD-10-CM | POA: Diagnosis not present

## 2022-04-06 DIAGNOSIS — R051 Acute cough: Secondary | ICD-10-CM

## 2022-04-06 MED ORDER — BUPROPION HCL ER (XL) 150 MG PO TB24
450.0000 mg | ORAL_TABLET | Freq: Every day | ORAL | 3 refills | Status: DC
Start: 2022-04-06 — End: 2022-10-01

## 2022-04-06 MED ORDER — MOLNUPIRAVIR EUA 200MG CAPSULE
4.0000 | ORAL_CAPSULE | Freq: Two times a day (BID) | ORAL | 0 refills | Status: AC
  Filled 2022-04-06 (×2): qty 40, 5d supply, fill #0

## 2022-04-06 MED ORDER — PROMETHAZINE VC 6.25-5 MG/5ML PO SYRP: 5.0000 mL | ORAL_SOLUTION | Freq: Four times a day (QID) | ORAL | 0 refills | Status: DC | PRN

## 2022-04-06 NOTE — Assessment & Plan Note (Signed)
Some worsening in recent month (anniversary of husband's passing and illness). Increase to 450 mg wellbutrin daily. Once stable can reduce to 300 mg daily (this was effective prior to stressors).

## 2022-04-06 NOTE — Assessment & Plan Note (Signed)
Did get hives with hydrocodone cough syrup given in July. She will try promethazine/phenylephrine cough medicine. If unavailable will do promethazine liquid single agent.

## 2022-04-06 NOTE — Progress Notes (Signed)
Virtual Visit via Video Note  I connected with Stephanie Andrade on 04/06/22 at  8:40 AM EDT by a video enabled telemedicine application and verified that I am speaking with the correct person using two identifiers.  The patient and the provider were at separate locations throughout the entire encounter. Patient location: home, Provider location: work   I discussed the limitations of evaluation and management by telemedicine and the availability of in person appointments. The patient expressed understanding and agreed to proceed. The patient and the provider were the only parties present for the visit unless noted in HPI below.  History of Present Illness: The patient is a 66 y.o. female with visit for covid-19 positive. Started yesterday. Some worsening depression due to life stressors and illness recently.  Observations/Objective: Appearance: appears sick, breathing appears normal, coughing during visit and voice nasally, casual grooming, mental status is A and O times 3  Assessment and Plan: See problem oriented charting  Follow Up Instructions: Rx molnupiravir and cough medicine, increase wellbutrin to 450 mg daily.  I discussed the assessment and treatment plan with the patient. The patient was provided an opportunity to ask questions and all were answered. The patient agreed with the plan and demonstrated an understanding of the instructions.   The patient was advised to call back or seek an in-person evaluation if the symptoms worsen or if the condition fails to improve as anticipated.  Hoyt Koch, MD

## 2022-04-06 NOTE — Assessment & Plan Note (Signed)
Symptoms started 1 day ago. Rx molnupiravir done as well as promethazine/pheneylephrine for cough (due to allergies with codeine/hydrocodone/dextromorphan).

## 2022-04-08 ENCOUNTER — Encounter: Payer: Self-pay | Admitting: Internal Medicine

## 2022-04-14 NOTE — Telephone Encounter (Signed)
Patient is experiencing vaginal dryness and is wondering if it could be a side effect of the Molnupirvir? Patient also sent another separate MyChart message to note there is no itchiness. Has been using hydrocortisone cream.

## 2022-05-01 DIAGNOSIS — M25561 Pain in right knee: Secondary | ICD-10-CM | POA: Diagnosis not present

## 2022-05-07 ENCOUNTER — Encounter: Payer: Self-pay | Admitting: Family Medicine

## 2022-05-07 ENCOUNTER — Ambulatory Visit (INDEPENDENT_AMBULATORY_CARE_PROVIDER_SITE_OTHER): Payer: Medicare Other | Admitting: Family Medicine

## 2022-05-07 VITALS — BP 120/74 | HR 89 | Temp 98.5°F | Ht 68.0 in | Wt 257.0 lb

## 2022-05-07 DIAGNOSIS — N3001 Acute cystitis with hematuria: Secondary | ICD-10-CM

## 2022-05-07 DIAGNOSIS — N390 Urinary tract infection, site not specified: Secondary | ICD-10-CM | POA: Diagnosis not present

## 2022-05-07 DIAGNOSIS — R319 Hematuria, unspecified: Secondary | ICD-10-CM

## 2022-05-07 LAB — POCT URINALYSIS DIPSTICK
Bilirubin, UA: POSITIVE
Blood, UA: POSITIVE
Glucose, UA: NEGATIVE
Ketones, UA: NEGATIVE
Nitrite, UA: POSITIVE
Protein, UA: NEGATIVE
Spec Grav, UA: >=1.03 — AB (ref 1.010–1.025)
Urobilinogen, UA: 1 E.U./dL
pH, UA: 5.5 (ref 5.0–8.0)

## 2022-05-07 MED ORDER — CEPHALEXIN 500 MG PO CAPS: 500.0000 mg | ORAL_CAPSULE | Freq: Two times a day (BID) | ORAL | 0 refills | Status: DC

## 2022-05-07 NOTE — Patient Instructions (Signed)
Take the antibiotic as prescribed. You may take AZO today if needed.   Increase your water intake.   Follow up if worsening or not back to baseline when you complete the antibiotic.

## 2022-05-07 NOTE — Progress Notes (Signed)
Subjective:  Stephanie Andrade is a 66 y.o. female who complains of possible urinary tract infection.  Symptoms include  urinary frequency and dysuria . Denies fever, chills, abdominal pain, back pain, N/V. Using Azo for current symptoms.   [place] and being in a wet bathing suit.  Denies vaginal discharge or itching.    Past Medical History:  Diagnosis Date   Anemia    Arthritis    Asthma    Depression    Family history of adverse reaction to anesthesia    sister, brother - PONV   Family history of ovarian cancer    Fibroids    GERD (gastroesophageal reflux disease)    occ   History of bronchitis    History of sarcoidosis    Hypertension    Pneumonia    hx   PONV (postoperative nausea and vomiting) 03/26/2016   Wears glasses     ROS as in subjective  Reviewed allergies, medications, past medical, surgical, and social history.    Objective: Vitals:   05/07/22 1059  BP: 120/74  Pulse: 89  Temp: 98.5 F (36.9 C)  SpO2: 96%    General appearance: alert, no distress, WD/WN, female Abdomen: +bs, soft, non tender, non distended Back: no CVA tenderness GU: Deferred     Laboratory:  Urine dipstick: Specific gravity greater than 1.030, 1+ leukocytes, positive nitrites     Assessment: Acute cystitis with hematuria - Plan: cephALEXin (KEFLEX) 500 MG capsule  Urinary tract infection with hematuria, site unspecified - Plan: POCT urinalysis dipstick, Urine Culture, Urine Culture   Plan: Discussed symptoms, diagnosis, possible complications, and usual course of illness.  Keflex prescribed advised increased water intake, can use OTC Tylenol for pain.      Urine culture sent.  Call or return if worse or not improving.

## 2022-05-08 ENCOUNTER — Ambulatory Visit: Payer: BC Managed Care – PPO | Admitting: Family Medicine

## 2022-05-10 LAB — URINE CULTURE

## 2022-05-11 DIAGNOSIS — M25561 Pain in right knee: Secondary | ICD-10-CM | POA: Diagnosis not present

## 2022-05-11 DIAGNOSIS — M25562 Pain in left knee: Secondary | ICD-10-CM | POA: Diagnosis not present

## 2022-05-15 ENCOUNTER — Telehealth: Payer: Self-pay

## 2022-05-15 NOTE — Telephone Encounter (Signed)
Patient is scheduled for her mammogram at [facility], 05/20/22.

## 2022-05-20 DIAGNOSIS — Z1231 Encounter for screening mammogram for malignant neoplasm of breast: Secondary | ICD-10-CM | POA: Diagnosis not present

## 2022-05-20 DIAGNOSIS — Z01419 Encounter for gynecological examination (general) (routine) without abnormal findings: Secondary | ICD-10-CM | POA: Diagnosis not present

## 2022-05-20 DIAGNOSIS — N952 Postmenopausal atrophic vaginitis: Secondary | ICD-10-CM | POA: Diagnosis not present

## 2022-05-20 DIAGNOSIS — Z8041 Family history of malignant neoplasm of ovary: Secondary | ICD-10-CM | POA: Diagnosis not present

## 2022-09-10 DIAGNOSIS — M25561 Pain in right knee: Secondary | ICD-10-CM | POA: Diagnosis not present

## 2022-09-10 DIAGNOSIS — M25562 Pain in left knee: Secondary | ICD-10-CM | POA: Diagnosis not present

## 2022-10-01 ENCOUNTER — Ambulatory Visit (INDEPENDENT_AMBULATORY_CARE_PROVIDER_SITE_OTHER): Payer: Medicare Other | Admitting: Internal Medicine

## 2022-10-01 ENCOUNTER — Encounter: Payer: Self-pay | Admitting: Internal Medicine

## 2022-10-01 VITALS — BP 140/80 | HR 79 | Temp 97.8°F | Ht 68.0 in | Wt 255.0 lb

## 2022-10-01 DIAGNOSIS — Z0000 Encounter for general adult medical examination without abnormal findings: Secondary | ICD-10-CM

## 2022-10-01 DIAGNOSIS — I10 Essential (primary) hypertension: Secondary | ICD-10-CM | POA: Diagnosis not present

## 2022-10-01 DIAGNOSIS — Z8639 Personal history of other endocrine, nutritional and metabolic disease: Secondary | ICD-10-CM

## 2022-10-01 DIAGNOSIS — M1711 Unilateral primary osteoarthritis, right knee: Secondary | ICD-10-CM

## 2022-10-01 DIAGNOSIS — R7303 Prediabetes: Secondary | ICD-10-CM | POA: Diagnosis not present

## 2022-10-01 DIAGNOSIS — F3342 Major depressive disorder, recurrent, in full remission: Secondary | ICD-10-CM

## 2022-10-01 LAB — COMPREHENSIVE METABOLIC PANEL
ALT: 16 U/L (ref 0–35)
AST: 18 U/L (ref 0–37)
Albumin: 4.1 g/dL (ref 3.5–5.2)
Alkaline Phosphatase: 84 U/L (ref 39–117)
BUN: 21 mg/dL (ref 6–23)
CO2: 29 mEq/L (ref 19–32)
Calcium: 9.6 mg/dL (ref 8.4–10.5)
Chloride: 102 mEq/L (ref 96–112)
Creatinine, Ser: 0.85 mg/dL (ref 0.40–1.20)
GFR: 71.79 mL/min (ref >60.00)
Glucose, Bld: 76 mg/dL (ref 70–99)
Potassium: 4 mEq/L (ref 3.5–5.1)
Sodium: 139 mEq/L (ref 135–145)
Total Bilirubin: 0.7 mg/dL (ref 0.2–1.2)
Total Protein: 7.4 g/dL (ref 6.0–8.3)

## 2022-10-01 LAB — LIPID PANEL
Cholesterol: 167 mg/dL (ref 0–200)
HDL: 81.7 mg/dL (ref >39.00)
LDL Cholesterol: 73 mg/dL (ref 0–99)
NonHDL: 85.26
Total CHOL/HDL Ratio: 2
Triglycerides: 61 mg/dL (ref 0.0–149.0)
VLDL: 12.2 mg/dL (ref 0.0–40.0)

## 2022-10-01 LAB — CBC
HCT: 38.8 % (ref 36.0–46.0)
Hemoglobin: 12.8 g/dL (ref 12.0–15.0)
MCHC: 32.9 g/dL (ref 30.0–36.0)
MCV: 80.6 fl (ref 78.0–100.0)
Platelets: 244 10*3/uL (ref 150.0–400.0)
RBC: 4.81 Mil/uL (ref 3.87–5.11)
RDW: 14.4 % (ref 11.5–15.5)
WBC: 7.2 10*3/uL (ref 4.0–10.5)

## 2022-10-01 LAB — HEMOGLOBIN A1C: Hgb A1c MFr Bld: 5.8 % (ref 4.6–6.5)

## 2022-10-01 LAB — VITAMIN D 25 HYDROXY (VIT D DEFICIENCY, FRACTURES): VITD: 92.47 ng/mL (ref 30.00–100.00)

## 2022-10-01 MED ORDER — ZEPBOUND 7.5 MG/0.5ML ~~LOC~~ SOAJ: 7.5000 mg | SUBCUTANEOUS | 0 refills | Status: DC

## 2022-10-01 MED ORDER — ZEPBOUND 2.5 MG/0.5ML ~~LOC~~ SOAJ: 2.5000 mg | SUBCUTANEOUS | 0 refills | Status: DC

## 2022-10-01 MED ORDER — BUPROPION HCL ER (XL) 150 MG PO TB24
450.0000 mg | ORAL_TABLET | Freq: Every day | ORAL | 3 refills | Status: DC
Start: 2022-10-01 — End: 2023-10-12

## 2022-10-01 MED ORDER — LOSARTAN POTASSIUM 50 MG PO TABS
50.0000 mg | ORAL_TABLET | Freq: Every day | ORAL | 3 refills | Status: DC
Start: 2022-10-01 — End: 2023-11-29

## 2022-10-01 MED ORDER — ZEPBOUND 5 MG/0.5ML ~~LOC~~ SOAJ: 5.0000 mg | SUBCUTANEOUS | 0 refills | Status: DC

## 2022-10-01 NOTE — Progress Notes (Signed)
Subjective:   Patient ID: Stephanie Andrade, female    DOB: 08-24-1955, 67 y.o.   MRN: [id number]  HPI Here for new to medicare wellness and physical, no new complaints. Please see A/P for status and treatment of chronic medical problems.   Diet: heart healthy Physical activity: sedentary Depression/mood screen: negative Hearing: intact to whispered voice Visual acuity: grossly normal with lens, performs annual eye exam  ADLs: capable Fall risk: none Home safety: good Cognitive evaluation: intact to orientation, naming, recall and repetition EOL planning: adv directives discussed, in place  [employer] Visit from 10/01/2022 in [facility] at [place]  PHQ-2 Total Score 0       Flowsheet Row Office Visit from 10/01/2022 in [facility] at [facility]  PHQ-9 Total Score 0         04/12/2017    2:33 PM 03/20/2020    9:31 AM 09/26/2020    9:10 AM 02/18/2022   10:01 AM 10/01/2022    9:10 AM  Fall Risk  Falls in the past year? No 0 0 0 0  Was there an injury with Fall?    0 0  Fall Risk Category Calculator     0  Patient at Risk for Falls Due to  No Fall Risks No Fall Risks    Fall risk Follow up     Falls evaluation completed   Vision Screening   Right eye Left eye Both eyes  Without correction 20/20 20/20 20/20   With correction      Functional Status Survey: Is the patient deaf or have difficulty hearing?: No Does the patient have difficulty seeing, even when wearing glasses/contacts?: No Does the patient have difficulty concentrating, remembering, or making decisions?: No Does the patient have difficulty walking or climbing stairs?: No Does the patient have difficulty dressing or bathing?: No Does the patient have difficulty doing errands alone such as visiting a doctor's office or shopping?: No  I have personally reviewed and have noted 1. The patient's medical and social history - reviewed today no changes 2. Their use  of alcohol, tobacco or illicit drugs 3. Their current medications and supplements 4. The patient's functional ability including ADL's, fall risks, home safety risks and hearing or visual impairment. 5. Diet and physical activities 6. Evidence for depression or mood disorders 7. Care team reviewed and updated 8.  The patient is not on an opioid pain medication regularly.  Patient Care Team: Myrlene Broker, MD as PCP - General (Internal Medicine) Past Medical History:  Diagnosis Date   Anemia    Arthritis    Asthma    Depression    Family history of adverse reaction to anesthesia    sister, brother - PONV   Family history of ovarian cancer    Fibroids    GERD (gastroesophageal reflux disease)    occ   History of bronchitis    History of sarcoidosis    Hypertension    Pneumonia    hx   PONV (postoperative nausea and vomiting) 03/26/2016   Wears glasses    Past Surgical History:  Procedure Laterality Date   CHOLECYSTECTOMY N/A 09/22/2016   Procedure: LAPAROSCOPIC CHOLECYSTECTOMY WITH INTRAOPERATIVE CHOLANGIOGRAM;  Surgeon: Harriette Bouillon, MD;  Location: [facility] OR;  Service: General;  Laterality: N/A;   COLONOSCOPY     ESOPHAGOGASTRODUODENOSCOPY     JOINT REPLACEMENT     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   Procedure: RIGHT TOTAL HIP ARTHROPLASTY ANTERIOR  APPROACH;  Surgeon: Kathryne Hitch, MD;  Location: [facility] OR;  Service: Orthopedics;  Laterality: Right;   TOTAL HIP ARTHROPLASTY Left 08/24/2017   TOTAL HIP ARTHROPLASTY Left 08/24/2017   Procedure: LEFT TOTAL HIP ARTHROPLASTY ANTERIOR APPROACH;  Surgeon: Kathryne Hitch, MD;  Location: [facility] OR;  Service: Orthopedics;  Laterality: Left;   Family History  Problem Relation Age of Onset   Cancer Mother    Arthritis Father    Hypertension Father    Arthritis Sister    Hypertension Brother    Breast cancer Maternal Grandfather    Review of Systems  Objective:  Physical Exam  Vitals:   10/01/22 0906  10/01/22 0909  BP: (!) 140/80 (!) 140/80  Pulse: 79   Temp: 97.8 F (36.6 C)   TempSrc: Oral   SpO2: 98%   Weight: 255 lb (115.7 kg)   Height: 5\' 8"  (1.727 m)     Assessment & Plan:

## 2022-10-01 NOTE — Patient Instructions (Signed)
We have sent in the zepbound 2.5 mg weekly month 1, 5 mg weekly month 2, 7.5 mg weekly month 3.

## 2022-10-01 NOTE — Assessment & Plan Note (Signed)
BP is borderline today and she will continue losartan 50 mg daily she had not taken prior to visit. Checking CMP and CBC and lipid panel and adjust medication as needed.

## 2022-10-01 NOTE — Assessment & Plan Note (Addendum)
Counseled about options and would like to try zepbound. Rx done for 3 months of initial titration and then she will follow up. Counseled about risk/benefit of treatment. BMI 38 and complicated by depression and hypertension.

## 2022-10-01 NOTE — Assessment & Plan Note (Signed)
Checking vitamin D and adjust as needed. Not taking currently.

## 2022-10-01 NOTE — Assessment & Plan Note (Signed)
Counseled about need for weight loss to help. She is doing injections now which help for several months at a time.

## 2022-10-01 NOTE — Assessment & Plan Note (Signed)
Checking HgA1c and adjust as needed. We are addressing her morbid obesity.

## 2022-10-01 NOTE — Assessment & Plan Note (Signed)
Flu shot up to date. Covid-19 counseled. Pneumonia will get next visit. Shingrix complete. Tetanus due 2031. Cologuard due 2025. Mammogram due 2025, pap smear due 2024 and dexa due she will check if done with ob/gyn. Counseled about sun safety and mole surveillance. Counseled about the dangers of distracted driving. Given 10 year screening recommendations.

## 2022-10-01 NOTE — Assessment & Plan Note (Signed)
Recurrent and under control on wellbutrin 450 mg daily. We will continue same dosing and refilled today.

## 2022-10-19 ENCOUNTER — Encounter: Payer: Self-pay | Admitting: Internal Medicine

## 2022-10-22 MED ORDER — METFORMIN HCL ER 500 MG PO TB24: 500.0000 mg | ORAL_TABLET | Freq: Every day | ORAL | 0 refills | Status: DC

## 2022-11-18 DIAGNOSIS — M25562 Pain in left knee: Secondary | ICD-10-CM | POA: Diagnosis not present

## 2022-11-18 DIAGNOSIS — M25561 Pain in right knee: Secondary | ICD-10-CM | POA: Diagnosis not present

## 2022-12-30 ENCOUNTER — Encounter: Payer: Self-pay | Admitting: Internal Medicine

## 2022-12-30 ENCOUNTER — Ambulatory Visit (INDEPENDENT_AMBULATORY_CARE_PROVIDER_SITE_OTHER): Payer: Medicare Other | Admitting: Internal Medicine

## 2022-12-30 NOTE — Assessment & Plan Note (Signed)
Zepbound is helping and down about 10 pounds since taking for 2 months. She is about to start 7.5 mg weekly. We discussed at higher doses more likely to cause poor appetite and less likely to wear off early. She can continue to titrate monthly or stay at a dose she is satisfied with. She will let us know when she needs refills. Follow up 3-6 months.

## 2022-12-30 NOTE — Progress Notes (Signed)
Subjective:   Patient ID: Stephanie Andrade, female    DOB: [birth year], 67 y.o.   MRN: [id number]  HPI The patient is a 67 YO coming in for follow up starting zepbound. Down about 15 pounds. About to start 7.5 mg soon. Some appetite reduction first few days and struggling to eat enough. Wearing off a bit toward the end of the week. Overall happy with this.  Review of Systems  Constitutional: Negative.   HENT: Negative.    Eyes: Negative.   Respiratory:  Negative for cough, chest tightness and shortness of breath.   Cardiovascular:  Negative for chest pain, palpitations and leg swelling.  Gastrointestinal:  Negative for abdominal distention, abdominal pain, constipation, diarrhea, nausea and vomiting.  Musculoskeletal: Negative.   Skin: Negative.   Neurological: Negative.   Psychiatric/Behavioral: Negative.      Objective:  Physical Exam Constitutional:      Appearance: She is well-developed. She is obese.  HENT:     Head: Normocephalic and atraumatic.  Cardiovascular:     Rate and Rhythm: Normal rate and regular rhythm.  Pulmonary:     Effort: Pulmonary effort is normal. No respiratory distress.     Breath sounds: Normal breath sounds. No wheezing or rales.  Abdominal:     General: Bowel sounds are normal. There is no distension.     Palpations: Abdomen is soft.     Tenderness: There is no abdominal tenderness. There is no rebound.  Musculoskeletal:     Cervical back: Normal range of motion.  Skin:    General: Skin is warm and dry.  Neurological:     Mental Status: She is alert and oriented to person, place, and time.     Coordination: Coordination normal.     Vitals:   12/30/22 0855  BP: 120/80  Pulse: 86  Temp: 98.3 F (36.8 C)  TempSrc: Oral  SpO2: 95%  Weight: 243 lb (110.2 kg)  Height: 5\' 8"  (1.727 m)    Assessment & Plan:

## 2023-01-06 ENCOUNTER — Encounter: Payer: Self-pay | Admitting: Internal Medicine

## 2023-01-07 ENCOUNTER — Other Ambulatory Visit: Payer: Self-pay

## 2023-01-07 MED ORDER — ZEPBOUND 7.5 MG/0.5ML ~~LOC~~ SOAJ: 7.5000 mg | SUBCUTANEOUS | 0 refills | Status: DC

## 2023-01-07 NOTE — Telephone Encounter (Addendum)
I have added the pharmacy to patients list.

## 2023-03-01 ENCOUNTER — Other Ambulatory Visit: Payer: Self-pay | Admitting: Internal Medicine

## 2023-03-01 ENCOUNTER — Other Ambulatory Visit (HOSPITAL_BASED_OUTPATIENT_CLINIC_OR_DEPARTMENT_OTHER): Payer: Self-pay

## 2023-03-01 ENCOUNTER — Encounter: Payer: Self-pay | Admitting: Internal Medicine

## 2023-03-01 MED ORDER — ZEPBOUND 7.5 MG/0.5ML ~~LOC~~ SOAJ
7.5000 mg | SUBCUTANEOUS | 0 refills | Status: DC
  Filled 2023-03-01 – 2023-03-08 (×2): qty 2, 28d supply, fill #0

## 2023-03-08 ENCOUNTER — Other Ambulatory Visit (HOSPITAL_COMMUNITY): Payer: Self-pay

## 2023-03-18 ENCOUNTER — Other Ambulatory Visit (HOSPITAL_COMMUNITY): Payer: Self-pay

## 2023-03-18 ENCOUNTER — Ambulatory Visit: Payer: Medicare Other | Admitting: Orthopaedic Surgery

## 2023-03-31 ENCOUNTER — Encounter: Payer: Self-pay | Admitting: Orthopaedic Surgery

## 2023-03-31 ENCOUNTER — Ambulatory Visit (INDEPENDENT_AMBULATORY_CARE_PROVIDER_SITE_OTHER): Payer: Medicare Other | Admitting: Orthopaedic Surgery

## 2023-03-31 VITALS — Ht 68.0 in | Wt 244.0 lb

## 2023-03-31 DIAGNOSIS — M1711 Unilateral primary osteoarthritis, right knee: Secondary | ICD-10-CM

## 2023-03-31 MED ORDER — CYCLOBENZAPRINE HCL 10 MG PO TABS: 10.0000 mg | ORAL_TABLET | Freq: Every day | ORAL | 2 refills | Status: DC

## 2023-03-31 NOTE — Progress Notes (Signed)
HPI: [name] returns today for right knee pain.  We last saw her in 2020 for the right knee she was given a cortisone injection.  The right knee radiographs at that time showed tricompartmental arthritis with near bone-on-bone medial compartment.  Advanced patellofemoral arthritic changes.  Since then she is unfortunately lost her husband.  She has also been working on weight loss and control of her glucose levels.  She reports her hemoglobin A1c to be 5.8 several months ago.  Right knee pain is 8 out of 10 at worst.  She has some painful popping but no other mechanical symptoms.  She states she is unable to stand for long periods of time due to the knee pain.  Also at she had to decrease her exercise where she used to walk 6 miles she can only walk 3 miles because of the knee.  She alternates ibuprofen and Tylenol for the pain.  She is questioning another cortisone injection or other treatment.  She would like to undergo right total knee arthroplasty sometime early February 2025.  Review of systems: Negative for fevers chills.  Please see HPI otherwise negative or noncontributory.  Physical exam: Height 5 foot 7 weight 244 pounds BMI 38.2. General: Well-developed well-nourished female in no acute distress mood and affect appropriate. Bilateral knees: Good range of motion of both knees.  No instability valgus varus stressing no abnormal warmth erythema or effusion.  Significant patellofemoral crepitus bilateral knees.  Tenderness along medial joint line right knee only.  Left knee nontender throughout.    Impression: Right knee osteoarthritis   Plan: This point in time she would like to wait at least another month to time the cortisone injection with the time of surgery.  Therefore we will see her back in 1 month and provided a right knee cortisone injection.  Then we will see her back in December 2 discuss a right total knee arthroplasty to be performed in early February.  Questions were encouraged  and answered at length today.

## 2023-04-26 ENCOUNTER — Encounter: Payer: Self-pay | Admitting: Orthopaedic Surgery

## 2023-04-26 ENCOUNTER — Ambulatory Visit: Payer: Medicare Other | Admitting: Orthopaedic Surgery

## 2023-04-26 ENCOUNTER — Telehealth: Payer: Self-pay

## 2023-04-26 DIAGNOSIS — M25512 Pain in left shoulder: Secondary | ICD-10-CM

## 2023-04-26 DIAGNOSIS — M1711 Unilateral primary osteoarthritis, right knee: Secondary | ICD-10-CM | POA: Diagnosis not present

## 2023-04-26 DIAGNOSIS — G8929 Other chronic pain: Secondary | ICD-10-CM

## 2023-04-26 MED ORDER — METHYLPREDNISOLONE ACETATE 40 MG/ML IJ SUSP
40.0000 mg | INTRAMUSCULAR | Status: AC | PRN
Start: 2023-04-26 — End: 2023-04-26
  Administered 2023-04-26: 40 mg via INTRA_ARTICULAR

## 2023-04-26 MED ORDER — LIDOCAINE HCL 1 % IJ SOLN
3.0000 mL | INTRAMUSCULAR | Status: AC | PRN
  Administered 2023-04-26: 3 mL

## 2023-04-26 MED ORDER — CELECOXIB 200 MG PO CAPS: 200.0000 mg | ORAL_CAPSULE | Freq: Two times a day (BID) | ORAL | 1 refills | Status: AC | PRN

## 2023-04-26 NOTE — Telephone Encounter (Signed)
Right knee gel injection  

## 2023-04-26 NOTE — Progress Notes (Signed)
Patient is well-known to [place].  We have replaced her hips.  She has been dealing with known and well-documented osteoarthritis of her right knee and at some point is considering knee replacement surgery.  She has never had viscosupplementation/hyaluronic acid for the right knee but is tried and failed multiple steroid injections without right knee.  She comes in today with left shoulder pain with no known injury.  It does wake her up at night.  She points to the biceps and the subdeltoid area as a source of her pain on the left shoulder.  She is never had injections or surgery on the left shoulder.   Examination of her left shoulder does show signs of impingement with positive Neer and Hawkins signs.  Range of motion is excellent and the shoulder is well located.  I did recommend a steroid injection in her left shoulder subacromial outlet which she agreed to and tolerated well.  Given the failure conservative treatment for right knee including multiple steroid injections, she would like to try viscosupplementation which I think is reasonable and we will see if we get this ordered for her right knee.  I will send in some Celebrex for inflammation and pain as well.  This patient is diagnosed with osteoarthritis of the knee(s).    Radiographs show evidence of joint space narrowing, osteophytes, subchondral sclerosis and/or subchondral cysts.  This patient has knee pain which interferes with functional and activities of daily living.    This patient has experienced inadequate response, adverse effects and/or intolerance with conservative treatments such as acetaminophen, NSAIDS, topical creams, physical therapy or regular exercise, knee bracing and/or weight loss.   This patient has experienced inadequate response or has a contraindication to intra articular steroid injections for at least 3 months.   This patient is not scheduled to have a total knee replacement within 6 months of starting treatment with  viscosupplementation.     Procedure Note  Patient: Stephanie Andrade             Date of Birth: [birth year]           MRN: [id number]             Visit Date: 04/26/2023  Procedures: Visit Diagnoses:  1. Chronic left shoulder pain   2. Primary osteoarthritis of right knee     Large Joint Inj: L subacromial bursa on 04/26/2023 11:07 AM Indications: pain and diagnostic evaluation Details: 22 G 1.5 in needle  Arthrogram: No  Medications: 3 mL lidocaine 1 %; 40 mg methylPREDNISolone acetate 40 MG/ML Outcome: tolerated well, no immediate complications Procedure, treatment alternatives, risks and benefits explained, specific risks discussed. Consent was given by the patient. Immediately prior to procedure a time out was called to verify the correct patient, procedure, equipment, support staff and site/side marked as required. Patient was prepped and draped in the usual sterile fashion.

## 2023-04-29 NOTE — Telephone Encounter (Signed)
VOB submitted for Orthovisc, right knee

## 2023-05-06 ENCOUNTER — Other Ambulatory Visit: Payer: Self-pay

## 2023-05-06 DIAGNOSIS — M1711 Unilateral primary osteoarthritis, right knee: Secondary | ICD-10-CM

## 2023-05-24 ENCOUNTER — Ambulatory Visit: Payer: Medicare Other | Admitting: Orthopaedic Surgery

## 2023-05-24 ENCOUNTER — Encounter: Payer: Self-pay | Admitting: Orthopaedic Surgery

## 2023-05-24 DIAGNOSIS — M1711 Unilateral primary osteoarthritis, right knee: Secondary | ICD-10-CM | POA: Diagnosis not present

## 2023-05-24 MED ORDER — HYALURONAN 30 MG/2ML IX SOSY
30.0000 mg | PREFILLED_SYRINGE | INTRA_ARTICULAR | Status: AC | PRN
Start: 2023-05-24 — End: 2023-05-24
  Administered 2023-05-24: 30 mg via INTRA_ARTICULAR

## 2023-05-24 NOTE — Progress Notes (Signed)
Procedure Note  Patient: Stephanie Andrade             Date of Birth: 12-15-1955           MRN: [id number]             Visit Date: 05/24/2023  Procedures: Visit Diagnoses:  1. Primary osteoarthritis of right knee     Large Joint Inj: R knee on 05/24/2023 3:51 PM Indications: diagnostic evaluation and pain Details: 22 G 1.5 in needle, superolateral approach  Arthrogram: No  Medications: 30 mg Hyaluronan 30 MG/2ML Outcome: tolerated well, no immediate complications Procedure, treatment alternatives, risks and benefits explained, specific risks discussed. Consent was given by the patient. Immediately prior to procedure a time out was called to verify the correct patient, procedure, equipment, support staff and site/side marked as required. Patient was prepped and draped in the usual sterile fashion.    The patient is here today for Orthovisc No. 1 of a series of 3 hyaluronic acid injections in her right knee to treat the pain from osteoarthritis.  She has tried and failed other conservative treatment measures.  Her right knee shows no effusion today.  I was able to place injection #1 of a series of 3 Orthovisc injections in her knee which she tolerated well.  She is also dealing with continued left shoulder pain.  A subacromial steroid injection did not provide any significant relief.  I would like to send her to my partner Dr. August Saucer for evaluation of her left shoulder with assessing it clinically as well as assessing and under ultrasound and considering a deep intra-articular injection in that left shoulder.  She is someone who is quite claustrophobic and wants to try to avoid an MRI and would like to consider having a deeper injection and an ultrasound assessment of her left shoulder.  We will work on getting that appointment with my partner Dr. August Saucer.  I would like to see her back again next week for injection #2 of the 3 hyaluronic acid injections with Orthovisc.  Lot number:  [id number]

## 2023-05-26 ENCOUNTER — Other Ambulatory Visit (INDEPENDENT_AMBULATORY_CARE_PROVIDER_SITE_OTHER): Payer: Medicare Other

## 2023-05-26 ENCOUNTER — Telehealth: Payer: Self-pay | Admitting: Orthopedic Surgery

## 2023-05-26 ENCOUNTER — Ambulatory Visit: Payer: Medicare Other | Admitting: Orthopedic Surgery

## 2023-05-26 DIAGNOSIS — M25512 Pain in left shoulder: Secondary | ICD-10-CM

## 2023-05-26 NOTE — Telephone Encounter (Signed)
Pt was advised to come in Monday for injections she is already going to be here that morning for Gel injections with Magnus Ivan and was wondering if Wednesday would be better and if it was okay for her to have both injections sop close

## 2023-05-26 NOTE — Telephone Encounter (Signed)
Appt scheduled

## 2023-05-27 ENCOUNTER — Encounter: Payer: Self-pay | Admitting: Orthopedic Surgery

## 2023-05-27 NOTE — Progress Notes (Signed)
Office Visit Note   Patient: Stephanie Andrade           Date of Birth: Mar 28, 1956           MRN: [id number] Visit Date: 05/26/2023 Requested by: Myrlene Broker, MD [address] [place],  [place] [address] PCP: Myrlene Broker, MD  Subjective: Chief Complaint  Patient presents with   Left Shoulder - Pain    HPI: Stephanie Andrade is a 67 y.o. female who presents to the office reporting left shoulder pain of 1 month duration.  Denies any history of injury.  Pain radiates down to the elbow.  Denies any numbness and tingling neck pain or scapular pain.  The pain does wake her from sleep at night.  Denies any neck pain.  Patient states that range of motion is painful.  She has difficulty lifting things due to her shoulder stiffness.  Did have a subacromial injection without relief.  Cannot do an MRI scan.  Pain is worse around that upper humeral shaft region.  Denies any symptoms below the elbow.  Ibuprofen helps very little.  Flexeril does help.  Reports only pain but no weakness.  Hard for her to sleep due to the position of her arm.  Most of her pain occurs when sitting and lying down..                ROS: All systems reviewed are negative as they relate to the chief complaint within the history of present illness.  Patient denies fevers or chills.  Assessment & Plan: Visit Diagnoses:  1. Left shoulder pain, unspecified chronicity     Plan: Impression is left shoulder mild restriction of passive range of motion.  She has a lot going on in the next several days so she would like to come back Monday for ultrasound guided intra-articular injection.  She will follow-up at that time for that injection.  Rotator cuff strength is pretty reasonable today.  Could consider CT arthrogram if she does not get relief from this intra-articular cortisone shot.  Could also look at her rotator cuff with the ultrasound on Monday.  Follow-Up Instructions: No follow-ups on file.   Orders:   Orders Placed This Encounter  Procedures   XR Shoulder Left   No orders of the defined types were placed in this encounter.     Procedures: No procedures performed   Clinical Data: No additional findings.  Objective: Vital Signs: LMP 04/06/2011 (Exact Date)   Physical Exam:  Constitutional: Patient appears well-developed HEENT:  Head: Normocephalic Eyes:EOM are normal Neck: Normal range of motion Cardiovascular: Normal rate Pulmonary/chest: Effort normal Neurologic: Patient is alert Skin: Skin is warm Psychiatric: Patient has normal mood and affect  Ortho Exam: Ortho exam demonstrates range of motion on the right of 60/125/180.  Range of motion on the left 60/100/170.  Rotator cuff strength intact infraspinatus(s) and subscap muscle testing with no AC joint tenderness to direct palpation on the left-hand side.  Cervical spine range of motion intact.  No paresthesias in either upper extremity.  O'Brien's testing equivocal on the left negative on the right.  Mild bicipital groove tenderness.  No coarse grinding or crepitus with internal and external rotation at 90 degrees of abduction.  Specialty Comments:  No specialty comments available.  Imaging: No results found.   PMFS History: Patient Active Problem List   Diagnosis Date Noted   Wheezing 02/18/2022   Routine general medical examination at a health care facility  10/03/2021   ANA positive 09/30/2020   Prediabetes 09/26/2020   Primary osteoarthritis of right knee 11/01/2018   History of vitamin D deficiency 11/15/2011   History of sarcoidosis 11/15/2011   Hot flashes not due to menopause 07/19/2011   Spinal stenosis, lumbar region, with neurogenic claudication 03/10/2011   Hypertension 02/11/2011   Morbid obesity (HCC) 02/11/2011   Depression 02/11/2011   Cervical radiculopathy 12/04/2010   Past Medical History:  Diagnosis Date   Anemia    Arthritis    Asthma    Depression    Family history of adverse  reaction to anesthesia    sister, brother - PONV   Family history of ovarian cancer    Fibroids    GERD (gastroesophageal reflux disease)    occ   History of bronchitis    History of sarcoidosis    Hypertension    Pneumonia    hx   PONV (postoperative nausea and vomiting) 03/26/2016   Wears glasses     Family History  Problem Relation Age of Onset   Cancer Mother    Arthritis Father    Hypertension Father    Arthritis Sister    Hypertension Brother    Breast cancer Maternal Grandfather     Past Surgical History:  Procedure Laterality Date   CHOLECYSTECTOMY N/A 09/22/2016   Procedure: LAPAROSCOPIC CHOLECYSTECTOMY WITH INTRAOPERATIVE CHOLANGIOGRAM;  Surgeon: Harriette Bouillon, MD;  Location: [facility] OR;  Service: General;  Laterality: N/A;   COLONOSCOPY     ESOPHAGOGASTRODUODENOSCOPY     JOINT REPLACEMENT     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   Procedure: RIGHT TOTAL HIP ARTHROPLASTY ANTERIOR APPROACH;  Surgeon: Kathryne Hitch, MD;  Location: [facility] OR;  Service: Orthopedics;  Laterality: Right;   TOTAL HIP ARTHROPLASTY Left 08/24/2017   TOTAL HIP ARTHROPLASTY Left 08/24/2017   Procedure: LEFT TOTAL HIP ARTHROPLASTY ANTERIOR APPROACH;  Surgeon: Kathryne Hitch, MD;  Location: [facility] OR;  Service: Orthopedics;  Laterality: Left;   Social History   Occupational History   Not on file  Tobacco Use   Smoking status: Former    Current packs/day: 0.00    Types: Cigarettes    Quit date: 06/20/2009    Years since quitting: 13.9   Smokeless tobacco: Never   Tobacco comments:    "smoked 2 weeks out of the year for about 15 years"   Vaping Use   Vaping status: Never Used  Substance and Sexual Activity   Alcohol use: Yes    Comment: socially   Drug use: No   Sexual activity: Not on file

## 2023-05-28 DIAGNOSIS — Z0189 Encounter for other specified special examinations: Secondary | ICD-10-CM | POA: Diagnosis not present

## 2023-05-31 ENCOUNTER — Ambulatory Visit: Payer: Medicare Other | Admitting: Orthopaedic Surgery

## 2023-05-31 ENCOUNTER — Encounter: Payer: Self-pay | Admitting: Orthopaedic Surgery

## 2023-05-31 DIAGNOSIS — M1711 Unilateral primary osteoarthritis, right knee: Secondary | ICD-10-CM

## 2023-05-31 MED ORDER — HYALURONAN 30 MG/2ML IX SOSY
30.0000 mg | PREFILLED_SYRINGE | INTRA_ARTICULAR | Status: AC | PRN
Start: 2023-05-31 — End: 2023-05-31
  Administered 2023-05-31: 30 mg via INTRA_ARTICULAR

## 2023-05-31 NOTE — Progress Notes (Signed)
Procedure Note  Patient: Stephanie Andrade             Date of Birth: 1956-08-13           MRN: [id number]             Visit Date: 05/31/2023  Procedures: Visit Diagnoses:  1. Primary osteoarthritis of right knee     Large Joint Inj: R knee on 05/31/2023 9:16 AM Indications: diagnostic evaluation and pain Details: 22 G 1.5 in needle, superolateral approach  Arthrogram: No  Medications: 30 mg Hyaluronan 30 MG/2ML Outcome: tolerated well, no immediate complications Procedure, treatment alternatives, risks and benefits explained, specific risks discussed. Consent was given by the patient. Immediately prior to procedure a time out was called to verify the correct patient, procedure, equipment, support staff and site/side marked as required. Patient was prepped and draped in the usual sterile fashion.    The patient is here today for Orthovisc injection #2 of a series of 3 hyaluronic acid injections in her right knee.  She had good relief from the first injection.  There is no effusion of her right knee.  I did place injection #2 Orthovisc in her right knee.  She tolerated this well.  Will see her back next week for injection #3.  Lot #[id number]

## 2023-06-02 ENCOUNTER — Ambulatory Visit: Payer: Medicare Other | Admitting: Orthopedic Surgery

## 2023-06-02 ENCOUNTER — Encounter: Payer: Self-pay | Admitting: Orthopedic Surgery

## 2023-06-02 ENCOUNTER — Other Ambulatory Visit: Payer: Self-pay

## 2023-06-02 DIAGNOSIS — M65912 Unspecified synovitis and tenosynovitis, left shoulder: Secondary | ICD-10-CM | POA: Diagnosis not present

## 2023-06-02 DIAGNOSIS — M25512 Pain in left shoulder: Secondary | ICD-10-CM | POA: Diagnosis not present

## 2023-06-02 NOTE — Progress Notes (Unsigned)
Office Visit Note   Patient: Stephanie Andrade           Date of Birth: 11/30/1955           MRN: [id number] Visit Date: 06/02/2023 Requested by: Myrlene Broker, MD [address] [place],  [place] [address] PCP: Myrlene Broker, MD  Subjective: Chief Complaint  Patient presents with   Left Shoulder - Follow-up    HPI: Stephanie Andrade is a 67 y.o. female who presents to the office reporting left shoulder pain.  Here for ultrasound evaluation and injection.  She is very claustrophobic and cannot really get an MRI scan.  Reports continued sharp stabbing pain.  Takes ibuprofen and Flexeril at night sometimes.  Hard for her to sleep.  She was able to sleep all night last night..                ROS: All systems reviewed are negative as they relate to the chief complaint within the history of present illness.  Patient denies fevers or chills.  Assessment & Plan: Visit Diagnoses:  1. Left shoulder pain, unspecified chronicity     Plan: Impression is left shoulder pain.  Ultrasound examination shows possible partial-thickness supraspinatus rotator cuff tear.  Does not look like there is anything full-thickness.  Subscap looks intact and the biceps tendon looks okay.  We did inject the left glenohumeral joint today and during the anesthetic portion of that her shoulder pain improved.  I think she has to see how she does with this injection and then let us know if she wants to proceed with further imaging which would be CT arthrogram or MRI arthrogram depending on which when she think she would do better with from a claustrophobia standpoint  Follow-Up Instructions: No follow-ups on file.   Orders:  Orders Placed This Encounter  Procedures   US Guided Needle Placement - No Linked Charges   No orders of the defined types were placed in this encounter.     Procedures: Large Joint Inj: L glenohumeral on 06/02/2023 9:06 AM Indications: diagnostic evaluation and  pain Details: 22 G 1.5 in needle, ultrasound-guided posterior approach  Arthrogram: No  Medications: 9 mL bupivacaine 0.5 %; 40 mg methylPREDNISolone acetate 40 MG/ML; 5 mL lidocaine 1 % Outcome: tolerated well, no immediate complications Procedure, treatment alternatives, risks and benefits explained, specific risks discussed. Consent was given by the patient. Immediately prior to procedure a time out was called to verify the correct patient, procedure, equipment, support staff and site/side marked as required. Patient was prepped and draped in the usual sterile fashion.       Clinical Data: No additional findings.  Objective: Vital Signs: LMP 04/06/2011 (Exact Date)   Physical Exam:  Constitutional: Patient appears well-developed HEENT:  Head: Normocephalic Eyes:EOM are normal Neck: Normal range of motion Cardiovascular: Normal rate Pulmonary/chest: Effort normal Neurologic: Patient is alert Skin: Skin is warm Psychiatric: Patient has normal mood and affect  Ortho Exam: Ortho exam unchanged.  Has pretty good range of motion and strength in that left shoulder with no discrete AC joint tenderness.  The rest of her exam is unchanged from prior visit last week.  Specialty Comments:  No specialty comments available.  Imaging: No results found.   PMFS History: Patient Active Problem List   Diagnosis Date Noted   Wheezing 02/18/2022   Routine general medical examination at a health care facility 10/03/2021   ANA positive 09/30/2020   Prediabetes 09/26/2020  Primary osteoarthritis of right knee 11/01/2018   History of vitamin D deficiency 11/15/2011   History of sarcoidosis 11/15/2011   Hot flashes not due to menopause 07/19/2011   Spinal stenosis, lumbar region, with neurogenic claudication 03/10/2011   Hypertension 02/11/2011   Morbid obesity (HCC) 02/11/2011   Depression 02/11/2011   Cervical radiculopathy 12/04/2010   Past Medical History:  Diagnosis Date    Anemia    Arthritis    Asthma    Depression    Family history of adverse reaction to anesthesia    sister, brother - PONV   Family history of ovarian cancer    Fibroids    GERD (gastroesophageal reflux disease)    occ   History of bronchitis    History of sarcoidosis    Hypertension    Pneumonia    hx   PONV (postoperative nausea and vomiting) 03/26/2016   Wears glasses     Family History  Problem Relation Age of Onset   Cancer Mother    Arthritis Father    Hypertension Father    Arthritis Sister    Hypertension Brother    Breast cancer Maternal Grandfather     Past Surgical History:  Procedure Laterality Date   CHOLECYSTECTOMY N/A 09/22/2016   Procedure: LAPAROSCOPIC CHOLECYSTECTOMY WITH INTRAOPERATIVE CHOLANGIOGRAM;  Surgeon: Harriette Bouillon, MD;  Location: [facility] OR;  Service: General;  Laterality: N/A;   COLONOSCOPY     ESOPHAGOGASTRODUODENOSCOPY     JOINT REPLACEMENT     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   Procedure: RIGHT TOTAL HIP ARTHROPLASTY ANTERIOR APPROACH;  Surgeon: Kathryne Hitch, MD;  Location: [facility] OR;  Service: Orthopedics;  Laterality: Right;   TOTAL HIP ARTHROPLASTY Left 08/24/2017   TOTAL HIP ARTHROPLASTY Left 08/24/2017   Procedure: LEFT TOTAL HIP ARTHROPLASTY ANTERIOR APPROACH;  Surgeon: Kathryne Hitch, MD;  Location: [facility] OR;  Service: Orthopedics;  Laterality: Left;   Social History   Occupational History   Not on file  Tobacco Use   Smoking status: Former    Current packs/day: 0.00    Types: Cigarettes    Quit date: 06/20/2009    Years since quitting: 13.9   Smokeless tobacco: Never   Tobacco comments:    "smoked 2 weeks out of the year for about 15 years"   Vaping Use   Vaping status: Never Used  Substance and Sexual Activity   Alcohol use: Yes    Comment: socially   Drug use: No   Sexual activity: Not on file

## 2023-06-03 MED ORDER — LIDOCAINE HCL 1 % IJ SOLN
5.0000 mL | INTRAMUSCULAR | Status: AC | PRN
Start: 2023-06-02 — End: 2023-06-02
  Administered 2023-06-02: 5 mL

## 2023-06-03 MED ORDER — METHYLPREDNISOLONE ACETATE 40 MG/ML IJ SUSP
40.0000 mg | INTRAMUSCULAR | Status: AC | PRN
Start: 2023-06-02 — End: 2023-06-02
  Administered 2023-06-02: 40 mg via INTRA_ARTICULAR

## 2023-06-03 MED ORDER — BUPIVACAINE HCL 0.5 % IJ SOLN
9.0000 mL | INTRAMUSCULAR | Status: AC | PRN
Start: 2023-06-02 — End: 2023-06-02
  Administered 2023-06-02: 9 mL via INTRA_ARTICULAR

## 2023-06-07 ENCOUNTER — Ambulatory Visit (INDEPENDENT_AMBULATORY_CARE_PROVIDER_SITE_OTHER): Payer: Medicare Other | Admitting: Orthopaedic Surgery

## 2023-06-07 ENCOUNTER — Encounter: Payer: Self-pay | Admitting: Orthopaedic Surgery

## 2023-06-07 DIAGNOSIS — M1711 Unilateral primary osteoarthritis, right knee: Secondary | ICD-10-CM | POA: Diagnosis not present

## 2023-06-07 MED ORDER — HYALURONAN 30 MG/2ML IX SOSY
30.0000 mg | PREFILLED_SYRINGE | INTRA_ARTICULAR | Status: AC | PRN
Start: 2023-06-07 — End: 2023-06-07
  Administered 2023-06-07: 30 mg via INTRA_ARTICULAR

## 2023-06-07 NOTE — Progress Notes (Signed)
Procedure Note  Patient: Stephanie Andrade             Date of Birth: 1956/06/12           MRN: [id number]             Visit Date: 06/07/2023  Procedures: Visit Diagnoses:  1. Primary osteoarthritis of right knee     Large Joint Inj: R knee on 06/07/2023 9:08 AM Indications: diagnostic evaluation and pain Details: 22 G 1.5 in needle, superolateral approach  Arthrogram: No  Medications: 30 mg Hyaluronan 30 MG/2ML Outcome: tolerated well, no immediate complications Procedure, treatment alternatives, risks and benefits explained, specific risks discussed. Consent was given by the patient. Immediately prior to procedure a time out was called to verify the correct patient, procedure, equipment, support staff and site/side marked as required. Patient was prepped and draped in the usual sterile fashion.    The patient comes in today for injection #3 of a series of 3 Orthovisc injections to treat the pain from osteoarthritis in her right knee.  She has had some itching with her right knee but otherwise has not had any adverse reactions.  On exam today her knee shows no effusion.  There is no rash.  I was able to place Orthovisc No. 3 in her knee without difficulty.  All questions and concerns were answered and addressed.  Follow-up can be as needed.  Lot #[id number]

## 2023-09-01 ENCOUNTER — Ambulatory Visit: Payer: Medicare Other | Admitting: Internal Medicine

## 2023-09-01 ENCOUNTER — Telehealth: Payer: Medicare Other | Admitting: Physician Assistant

## 2023-09-01 DIAGNOSIS — H109 Unspecified conjunctivitis: Secondary | ICD-10-CM

## 2023-09-01 MED ORDER — POLYMYXIN B-TRIMETHOPRIM 10000-0.1 UNIT/ML-% OP SOLN
1.0000 [drp] | Freq: Four times a day (QID) | OPHTHALMIC | 0 refills | Status: DC
Start: 2023-09-01 — End: 2024-06-21

## 2023-09-01 NOTE — Progress Notes (Signed)
 Virtual Visit Consent   Stephanie Andrade, you are scheduled for a virtual visit with a [facility] provider today. Just as with appointments in the office, your consent must be obtained to participate. Your consent will be active for this visit and any virtual visit you may have with one of our providers in the next 365 days. If you have a MyChart account, a copy of this consent can be sent to you electronically.  As this is a virtual visit, video technology does not allow for your provider to perform a traditional examination. This may limit your provider's ability to fully assess your condition. If your provider identifies any concerns that need to be evaluated in person or the need to arrange testing (such as labs, EKG, etc.), we will make arrangements to do so. Although advances in technology are sophisticated, we cannot ensure that it will always work on either your end or our end. If the connection with a video visit is poor, the visit may have to be switched to a telephone visit. With either a video or telephone visit, we are not always able to ensure that we have a secure connection.  By engaging in this virtual visit, you consent to the provision of healthcare and authorize for your insurance to be billed (if applicable) for the services provided during this visit. Depending on your insurance coverage, you may receive a charge related to this service.  I need to obtain your verbal consent now. Are you willing to proceed with your visit today? Stephanie Andrade has provided verbal consent on 09/01/2023 for a virtual visit (video or telephone). [name], PA-C  Date: 09/01/2023 10:50 AM  Virtual Visit via Video Note   I, [name], connected with  Stephanie Andrade  ([id number], 20-Feb-1956) on 09/01/23 at 10:45 AM EST by a video-enabled telemedicine application and verified that I am speaking with the correct person using two identifiers.  Location: Patient: Virtual Visit  Location Patient: Home Provider: Virtual Visit Location Provider: Home Office   I discussed the limitations of evaluation and management by telemedicine and the availability of in person appointments. The patient expressed understanding and agreed to proceed.    History of Present Illness: Stephanie Andrade is a 68 y.o. who identifies as a female who was assigned female at birth, and is being seen today for possible pink eye.  HPI: Conjunctivitis  The current episode started yesterday. The onset was sudden. The problem occurs rarely. The problem has been gradually worsening. The problem is mild. Nothing relieves the symptoms. Nothing aggravates the symptoms. Associated symptoms include eye itching, congestion, eye discharge and eye redness. Pertinent negatives include no fever, no decreased vision, no double vision, no photophobia, no headaches and no eye pain. The eye pain is mild. The right eye is affected. The eye pain is not associated with movement. The eyelid exhibits no abnormality.    Problems:  Patient Active Problem List   Diagnosis Date Noted   Wheezing 02/18/2022   Routine general medical examination at a health care facility 10/03/2021   ANA positive 09/30/2020   Prediabetes 09/26/2020   Primary osteoarthritis of right knee 11/01/2018   History of vitamin D  deficiency 11/15/2011   History of sarcoidosis 11/15/2011   Hot flashes not due to menopause 07/19/2011   Spinal stenosis, lumbar region, with neurogenic claudication 03/10/2011   Hypertension 02/11/2011   Morbid obesity (HCC) 02/11/2011   Depression 02/11/2011   Cervical radiculopathy 12/04/2010  Allergies:  Allergies  Allergen Reactions   Mushroom Extract Complex (Do Not Select) Anaphylaxis and Hives   Agave Nausea And Vomiting   Codeine Nausea Only   Dextromethorphan Nausea And Vomiting   Levaquin  [Levofloxacin  In D5w] Other (See Comments)    Myalgias   Macrobid  [Nitrofurantoin Macrocrystal] Nausea And  Vomiting   Macrobid [Nitrofurantoin] Nausea And Vomiting   Tape Itching and Other (See Comments)    Please use paper tape   Tessalon Perles Nausea And Vomiting   Medications:  Current Outpatient Medications:    trimethoprim -polymyxin b  (POLYTRIM ) ophthalmic solution, Place 1 drop into the right eye every 6 (six) hours., Disp: 10 mL, Rfl: 0   azelastine  (ASTELIN ) 0.1 % nasal spray, Place 1 spray into both nostrils 2 (two) times daily. Use in each nostril as directed, Disp: 30 mL, Rfl: 3   buPROPion  (WELLBUTRIN  XL) 150 MG 24 hr tablet, Take 3 tablets (450 mg total) by mouth daily., Disp: 270 tablet, Rfl: 3   celecoxib  (CELEBREX ) 200 MG capsule, Take 1 capsule (200 mg total) by mouth 2 (two) times daily as needed., Disp: 60 capsule, Rfl: 1   Cholecalciferol  (VITAMIN D3) 10000 units TABS, Take 10,000 Units by mouth daily. , Disp: , Rfl:    cyclobenzaprine  (FLEXERIL ) 10 MG tablet, Take 1 tablet (10 mg total) by mouth at bedtime., Disp: 30 tablet, Rfl: 2   diclofenac  sodium (VOLTAREN ) 1 % GEL, Apply 2 g topically 4 (four) times daily., Disp: 100 g, Rfl: 3   docusate sodium  (COLACE) 100 MG capsule, Take 100 mg by mouth daily., Disp: , Rfl:    EPINEPHrine  0.3 mg/0.3 mL IJ SOAJ injection, Inject 0.3 mLs (0.3 mg total) into the muscle as needed for anaphylaxis., Disp: 2 each, Rfl: 1   gabapentin  (NEURONTIN ) 300 MG capsule, Take 1 capsule (300 mg total) by mouth at bedtime., Disp: 90 capsule, Rfl: 3   glucosamine-chondroitin 500-400 MG tablet, Take 1 tablet by mouth 3 (three) times daily., Disp: , Rfl:    losartan  (COZAAR ) 50 MG tablet, Take 1 tablet (50 mg total) by mouth daily., Disp: 90 tablet, Rfl: 3   Multiple Vitamin (MULTI-VITAMIN PO), Take 1 tablet by mouth daily., Disp: , Rfl:    mupirocin  ointment (BACTROBAN ) 2 %, Place 1 application into the nose 2 (two) times daily., Disp: 22 g, Rfl: 0   nitroGLYCERIN  (NITRODUR - DOSED IN MG/24 HR) 0.2 mg/hr patch, Use 1/4 patch daily to the affected area.,  Disp: 30 patch, Rfl: 1   tirzepatide  (ZEPBOUND ) 2.5 MG/0.5ML Pen, Inject 2.5 mg into the skin once a week., Disp: 2 mL, Rfl: 0   tirzepatide  (ZEPBOUND ) 5 MG/0.5ML Pen, Inject 5 mg into the skin once a week., Disp: 2 mL, Rfl: 0   tirzepatide  (ZEPBOUND ) 7.5 MG/0.5ML Pen, Inject 7.5 mg into the skin once a week., Disp: 2 mL, Rfl: 0   zolpidem  (AMBIEN ) 5 MG tablet, Take 1 tablet (5 mg total) by mouth at bedtime as needed. for sleep, Disp: 90 tablet, Rfl: 0  Observations/Objective: Patient is well-developed, well-nourished in no acute distress.  Resting comfortably at home.  Head is normocephalic, atraumatic.  No labored breathing.  Speech is clear and coherent with logical content.  Patient is alert and oriented at baseline.    Assessment and Plan: 1. Bacterial conjunctivitis of right eye (Primary) - trimethoprim -polymyxin b  (POLYTRIM ) ophthalmic solution; Place 1 drop into the right eye every 6 (six) hours.  Dispense: 10 mL; Refill: 0  - Suspect bacterial conjunctivitis -  Polytrim  prescribed - Warm compresses - Good hand hygiene - Seek in person evaluation if symptoms worsen or fail to improve   Follow Up Instructions: I discussed the assessment and treatment plan with the patient. The patient was provided an opportunity to ask questions and all were answered. The patient agreed with the plan and demonstrated an understanding of the instructions.  A copy of instructions were sent to the patient via MyChart unless otherwise noted below.    The patient was advised to call back or seek an in-person evaluation if the symptoms worsen or if the condition fails to improve as anticipated.    [name], PA-C

## 2023-09-01 NOTE — Patient Instructions (Signed)
 Stephanie Andrade, thank you for joining [name], PA-C for today's virtual visit.  While this provider is not your primary care provider (PCP), if your PCP is located in our provider database this encounter information will be shared with them immediately following your visit.   A Stephanie Andrade MyChart account gives you access to today's visit and all your visits, tests, and labs performed at [facility] " click here if you don't have a Stephanie Andrade MyChart account or go to [email or web address]  Consent: (Patient) Stephanie Andrade provided verbal consent for this virtual visit at the beginning of the encounter.  Current Medications:  Current Outpatient Medications:    trimethoprim -polymyxin b  (POLYTRIM ) ophthalmic solution, Place 1 drop into the right eye every 6 (six) hours., Disp: 10 mL, Rfl: 0   azelastine  (ASTELIN ) 0.1 % nasal spray, Place 1 spray into both nostrils 2 (two) times daily. Use in each nostril as directed, Disp: 30 mL, Rfl: 3   buPROPion  (WELLBUTRIN  XL) 150 MG 24 hr tablet, Take 3 tablets (450 mg total) by mouth daily., Disp: 270 tablet, Rfl: 3   celecoxib  (CELEBREX ) 200 MG capsule, Take 1 capsule (200 mg total) by mouth 2 (two) times daily as needed., Disp: 60 capsule, Rfl: 1   Cholecalciferol  (VITAMIN D3) 10000 units TABS, Take 10,000 Units by mouth daily. , Disp: , Rfl:    cyclobenzaprine  (FLEXERIL ) 10 MG tablet, Take 1 tablet (10 mg total) by mouth at bedtime., Disp: 30 tablet, Rfl: 2   diclofenac  sodium (VOLTAREN ) 1 % GEL, Apply 2 g topically 4 (four) times daily., Disp: 100 g, Rfl: 3   docusate sodium  (COLACE) 100 MG capsule, Take 100 mg by mouth daily., Disp: , Rfl:    EPINEPHrine  0.3 mg/0.3 mL IJ SOAJ injection, Inject 0.3 mLs (0.3 mg total) into the muscle as needed for anaphylaxis., Disp: 2 each, Rfl: 1   gabapentin  (NEURONTIN ) 300 MG capsule, Take 1 capsule (300 mg total) by mouth at bedtime., Disp: 90 capsule, Rfl: 3    glucosamine-chondroitin 500-400 MG tablet, Take 1 tablet by mouth 3 (three) times daily., Disp: , Rfl:    losartan  (COZAAR ) 50 MG tablet, Take 1 tablet (50 mg total) by mouth daily., Disp: 90 tablet, Rfl: 3   Multiple Vitamin (MULTI-VITAMIN PO), Take 1 tablet by mouth daily., Disp: , Rfl:    mupirocin  ointment (BACTROBAN ) 2 %, Place 1 application into the nose 2 (two) times daily., Disp: 22 g, Rfl: 0   nitroGLYCERIN  (NITRODUR - DOSED IN MG/24 HR) 0.2 mg/hr patch, Use 1/4 patch daily to the affected area., Disp: 30 patch, Rfl: 1   tirzepatide  (ZEPBOUND ) 2.5 MG/0.5ML Pen, Inject 2.5 mg into the skin once a week., Disp: 2 mL, Rfl: 0   tirzepatide  (ZEPBOUND ) 5 MG/0.5ML Pen, Inject 5 mg into the skin once a week., Disp: 2 mL, Rfl: 0   tirzepatide  (ZEPBOUND ) 7.5 MG/0.5ML Pen, Inject 7.5 mg into the skin once a week., Disp: 2 mL, Rfl: 0   zolpidem  (AMBIEN ) 5 MG tablet, Take 1 tablet (5 mg total) by mouth at bedtime as needed. for sleep, Disp: 90 tablet, Rfl: 0   Medications ordered in this encounter:  Meds ordered this encounter  Medications   trimethoprim -polymyxin b  (POLYTRIM ) ophthalmic solution    Sig: Place 1 drop into the right eye every 6 (six) hours.    Dispense:  10 mL    Refill:  0    Supervising Provider:   [name] [[id number]]     *  If you need refills on other medications prior to your next appointment, please contact your pharmacy*  Follow-Up: Call back or seek an in-person evaluation if the symptoms worsen or if the condition fails to improve as anticipated.  [facility] [phone]  Other Instructions Bacterial Conjunctivitis, Adult Bacterial conjunctivitis is an infection of the clear membrane that covers the white part of the eye and the inner surface of the eyelid (conjunctiva). When the blood vessels in the conjunctiva become inflamed, the eye becomes red or pink. The eye often feels irritated or itchy. Bacterial conjunctivitis spreads easily from  person to person (is contagious). It also spreads easily from one eye to the other eye. What are the causes? This condition is caused by bacteria. You may get the infection if you come into close contact with: A person who is infected with the bacteria. Items that are contaminated with the bacteria, such as a face towel, contact lens solution, or eye makeup. What increases the risk? You are more likely to develop this condition if: You are exposed to other people who have the infection. You wear contact lenses. You have a sinus infection. You have had a recent eye injury or surgery. You have a weak body defense system (immune system). You have a medical condition that causes dry eyes. What are the signs or symptoms? Symptoms of this condition include: Thick, yellowish discharge from the eye. This may turn into a crust on the eyelid overnight and cause your eyelids to stick together. Tearing or watery eyes. Itchy eyes. Burning feeling in your eyes. Eye redness. Swollen eyelids. Blurred vision. How is this diagnosed? This condition is diagnosed based on your symptoms and medical history. Your health care provider may also take a sample of discharge from your eye to find the cause of your infection. How is this treated? This condition may be treated with: Antibiotic eye drops or ointment to clear the infection more quickly and prevent the spread of infection to others. Antibiotic medicines taken by mouth (orally) to treat infections that do not respond to drops or ointments or that last longer than 10 days. Cool, wet cloths (cool compresses) placed on the eyes. Artificial tears applied 2-6 times a day. Follow these instructions at home: Medicines Take or apply your antibiotic medicine as told by your health care provider. Do not stop using the antibiotic, even if your condition improves, unless directed by your health care provider. Take or apply over-the-counter and prescription  medicines only as told by your health care provider. Be very careful to avoid touching the edge of your eyelid with the eye-drop bottle or the ointment tube when you apply medicines to the affected eye. This will keep you from spreading the infection to your other eye or to other people. Managing discomfort Gently wipe away any drainage from your eye with a warm, wet washcloth or a cotton ball. Apply a clean, cool compress to your eye for 10-20 minutes, 3-4 times a day. General instructions Do not wear contact lenses until the inflammation is gone and your health care provider says it is safe to wear them again. Ask your health care provider how to sterilize or replace your contact lenses before you use them again. Wear glasses until you can resume wearing contact lenses. Avoid wearing eye makeup until the inflammation is gone. Throw away any old eye cosmetics that may be contaminated. Change or wash your pillowcase every day. Do not share towels or washcloths. This may spread  the infection. Wash your hands often with soap and water for at least 20 seconds and especially before touching your face or eyes. Use paper towels to dry your hands. Avoid touching or rubbing your eyes. Do not drive or use heavy machinery if your vision is blurred. Contact a health care provider if: You have a fever. Your symptoms do not get better after 10 days. Get help right away if: You have a fever and your symptoms suddenly get worse. You have severe pain when you move your eye. You have facial pain, redness, or swelling. You have a sudden loss of vision. Summary Bacterial conjunctivitis is an infection of the clear membrane that covers the white part of the eye and the inner surface of the eyelid (conjunctiva). Bacterial conjunctivitis spreads easily from eye to eye and from person to person (is contagious). Wash your hands often with soap and water for at least 20 seconds and especially before touching your  face or eyes. Use paper towels to dry your hands. Take or apply your antibiotic medicine as told by your health care provider. Do not stop using the antibiotic even if your condition improves. Contact a health care provider if you have a fever or if your symptoms do not get better after 10 days. Get help right away if you have a sudden loss of vision. This information is not intended to replace advice given to you by your health care provider. Make sure you discuss any questions you have with your health care provider. Document Revised: 11/13/2020 Document Reviewed: 11/13/2020 Elsevier Patient Education  2024 [employer]    If you have been instructed to have an in-person evaluation today at a local Urgent Care facility, please use the link below. It will take you to a list of all of our available [facility], including address, phone number and hours of operation. Please do not delay care.  [facility]  If you or a family member do not have a primary care provider, use the link below to schedule a visit and establish care. When you choose a [facility] primary care physician or advanced practice provider, you gain a long-term partner in health. Find a Primary Care Provider  Learn more about [facility]'s in-office and virtual care options: [facility] - Get Care Now

## 2023-09-20 ENCOUNTER — Other Ambulatory Visit (INDEPENDENT_AMBULATORY_CARE_PROVIDER_SITE_OTHER): Payer: Self-pay

## 2023-09-20 ENCOUNTER — Ambulatory Visit: Payer: Medicare Other | Admitting: Physician Assistant

## 2023-09-20 DIAGNOSIS — S76312A Strain of muscle, fascia and tendon of the posterior muscle group at thigh level, left thigh, initial encounter: Secondary | ICD-10-CM

## 2023-09-20 NOTE — Progress Notes (Signed)
HPI: Mrs. Zale comes in today for left buttocks pain.  She was going some rowing luggage to weeks ago when she twisted and had intense pain under her right buttocks region.  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  She denies any groin pain.  No numbness.  No radicular symptoms down the leg.  She has tried Tylenol ibuprofen and Flexeril.  She states Tylenol and ibuprofen helps some.  But Flexeril really she could not tell a difference with.  Symptoms are somewhat improved.  Review of systems: See HPI otherwise negative  Physical exam: General Well-developed well-nourished female no acute distress able to get on and off the exam table on her own.  Uses no assistive device to ambulate. Bilateral hips excellent range of motion without pain.  She has tenderness over the left ischial tuberosity region.  Provocative maneuvers of the left hamstring: Some pain.  She is able to fully flex the left lower leg.  Radiographs: AP pelvis lateral view left hip: Shows bilateral total hip arthroplasty components to be well-seated no signs of any hardware failure or complication.  Both hips are well located.  No acute fractures.  Sclerotic changes left ischial region are unchanged from prior films.  Impression: Left hamstring strain  Plan: Recommend relative rest.  Continue ibuprofen Tylenol, ice heat as needed.  Pain persist or comes worse she will follow-up with [place].  Questions were encouraged and answered at length.

## 2023-10-01 ENCOUNTER — Ambulatory Visit (INDEPENDENT_AMBULATORY_CARE_PROVIDER_SITE_OTHER): Payer: Medicare Other

## 2023-10-01 VITALS — BP 138/80 | HR 72 | Ht 67.0 in | Wt 223.4 lb

## 2023-10-01 DIAGNOSIS — Z0000 Encounter for general adult medical examination without abnormal findings: Secondary | ICD-10-CM | POA: Diagnosis not present

## 2023-10-01 DIAGNOSIS — Z1211 Encounter for screening for malignant neoplasm of colon: Secondary | ICD-10-CM

## 2023-10-01 NOTE — Patient Instructions (Addendum)
Stephanie Andrade , Thank you for taking time to come for your Medicare Wellness Visit. I appreciate your ongoing commitment to your health goals. Please review the following plan we discussed and let me know if I can assist you in the future.   Referrals/Orders/Follow-Ups/Clinician Recommendations: Aim for 30 minutes of exercise or brisk walking, 6-8 glasses of water, and 5 servings of fruits and vegetables each day.   This is a list of the screening recommended for you and due dates:  Health Maintenance  Topic Date Due   Pneumonia Vaccine (3 of 3 - PPSV23 or PCV20) 12/29/2022   Flu Shot  03/18/2023   COVID-19 Vaccine (4 - 2024-25 season) 04/18/2023   Mammogram  12/30/2023*   Cologuard (Stool DNA test)  03/18/2024   Medicare Annual Wellness Visit  09/30/2024   DTaP/Tdap/Td vaccine (3 - Td or Tdap) 03/20/2030   DEXA scan (bone density measurement)  Completed   Hepatitis C Screening  Completed   Zoster (Shingles) Vaccine  Completed   HPV Vaccine  Aged Out   Colon Cancer Screening  Discontinued  *Topic was postponed. The date shown is not the original due date.    Advanced directives: (In Chart) A copy of your advanced directives are scanned into your chart should your provider ever need it.  Next Medicare Annual Wellness Visit scheduled for next year: Yes - 09/2024

## 2023-10-01 NOTE — Progress Notes (Addendum)
Subjective:   Stephanie Andrade is a 68 y.o. female who presents for Medicare Annual (Subsequent) preventive examination.  Visit Complete: In person  Patient Medicare AWV questionnaire was completed by the patient on 09/27/2023; I have confirmed that all information answered by patient is correct and no changes since this date.  Cardiac Risk Factors include: advanced age (>76men, >37 women);hypertension;obesity (BMI >30kg/m2)     Objective:    Today's Vitals   10/01/23 1358  BP: 138/80  Pulse: 72  SpO2: 98%  Weight: 223 lb 6.4 oz (101.3 kg)  Height: 5\' 7"  (1.702 m)   Body mass index is 34.99 kg/m.     10/01/2023    1:57 PM 08/25/2017    8:44 AM 08/24/2017   12:25 PM 08/13/2017    9:17 AM 09/20/2016    1:34 PM 04/01/2016    1:38 PM 03/26/2016    8:10 PM  Advanced Directives  Does Patient Have a Medical Advance Directive? Yes  Yes Yes No Yes Yes  Type of Estate agent of [place];Living will Healthcare Power of [place];Living will Living will;Healthcare Power of [employer] Power of [place];Living will  Living will;Healthcare Power of [employer] Power of [place];Living will  Does patient want to make changes to medical advance directive? No - Patient declined No - Patient declined  No - Patient declined   No - Patient declined  Copy of Healthcare Power of Attorney in Chart? Yes - validated most recent copy scanned in chart (See row information) Yes No - copy requested No - copy requested  No - copy requested Yes    Current Medications (verified) Outpatient Encounter Medications as of 10/01/2023  Medication Sig   azelastine (ASTELIN) 0.1 % nasal spray Place 1 spray into both nostrils 2 (two) times daily. Use in each nostril as directed   buPROPion (WELLBUTRIN XL) 150 MG 24 hr tablet Take 3 tablets (450 mg total) by mouth daily.   celecoxib (CELEBREX) 200 MG capsule Take 1 capsule (200 mg total) by mouth 2 (two) times daily as needed.    Cholecalciferol (VITAMIN D3) 10000 units TABS Take 10,000 Units by mouth daily.    cyclobenzaprine (FLEXERIL) 10 MG tablet Take 1 tablet (10 mg total) by mouth at bedtime.   diclofenac sodium (VOLTAREN) 1 % GEL Apply 2 g topically 4 (four) times daily.   docusate sodium (COLACE) 100 MG capsule Take 100 mg by mouth daily.   EPINEPHrine 0.3 mg/0.3 mL IJ SOAJ injection Inject 0.3 mLs (0.3 mg total) into the muscle as needed for anaphylaxis.   gabapentin (NEURONTIN) 300 MG capsule Take 1 capsule (300 mg total) by mouth at bedtime.   glucosamine-chondroitin 500-400 MG tablet Take 1 tablet by mouth 3 (three) times daily.   losartan (COZAAR) 50 MG tablet Take 1 tablet (50 mg total) by mouth daily.   Multiple Vitamin (MULTI-VITAMIN PO) Take 1 tablet by mouth daily.   mupirocin ointment (BACTROBAN) 2 % Place 1 application into the nose 2 (two) times daily.   nitroGLYCERIN (NITRODUR - DOSED IN MG/24 HR) 0.2 mg/hr patch Use 1/4 patch daily to the affected area.   tirzepatide (ZEPBOUND) 7.5 MG/0.5ML Pen Inject 7.5 mg into the skin once a week.   trimethoprim-polymyxin b (POLYTRIM) ophthalmic solution Place 1 drop into the right eye every 6 (six) hours.   zolpidem (AMBIEN) 5 MG tablet Take 1 tablet (5 mg total) by mouth at bedtime as needed. for sleep   No facility-administered encounter medications on file  as of 10/01/2023.    Allergies (verified) Mushroom extract complex (obsolete), Agave, Codeine, Dextromethorphan, Levaquin [levofloxacin in d5w], Macrobid  [nitrofurantoin macrocrystal], Macrobid [nitrofurantoin], Tape, and Tessalon perles   History: Past Medical History:  Diagnosis Date   Anemia    Arthritis    Asthma    Depression    Family history of adverse reaction to anesthesia    sister, brother - PONV   Family history of ovarian cancer    Fibroids    GERD (gastroesophageal reflux disease)    occ   History of bronchitis    History of sarcoidosis    Hypertension    Pneumonia    hx    PONV (postoperative nausea and vomiting) 03/26/2016   Wears glasses    Past Surgical History:  Procedure Laterality Date   CHOLECYSTECTOMY N/A 09/22/2016   Procedure: LAPAROSCOPIC CHOLECYSTECTOMY WITH INTRAOPERATIVE CHOLANGIOGRAM;  Surgeon: Harriette Bouillon, MD;  Location: [facility] OR;  Service: General;  Laterality: N/A;   COLONOSCOPY     ESOPHAGOGASTRODUODENOSCOPY     JOINT REPLACEMENT     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   Procedure: RIGHT TOTAL HIP ARTHROPLASTY ANTERIOR APPROACH;  Surgeon: Kathryne Hitch, MD;  Location: [facility] OR;  Service: Orthopedics;  Laterality: Right;   TOTAL HIP ARTHROPLASTY Left 08/24/2017   TOTAL HIP ARTHROPLASTY Left 08/24/2017   Procedure: LEFT TOTAL HIP ARTHROPLASTY ANTERIOR APPROACH;  Surgeon: Kathryne Hitch, MD;  Location: [facility] OR;  Service: Orthopedics;  Laterality: Left;   Family History  Problem Relation Age of Onset   Cancer Mother    Arthritis Father    Hypertension Father    Arthritis Sister    Hypertension Brother    Breast cancer Maternal Grandfather    Social History   Socioeconomic History   Marital status: Widowed    Spouse name: Not on file   Number of children: Not on file   Years of education: Not on file   Highest education level: Bachelor's degree (e.g., BA, AB, BS)  Occupational History   Not on file  Tobacco Use   Smoking status: Former    Current packs/day: 0.00    Types: Cigarettes    Quit date: 06/20/2009    Years since quitting: 14.2   Smokeless tobacco: Never   Tobacco comments:    "smoked 2 weeks out of the year for about 15 years"   Vaping Use   Vaping status: Never Used  Substance and Sexual Activity   Alcohol use: Yes    Alcohol/week: 1.0 standard drink of alcohol    Types: 1 Glasses of wine per week    Comment: socially   Drug use: No   Sexual activity: Not on file  Other Topics Concern   Not on file  Social History Narrative   Not on file   Social Drivers of Health   Financial Resource  Strain: Low Risk  (10/01/2023)   Overall Financial Resource Strain (CARDIA)    Difficulty of Paying Living Expenses: Not hard at all  Food Insecurity: No Food Insecurity (10/01/2023)   Hunger Vital Sign    Worried About Running Out of Food in the Last Year: Never true    Ran Out of Food in the Last Year: Never true  Transportation Needs: No Transportation Needs (10/01/2023)   PRAPARE - Administrator, Civil Service (Medical): No    Lack of Transportation (Non-Medical): No  Physical Activity: Sufficiently Active (10/01/2023)   Exercise Vital Sign    Days of Exercise  per Week: 7 days    Minutes of Exercise per Session: 60 min  Stress: No Stress Concern Present (10/01/2023)   [employer] of Occupational Health - Occupational Stress Questionnaire    Feeling of Stress : Not at all  Social Connections: Socially Isolated (10/01/2023)   Social Connection and Isolation Panel [NHANES]    Frequency of Communication with Friends and Family: More than three times a week    Frequency of Social Gatherings with Friends and Family: More than three times a week    Attends Religious Services: Never    Database administrator or Organizations: No    Attends Banker Meetings: Never    Marital Status: Widowed    Tobacco Counseling Counseling given: Not Answered Tobacco comments: "smoked 2 weeks out of the year for about 15 years"    Clinical Intake:     Pain : No/denies pain     BMI - recorded: 34.99 Nutritional Status: BMI > 30  Obese Nutritional Risks: None Diabetes: No  How often do you need to have someone help you when you read instructions, pamphlets, or other written materials from your doctor or pharmacy?: 1 - Never  Interpreter Needed?: No  Information entered by :: Hassell Halim, CMA   Activities of Daily Living    10/01/2023    2:01 PM 09/27/2023   12:01 PM  In your present state of health, do you have any difficulty performing the following  activities:  Hearing? 0 0  Vision? 0 0  Difficulty concentrating or making decisions? 0 0  Dressing or bathing? 0 0  Doing errands, shopping? 0 0  Preparing Food and eating ? N N  Using the Toilet? N N  In the past six months, have you accidently leaked urine? Y Y  Comment wears a pad - no referral to Urologist needed   Do you have problems with loss of bowel control? N N  Managing your Medications? N N  Managing your Finances? N N  Housekeeping or managing your Housekeeping? N N    Patient Care Team: Myrlene Broker, MD as PCP - General (Internal Medicine)  Indicate any recent Medical Services you may have received from other than [facility] providers in the past year (date may be approximate).     Assessment:   This is a routine wellness examination for [place].  Hearing/Vision screen Hearing Screening - Comments:: Denies hearing difficulties   Vision Screening - Comments:: Wears eyeglasses only for reading - up to date with routine eye exams with Dr Randon Goldsmith   Goals Addressed               This Visit's Progress     Weight (lb) < 200 lb (90.7 kg) (pt-stated)   223 lb 6.4 oz (101.3 kg)     Patient stated she'd like to lose weight (50lbs).       Depression Screen    12/30/2022    9:01 AM 10/01/2022    9:10 AM 02/18/2022   10:01 AM 09/30/2021    1:23 PM 09/26/2020    9:21 AM 03/20/2020   10:13 AM 04/03/2019    9:22 AM  PHQ 2/9 Scores  PHQ - 2 Score 0 0 0 1 2 3 2   PHQ- 9 Score 0 0 1 1 10 7 10     Fall Risk    10/01/2023    2:03 PM 09/27/2023   12:01 PM 10/01/2022    9:10 AM 02/18/2022   10:01 AM 09/26/2020  9:10 AM  Fall Risk   Falls in the past year? 0 0 0 0 0  Number falls in past yr: 0  0    Injury with Fall? 0  0 0   Risk for fall due to : No Fall Risks    No Fall Risks  Follow up Falls prevention discussed;Falls evaluation completed  Falls evaluation completed      MEDICARE RISK AT HOME: Medicare Risk at Home Any stairs in or around the home?: Yes If so,  are there any without handrails?: Yes Home free of loose throw rugs in walkways, pet beds, electrical cords, etc?: Yes Adequate lighting in your home to reduce risk of falls?: Yes Life alert?: No Use of a cane, walker or w/c?: No Grab bars in the bathroom?: No Shower chair or bench in shower?: No Elevated toilet seat or a handicapped toilet?: No  TIMED UP AND GO:  Was the test performed?  No    Cognitive Function:        10/01/2023    2:03 PM  6CIT Screen  What Year? 0 points  What month? 0 points  What time? 0 points  Count back from 20 0 points  Months in reverse 0 points  Repeat phrase 0 points  Total Score 0 points    Immunizations Immunization History  Administered Date(s) Administered   Influenza Whole 04/30/2019   Influenza,inj,Quad PF,6+ Mos 05/28/2020, 05/21/2021   PFIZER(Purple Top)SARS-COV-2 Vaccination 09/06/2019, 09/26/2019, 04/30/2020   Pneumococcal Conjugate-13 12/28/2017   Pneumococcal Polysaccharide-23 04/25/2012   Tdap 09/20/2009, 03/20/2020   Zoster Recombinant(Shingrix) 09/30/2021, 12/31/2021    TDAP status: Up to date - 03/20/2020  Flu Vaccine status: Declined, Education has been provided regarding the importance of this vaccine but patient still declined. Advised may receive this vaccine at local pharmacy or Health Dept. Aware to provide a copy of the vaccination record if obtained from local pharmacy or Health Dept. Verbalized acceptance and understanding.  Pneumococcal vaccine status: Declined,  Education has been provided regarding the importance of this vaccine but patient still declined. Advised may receive this vaccine at local pharmacy or Health Dept. Aware to provide a copy of the vaccination record if obtained from local pharmacy or Health Dept. Verbalized acceptance and understanding.   Covid-19 vaccine status: Declined, Education has been provided regarding the importance of this vaccine but patient still declined. Advised may receive this  vaccine at local pharmacy or Health Dept.or vaccine clinic. Aware to provide a copy of the vaccination record if obtained from local pharmacy or Health Dept. Verbalized acceptance and understanding.  Qualifies for Shingles Vaccine? Yes   Zostavax completed Yes   Shingrix Completed?: Yes  Screening Tests Health Maintenance  Topic Date Due   Pneumonia Vaccine 66+ Years old (3 of 3 - PPSV23 or PCV20) 12/29/2022   INFLUENZA VACCINE  03/18/2023   COVID-19 Vaccine (4 - 2024-25 season) 04/18/2023   MAMMOGRAM  12/30/2023 (Originally 01/15/2022)   Fecal DNA (Cologuard)  03/18/2024   Medicare Annual Wellness (AWV)  09/30/2024   DTaP/Tdap/Td (3 - Td or Tdap) 03/20/2030   DEXA SCAN  Completed   Hepatitis C Screening  Completed   Zoster Vaccines- Shingrix  Completed   HPV VACCINES  Aged Out   Colonoscopy  Discontinued    Health Maintenance  Health Maintenance Due  Topic Date Due   Pneumonia Vaccine 62+ Years old (3 of 3 - PPSV23 or PCV20) 12/29/2022   INFLUENZA VACCINE  03/18/2023   COVID-19 Vaccine (4 -  2024-25 season) 04/18/2023    Colorectal cancer screening: Type of screening: Cologuard. Completed 03/18/2021. Repeat every 3 years  Mammogram Status:Due - Gynecologist will schedule at their office in 2025.  Bone Density status: Completed 07/03/2013. Results reflect: Bone density results: OSTEOPENIA. Repeat every 5 years.   Additional Screening:  Hepatitis C Screening: does qualify; Completed 01/06/2019  Vision Screening: Recommended annual ophthalmology exams for early detection of glaucoma and other disorders of the eye. Is the patient up to date with their annual eye exam?  Yes  Who is the provider or what is the name of the office in which the patient attends annual eye exams? Dr. Randon Goldsmith If pt is not established with a provider, would they like to be referred to a provider to establish care? No .   Dental Screening: Recommended annual dental exams for proper oral  hygiene  Community Resource Referral / Chronic Care Management: CRR required this visit?  No   CCM required this visit?  No     Plan:     I have personally reviewed and noted the following in the patient's chart:   Medical and social history Use of alcohol, tobacco or illicit drugs  Current medications and supplements including opioid prescriptions. Patient is not currently taking opioid prescriptions. Functional ability and status Nutritional status Physical activity Advanced directives List of other physicians Hospitalizations, surgeries, and ER visits in previous 12 months Vitals Screenings to include cognitive, depression, and falls Referrals and appointments  In addition, I have reviewed and discussed with patient certain preventive protocols, quality metrics, and best practice recommendations. A written personalized care plan for preventive services as well as general preventive health recommendations were provided to patient.     Darreld Mclean, CMA   10/01/2023   After Visit Summary: (MyChart) Due to this being a telephonic visit, the after visit summary with patients personalized plan was offered to patient via MyChart   Nurse Notes: Placed an order for a repeat Cologuard.

## 2023-10-08 ENCOUNTER — Encounter: Payer: Medicare Other | Admitting: Internal Medicine

## 2023-10-12 ENCOUNTER — Encounter: Payer: Self-pay | Admitting: Internal Medicine

## 2023-10-12 ENCOUNTER — Ambulatory Visit (INDEPENDENT_AMBULATORY_CARE_PROVIDER_SITE_OTHER): Payer: Medicare Other | Admitting: Internal Medicine

## 2023-10-12 ENCOUNTER — Telehealth: Payer: Self-pay

## 2023-10-12 VITALS — BP 122/84 | HR 103 | Temp 97.9°F | Ht 67.0 in | Wt 224.0 lb

## 2023-10-12 DIAGNOSIS — R202 Paresthesia of skin: Secondary | ICD-10-CM | POA: Diagnosis not present

## 2023-10-12 DIAGNOSIS — R7303 Prediabetes: Secondary | ICD-10-CM

## 2023-10-12 DIAGNOSIS — Z8639 Personal history of other endocrine, nutritional and metabolic disease: Secondary | ICD-10-CM | POA: Diagnosis not present

## 2023-10-12 DIAGNOSIS — Z6835 Body mass index (BMI) 35.0-35.9, adult: Secondary | ICD-10-CM

## 2023-10-12 DIAGNOSIS — Z23 Encounter for immunization: Secondary | ICD-10-CM | POA: Diagnosis not present

## 2023-10-12 DIAGNOSIS — I10 Essential (primary) hypertension: Secondary | ICD-10-CM | POA: Diagnosis not present

## 2023-10-12 DIAGNOSIS — Z0000 Encounter for general adult medical examination without abnormal findings: Secondary | ICD-10-CM

## 2023-10-12 DIAGNOSIS — F3341 Major depressive disorder, recurrent, in partial remission: Secondary | ICD-10-CM

## 2023-10-12 LAB — CBC
HCT: 40.2 % (ref 36.0–46.0)
Hemoglobin: 13.2 g/dL (ref 12.0–15.0)
MCHC: 32.9 g/dL (ref 30.0–36.0)
MCV: 83.5 fL (ref 78.0–100.0)
Platelets: 272 10*3/uL (ref 150.0–400.0)
RBC: 4.82 Mil/uL (ref 3.87–5.11)
RDW: 13.7 % (ref 11.5–15.5)
WBC: 7.9 10*3/uL (ref 4.0–10.5)

## 2023-10-12 LAB — COMPREHENSIVE METABOLIC PANEL
ALT: 9 U/L (ref 0–35)
AST: 13 U/L (ref 0–37)
Albumin: 4.4 g/dL (ref 3.5–5.2)
Alkaline Phosphatase: 81 U/L (ref 39–117)
BUN: 23 mg/dL (ref 6–23)
CO2: 29 meq/L (ref 19–32)
Calcium: 9.5 mg/dL (ref 8.4–10.5)
Chloride: 102 meq/L (ref 96–112)
Creatinine, Ser: 0.87 mg/dL (ref 0.40–1.20)
GFR: 69.31 mL/min (ref >60.00)
Glucose, Bld: 75 mg/dL (ref 70–99)
Potassium: 4.6 meq/L (ref 3.5–5.1)
Sodium: 140 meq/L (ref 135–145)
Total Bilirubin: 0.4 mg/dL (ref 0.2–1.2)
Total Protein: 7.6 g/dL (ref 6.0–8.3)

## 2023-10-12 LAB — LIPID PANEL
Cholesterol: 166 mg/dL (ref 0–200)
HDL: 82.2 mg/dL (ref >39.00)
LDL Cholesterol: 69 mg/dL (ref 0–99)
NonHDL: 83.84
Total CHOL/HDL Ratio: 2
Triglycerides: 74 mg/dL (ref 0.0–149.0)
VLDL: 14.8 mg/dL (ref 0.0–40.0)

## 2023-10-12 LAB — VITAMIN B12: Vitamin B-12: 204 pg/mL — ABNORMAL LOW (ref 211–911)

## 2023-10-12 LAB — TSH: TSH: 0.76 u[IU]/mL (ref 0.35–5.50)

## 2023-10-12 LAB — VITAMIN D 25 HYDROXY (VIT D DEFICIENCY, FRACTURES): VITD: >120 ng/mL

## 2023-10-12 LAB — HEMOGLOBIN A1C: Hgb A1c MFr Bld: 5.3 % (ref 4.6–6.5)

## 2023-10-12 MED ORDER — BUPROPION HCL ER (XL) 150 MG PO TB24: 450.0000 mg | ORAL_TABLET | Freq: Every day | ORAL | 3 refills | Status: AC

## 2023-10-12 MED ORDER — EPINEPHRINE 0.3 MG/0.3ML IJ SOAJ: 0.3000 mg | INTRAMUSCULAR | 1 refills | Status: AC | PRN

## 2023-10-12 NOTE — Assessment & Plan Note (Signed)
 Overall slight worsening in the last year. Will increase wellbutrin from 300 mg daily to 450 mg daily.

## 2023-10-12 NOTE — Assessment & Plan Note (Signed)
Checking vitamin D level and adjust as needed.  

## 2023-10-12 NOTE — Assessment & Plan Note (Signed)
 BP at goal on losartan 50 mg daily checking CMP and adjust as needed.

## 2023-10-12 NOTE — Progress Notes (Signed)
   Subjective:   Patient ID: Stephanie Andrade, female    DOB: [birth year], 68 y.o.   MRN: [id number]  HPI The patient is here for physical.  PMH, [facility], social history reviewed and updated  Review of Systems  Constitutional: Negative.   HENT: Negative.    Eyes: Negative.   Respiratory:  Negative for cough, chest tightness and shortness of breath.   Cardiovascular:  Negative for chest pain, palpitations and leg swelling.  Gastrointestinal:  Negative for abdominal distention, abdominal pain, constipation, diarrhea, nausea and vomiting.  Musculoskeletal: Negative.   Skin: Negative.   Neurological: Negative.   Psychiatric/Behavioral: Negative.      Objective:  Physical Exam Constitutional:      Appearance: She is well-developed.  HENT:     Head: Normocephalic and atraumatic.  Cardiovascular:     Rate and Rhythm: Normal rate and regular rhythm.  Pulmonary:     Effort: Pulmonary effort is normal. No respiratory distress.     Breath sounds: Normal breath sounds. No wheezing or rales.  Abdominal:     General: Bowel sounds are normal. There is no distension.     Palpations: Abdomen is soft.     Tenderness: There is no abdominal tenderness. There is no rebound.  Musculoskeletal:     Cervical back: Normal range of motion.  Skin:    General: Skin is warm and dry.  Neurological:     Mental Status: She is alert and oriented to person, place, and time.     Coordination: Coordination normal.     Vitals:   10/12/23 0905  BP: 122/84  Pulse: (!) 103  Temp: 97.9 F (36.6 C)  TempSrc: Oral  SpO2: 98%  Weight: 224 lb (101.6 kg)  Height: 5\' 7"  (1.702 m)    Assessment & Plan:  Flu and prevnar 20 given at visit

## 2023-10-12 NOTE — Assessment & Plan Note (Signed)
 Flu shot given. Pneumonia given 20. Shingrix complete. Tetanus up to date. Cologuard due later this year. Mammogram up to date, pap smear aged out and dexa up to date. Counseled about sun safety and mole surveillance. Counseled about the dangers of distracted driving. Given 10 year screening recommendations.

## 2023-10-12 NOTE — Telephone Encounter (Signed)
 Addressed via result note

## 2023-10-12 NOTE — Assessment & Plan Note (Signed)
 Checking HGA1c and adjust as needed.

## 2023-10-12 NOTE — Patient Instructions (Addendum)
 Try adding some pelvic floor exercises to help the bladder.

## 2023-10-12 NOTE — Assessment & Plan Note (Signed)
 Weight is decreased and she has had best success with zepbound. She is going back to weight watchers. BMI 35 and complicated by hypertension and pre-diabetes.

## 2023-10-14 DIAGNOSIS — Z780 Asymptomatic menopausal state: Secondary | ICD-10-CM | POA: Diagnosis not present

## 2023-10-14 DIAGNOSIS — Z1231 Encounter for screening mammogram for malignant neoplasm of breast: Secondary | ICD-10-CM | POA: Diagnosis not present

## 2023-10-14 DIAGNOSIS — Z01419 Encounter for gynecological examination (general) (routine) without abnormal findings: Secondary | ICD-10-CM | POA: Diagnosis not present

## 2023-10-26 ENCOUNTER — Other Ambulatory Visit: Payer: Self-pay

## 2023-10-26 MED ORDER — ZEPBOUND 7.5 MG/0.5ML ~~LOC~~ SOAJ: 7.5000 mg | SUBCUTANEOUS | 3 refills | Status: DC

## 2023-10-26 NOTE — Addendum Note (Signed)
 Addended by: Hillard Danker A on: 10/26/2023 11:28 AM   Modules accepted: Orders

## 2023-11-11 ENCOUNTER — Other Ambulatory Visit: Payer: Self-pay

## 2023-11-11 MED ORDER — ZEPBOUND 7.5 MG/0.5ML ~~LOC~~ SOAJ: 7.5000 mg | SUBCUTANEOUS | 3 refills | Status: DC

## 2023-11-11 NOTE — Telephone Encounter (Signed)
 I have sent this in

## 2023-11-17 MED ORDER — TIRZEPATIDE-WEIGHT MANAGEMENT 7.5 MG/0.5ML ~~LOC~~ SOLN: 7.5000 mg | SUBCUTANEOUS | 0 refills | Status: DC

## 2023-11-17 NOTE — Addendum Note (Signed)
 Addended by: Levonne Lapping on: 11/17/2023 12:00 PM   Modules accepted: Orders

## 2023-11-28 ENCOUNTER — Encounter: Payer: Self-pay | Admitting: Internal Medicine

## 2023-11-29 ENCOUNTER — Other Ambulatory Visit: Payer: Self-pay

## 2023-11-29 MED ORDER — LOSARTAN POTASSIUM 50 MG PO TABS: 50.0000 mg | ORAL_TABLET | Freq: Every day | ORAL | 3 refills | Status: DC

## 2023-11-29 NOTE — Telephone Encounter (Signed)
 If appropriate this has been added to patient pharmacy list

## 2023-12-09 ENCOUNTER — Other Ambulatory Visit: Payer: Self-pay | Admitting: Internal Medicine

## 2023-12-13 ENCOUNTER — Encounter: Payer: Self-pay | Admitting: Internal Medicine

## 2023-12-14 MED ORDER — ZEPBOUND 10 MG/0.5ML ~~LOC~~ SOAJ: 10.0000 mg | SUBCUTANEOUS | 0 refills | Status: DC

## 2023-12-31 ENCOUNTER — Ambulatory Visit: Admitting: Internal Medicine

## 2023-12-31 ENCOUNTER — Ambulatory Visit (INDEPENDENT_AMBULATORY_CARE_PROVIDER_SITE_OTHER)

## 2023-12-31 DIAGNOSIS — E538 Deficiency of other specified B group vitamins: Secondary | ICD-10-CM | POA: Diagnosis not present

## 2023-12-31 MED ORDER — CYANOCOBALAMIN 1000 MCG/ML IJ SOLN
1000.0000 ug | Freq: Once | INTRAMUSCULAR | Status: AC
  Administered 2023-12-31: 1000 ug via INTRAMUSCULAR

## 2023-12-31 NOTE — Progress Notes (Signed)
Pt was given B12 injection with no complications.

## 2024-01-05 NOTE — Telephone Encounter (Signed)
 So they just need us  to resend it in again

## 2024-01-07 ENCOUNTER — Other Ambulatory Visit: Payer: Self-pay

## 2024-01-07 ENCOUNTER — Ambulatory Visit: Admitting: Internal Medicine

## 2024-01-07 MED ORDER — ZEPBOUND 10 MG/0.5ML ~~LOC~~ SOAJ: 10.0000 mg | SUBCUTANEOUS | 0 refills | Status: DC

## 2024-01-14 ENCOUNTER — Ambulatory Visit (INDEPENDENT_AMBULATORY_CARE_PROVIDER_SITE_OTHER): Admitting: Radiology

## 2024-01-14 ENCOUNTER — Ambulatory Visit: Admitting: Internal Medicine

## 2024-01-14 DIAGNOSIS — E538 Deficiency of other specified B group vitamins: Secondary | ICD-10-CM | POA: Diagnosis not present

## 2024-01-14 MED ORDER — CYANOCOBALAMIN 1000 MCG/ML IJ SOLN
1000.0000 ug | Freq: Once | INTRAMUSCULAR | Status: AC
Start: 2024-01-14 — End: 2024-01-14
  Administered 2024-01-14: 1000 ug via INTRAMUSCULAR

## 2024-01-14 NOTE — Progress Notes (Signed)
 Patient here for B-12 injection. Patient tolerated well with no concerns. Next appointment already scheduled.

## 2024-01-17 MED ORDER — TIRZEPATIDE-WEIGHT MANAGEMENT 10 MG/0.5ML ~~LOC~~ SOLN: 10.0000 mg | SUBCUTANEOUS | 0 refills | Status: DC

## 2024-01-17 NOTE — Addendum Note (Signed)
 Addended by: Bambi Lever A on: 01/17/2024 09:07 AM   Modules accepted: Orders

## 2024-01-21 ENCOUNTER — Ambulatory Visit (INDEPENDENT_AMBULATORY_CARE_PROVIDER_SITE_OTHER)

## 2024-01-21 ENCOUNTER — Ambulatory Visit: Admitting: Internal Medicine

## 2024-01-21 DIAGNOSIS — E538 Deficiency of other specified B group vitamins: Secondary | ICD-10-CM | POA: Diagnosis not present

## 2024-01-21 MED ORDER — CYANOCOBALAMIN 1000 MCG/ML IJ SOLN
1000.0000 ug | Freq: Once | INTRAMUSCULAR | Status: AC
Start: 2024-01-21 — End: 2024-01-21
  Administered 2024-01-21: 1000 ug via INTRAMUSCULAR

## 2024-01-21 NOTE — Progress Notes (Signed)
  B12 1000mcg given IM and pt tolerated injection well.

## 2024-01-31 ENCOUNTER — Ambulatory Visit (INDEPENDENT_AMBULATORY_CARE_PROVIDER_SITE_OTHER)

## 2024-01-31 ENCOUNTER — Ambulatory Visit: Admitting: Internal Medicine

## 2024-01-31 DIAGNOSIS — E538 Deficiency of other specified B group vitamins: Secondary | ICD-10-CM

## 2024-01-31 MED ORDER — CYANOCOBALAMIN 1000 MCG/ML IJ SOLN
100.0000 ug | Freq: Once | INTRAMUSCULAR | Status: AC
  Administered 2024-01-31: 100 ug via INTRAMUSCULAR

## 2024-01-31 MED ORDER — CYANOCOBALAMIN 1000 MCG/ML IJ SOLN: 100.0000 ug | Freq: Once | INTRAMUSCULAR | Status: DC

## 2024-01-31 NOTE — Progress Notes (Signed)
 Pt here for weekly B12 injection per Dr.Crawford  B12 1000mcg given IM and pt tolerated injection well.  Next B12 injection scheduled for --  Has OV with provider scheduled

## 2024-02-10 MED ORDER — TIRZEPATIDE-WEIGHT MANAGEMENT 10 MG/0.5ML ~~LOC~~ SOLN: 10.0000 mg | SUBCUTANEOUS | 0 refills | Status: DC

## 2024-02-10 NOTE — Addendum Note (Signed)
 Addended by: ROSALVA LEX RAMAN on: 02/10/2024 08:59 AM   Modules accepted: Orders

## 2024-02-11 NOTE — Telephone Encounter (Signed)
 Is patient suppose to be taken 1ml for 14 days or 2ml for 28 days?

## 2024-02-16 ENCOUNTER — Encounter: Payer: Self-pay | Admitting: Internal Medicine

## 2024-02-16 ENCOUNTER — Ambulatory Visit: Admitting: Internal Medicine

## 2024-02-16 VITALS — BP 138/80 | HR 76 | Temp 98.7°F | Ht 67.0 in | Wt 216.0 lb

## 2024-02-16 DIAGNOSIS — Z8639 Personal history of other endocrine, nutritional and metabolic disease: Secondary | ICD-10-CM | POA: Diagnosis not present

## 2024-02-16 DIAGNOSIS — H60391 Other infective otitis externa, right ear: Secondary | ICD-10-CM

## 2024-02-16 DIAGNOSIS — H6090 Unspecified otitis externa, unspecified ear: Secondary | ICD-10-CM | POA: Insufficient documentation

## 2024-02-16 DIAGNOSIS — E538 Deficiency of other specified B group vitamins: Secondary | ICD-10-CM | POA: Diagnosis not present

## 2024-02-16 LAB — VITAMIN B12: Vitamin B-12: 378 pg/mL (ref 211–911)

## 2024-02-16 LAB — VITAMIN D 25 HYDROXY (VIT D DEFICIENCY, FRACTURES): VITD: 84.13 ng/mL (ref 30.00–100.00)

## 2024-02-16 MED ORDER — TRIAMCINOLONE ACETONIDE 0.1 % EX CREA: 1.0000 | TOPICAL_CREAM | Freq: Two times a day (BID) | CUTANEOUS | 6 refills | Status: AC

## 2024-02-16 MED ORDER — TIRZEPATIDE-WEIGHT MANAGEMENT 10 MG/0.5ML ~~LOC~~ SOLN: 10.0000 mg | SUBCUTANEOUS | 3 refills | Status: DC

## 2024-02-16 MED ORDER — HYDROCORTISONE-ACETIC ACID 1-2 % OT SOLN: 3.0000 [drp] | Freq: Two times a day (BID) | OTIC | 0 refills | Status: DC

## 2024-02-16 NOTE — Assessment & Plan Note (Signed)
 Prior levels >120. She has stopped all over for 4 months now. Checking vitamin D  level.

## 2024-02-16 NOTE — Progress Notes (Signed)
   Subjective:   Patient ID: Stephanie Andrade, female    DOB: [birth year], 68 y.o.   MRN: [id number]  HPI The patient is a 68 YO female coming in for fatigue ongoing. Had low B12 and did 4 shots to replace did not notice a difference. She is also having right ear pain. Some random bruising she is unsure but maybe stopped when she stopped vitamin d  over the counter.   Review of Systems  Constitutional: Negative.   HENT:  Positive for ear pain.   Eyes: Negative.   Respiratory:  Negative for cough, chest tightness and shortness of breath.   Cardiovascular:  Negative for chest pain, palpitations and leg swelling.  Gastrointestinal:  Negative for abdominal distention, abdominal pain, constipation, diarrhea, nausea and vomiting.  Musculoskeletal: Negative.   Skin: Negative.   Neurological: Negative.   Psychiatric/Behavioral: Negative.      Objective:  Physical Exam Constitutional:      Appearance: She is well-developed.  HENT:     Head: Normocephalic and atraumatic.     Left Ear: Tympanic membrane normal.     Ears:     Comments: Right ear bulging TM with cloudy fluid Cardiovascular:     Rate and Rhythm: Normal rate and regular rhythm.  Pulmonary:     Effort: Pulmonary effort is normal. No respiratory distress.     Breath sounds: Normal breath sounds. No wheezing or rales.  Abdominal:     General: Bowel sounds are normal. There is no distension.     Palpations: Abdomen is soft.     Tenderness: There is no abdominal tenderness. There is no rebound.  Musculoskeletal:     Cervical back: Normal range of motion.  Skin:    General: Skin is warm and dry.  Neurological:     Mental Status: She is alert and oriented to person, place, and time.     Coordination: Coordination normal.     Vitals:   02/16/24 0924  BP: 138/80  Pulse: 76  Temp: 98.7 F (37.1 C)  TempSrc: Oral  SpO2: 98%  Weight: 216 lb (98 kg)  Height: 5' 7 (1.702 m)    Assessment & Plan:

## 2024-02-16 NOTE — Patient Instructions (Addendum)
 We will recheck the B12 and D levels today.  We have sent in the 3 month supply of zepbound .  We have sent in the ear drops to use 3 drops twice a day for 5 days.

## 2024-02-16 NOTE — Assessment & Plan Note (Signed)
 Has completed B12 replacement and tingling in toes is gone. Fatigue is unchanged. Taking oral replacement currently. Checking B12 level and adjust as needed.

## 2024-02-16 NOTE — Assessment & Plan Note (Signed)
 She would like to continue zepbound  10 mg weekly and 3 month supply of vials sent [facility] per patient request.

## 2024-02-16 NOTE — Assessment & Plan Note (Signed)
 Rx acetic acid/hydrocortisone otic solution to use in the right ear.

## 2024-02-21 ENCOUNTER — Ambulatory Visit: Payer: Self-pay | Admitting: Internal Medicine

## 2024-02-23 MED ORDER — NEOMYCIN-POLYMYXIN-HC 3.5-10000-1 OT SUSP: 3.0000 [drp] | Freq: Three times a day (TID) | OTIC | 0 refills | Status: DC

## 2024-04-19 DIAGNOSIS — Z1211 Encounter for screening for malignant neoplasm of colon: Secondary | ICD-10-CM | POA: Diagnosis not present

## 2024-04-25 LAB — COLOGUARD: COLOGUARD: NEGATIVE

## 2024-04-26 ENCOUNTER — Ambulatory Visit: Payer: Self-pay | Admitting: Nurse Practitioner

## 2024-05-04 ENCOUNTER — Encounter: Payer: Self-pay | Admitting: Internal Medicine

## 2024-05-04 NOTE — Telephone Encounter (Signed)
**Note De-identified  [place] Obfuscation** Please advise 

## 2024-05-05 ENCOUNTER — Other Ambulatory Visit: Payer: Self-pay | Admitting: Family

## 2024-05-05 MED ORDER — ZEPBOUND 12.5 MG/0.5ML ~~LOC~~ SOAJ: 12.5000 mg | SUBCUTANEOUS | 1 refills | Status: DC

## 2024-05-17 ENCOUNTER — Other Ambulatory Visit: Payer: Self-pay | Admitting: Physician Assistant

## 2024-05-19 ENCOUNTER — Other Ambulatory Visit: Payer: Self-pay

## 2024-05-19 MED ORDER — ZEPBOUND 12.5 MG/0.5ML ~~LOC~~ SOLN: 12.5000 mg | SUBCUTANEOUS | 2 refills | Status: AC

## 2024-05-19 NOTE — Telephone Encounter (Signed)
 I don't see the dosage of 12.5 for the vial

## 2024-06-12 ENCOUNTER — Other Ambulatory Visit: Payer: Self-pay | Admitting: Internal Medicine

## 2024-06-14 ENCOUNTER — Ambulatory Visit: Payer: Self-pay

## 2024-06-14 NOTE — Telephone Encounter (Signed)
 FYI Only or Action Required?: Action required by provider: request for appointment and clinical question for provider.  Patient was last seen in primary care on 02/16/2024 by Rollene Almarie LABOR, MD.  Called Nurse Triage reporting No chief complaint on file..  Symptoms began several days ago.  Interventions attempted: OTC medications: eye drops.  Symptoms are: unchanged.  Triage Disposition: See HCP Within 4 Hours (Or PCP Triage)  Patient/caregiver understands and will follow disposition?: No, wishes to speak with PCP   Copied from CRM #[id number]. Topic: Clinical - Red Word Triage >> Jun 14, 2024  1:27 PM Deaijah H wrote: Red Word that prompted transfer to Nurse Triage: Eyes constantly watering. Eye drop and OTC not working. Messing with vision, becoming gritty and Spot on her back that she would like looked at Reason for Disposition  [1] Blurred vision AND [2] new or worsening  Answer Assessment - Initial Assessment Questions No available appts today. Advised UC today and ED if symptoms worsen.  Patient declines UC and requests appointment next week/Call back.  Previously seen provider, drops effective ran out, otc med not effective; reports blurred vision and gritty eyes new symptoms  Eyes constantly watery and hurting from being rubbed, eyes feels gritty, like sand in eyes.  1. SEVERITY: How bad is the itching?  (e.g., Scale 1-10; mild, moderate or severe)     no 2. ONSET: When did the eye symptoms start? (e.g., hours or days ago)     3 weeks ago 3. EYELIDS: Are the eyelids swollen? If Yes, ask: How much?     White of eyes are off white, pale grayish, it's not normal; denies redness to eyes or eyelids, swelling 4. EYE DISCHARGE: Is there any discharge from the eye, or eyelid crusting? If Yes, ask: How much?     clear 5. TRIGGER: What do you think triggered the allergic reaction? (e.g., animal dander, dust, pollen, smoke; eyelash extensions, new eye make-up,  tattooed eyeliner)     unsure 6. RECURRENT PROBLEM: Have you experienced eye allergies before? If Yes, ask: When was the last time? and What medicine or treatment worked best in the past?     yes 7. CONTACT LENS: Do you wear contacts?  Disposable or extended wear? Do you use preservative-free lens solution?     Wears glass, last eye exam 2023 8. OTHER SYMPTOMS: Do you have any other symptoms? (e.g., eye redness, runny nose)     Intermittent blurred vision due to constant watery, HA, Denies weakness/ numbness, no difficulty walking, dizziness  Protocols used: Eye - Allergy-A-AH

## 2024-06-15 NOTE — Telephone Encounter (Signed)
 Appointment scheduled for 11/5@10am . Patient is aware.

## 2024-06-19 ENCOUNTER — Encounter: Payer: Self-pay | Admitting: Radiology

## 2024-06-21 ENCOUNTER — Encounter: Payer: Self-pay | Admitting: Internal Medicine

## 2024-06-21 ENCOUNTER — Ambulatory Visit: Admitting: Internal Medicine

## 2024-06-21 VITALS — BP 110/68 | HR 74 | Temp 98.2°F | Ht 67.0 in | Wt 204.6 lb

## 2024-06-21 DIAGNOSIS — H1013 Acute atopic conjunctivitis, bilateral: Secondary | ICD-10-CM | POA: Diagnosis not present

## 2024-06-21 MED ORDER — OLOPATADINE HCL 0.2 % OP SOLN: OPHTHALMIC | 0 refills | Status: AC

## 2024-06-21 NOTE — Progress Notes (Signed)
   Subjective:   Patient ID: Stephanie Andrade, female    DOB: [birth year], 68 y.o.   MRN: [id number]  Discussed the use of AI scribe software for clinical note transcription with the patient, who gave verbal consent to proceed.  History of Present Illness Stephanie Andrade is a 68 year old female who presents with persistent eye irritation and watering.  She has been experiencing eye irritation and watering for several weeks, predominantly affecting the left eye more than the right. The sensation is described as 'watering and dripping' with a feeling of pressure and ache, but no itching or crusting in the morning. Symptoms are more pronounced in the morning and evening, with some relief during the day.  She initially used eye drops prescribed previously, believed to be trimethoprim , but noted no improvement. Subsequently, she tried over-the-counter allergy drops without relief. No specific triggers for her symptoms have been identified, and she has been unable to secure an earlier appointment with an eye specialist.  The patient denies associated sinus or allergy symptoms such as nasal dripping, ear discomfort, or throat issues. She is not currently taking any allergy medications.     Review of Systems  Constitutional: Negative.   HENT: Negative.    Eyes:  Positive for discharge.  Respiratory:  Negative for cough, chest tightness and shortness of breath.   Cardiovascular:  Negative for chest pain, palpitations and leg swelling.  Gastrointestinal:  Negative for abdominal distention, abdominal pain, constipation, diarrhea, nausea and vomiting.  Musculoskeletal: Negative.   Skin: Negative.   Neurological: Negative.   Psychiatric/Behavioral: Negative.      Objective:  Physical Exam Constitutional:      Appearance: She is well-developed.  HENT:     Head: Normocephalic and atraumatic.  Eyes:     Comments: Clear watery discharge present on exam without crusting or redness to  conjunctivae  Cardiovascular:     Rate and Rhythm: Normal rate and regular rhythm.  Pulmonary:     Effort: Pulmonary effort is normal. No respiratory distress.     Breath sounds: Normal breath sounds. No wheezing or rales.  Abdominal:     General: Bowel sounds are normal. There is no distension.     Palpations: Abdomen is soft.     Tenderness: There is no abdominal tenderness.  Musculoskeletal:     Cervical back: Normal range of motion.  Skin:    General: Skin is warm and dry.  Neurological:     Mental Status: She is alert and oriented to person, place, and time.     Coordination: Coordination normal.     Vitals:   06/21/24 1003  BP: 110/68  Pulse: 74  Temp: 98.2 F (36.8 C)  TempSrc: Oral  SpO2: 98%  Weight: 204 lb 9.6 oz (92.8 kg)  Height: 5' 7 (1.702 m)    Assessment and Plan Assessment & Plan Allergic conjunctival symptoms (bilateral, left > right)   Chronic bilateral allergic conjunctival symptoms, more pronounced in the left eye, likely due to seasonal allergies. Previous antibacterial eye drops were ineffective. Prescribed Pataday (olopatadine) eye drops, 1-2 drops in each eye twice daily. Recommend trial of over-the-counter non-drowsy antihistamine such as Claritin for 1-2 weeks.

## 2024-06-21 NOTE — Patient Instructions (Addendum)
 We have sent in the eye drops to use 2 drops per eye twic a day. I would recommend to try claritin as well to help this improve.

## 2024-07-18 ENCOUNTER — Encounter: Payer: Self-pay | Admitting: Internal Medicine

## 2024-07-18 DIAGNOSIS — H109 Unspecified conjunctivitis: Secondary | ICD-10-CM

## 2024-07-19 ENCOUNTER — Encounter: Payer: Self-pay | Admitting: Internal Medicine

## 2024-07-19 ENCOUNTER — Telehealth: Payer: Self-pay

## 2024-07-19 ENCOUNTER — Ambulatory Visit: Admitting: Internal Medicine

## 2024-07-19 ENCOUNTER — Other Ambulatory Visit (HOSPITAL_COMMUNITY): Payer: Self-pay

## 2024-07-19 VITALS — BP 124/60 | HR 75 | Temp 98.2°F | Ht 67.0 in | Wt 203.0 lb

## 2024-07-19 DIAGNOSIS — R7303 Prediabetes: Secondary | ICD-10-CM

## 2024-07-19 DIAGNOSIS — I10 Essential (primary) hypertension: Secondary | ICD-10-CM | POA: Diagnosis not present

## 2024-07-19 DIAGNOSIS — H1033 Unspecified acute conjunctivitis, bilateral: Secondary | ICD-10-CM

## 2024-07-19 DIAGNOSIS — H109 Unspecified conjunctivitis: Secondary | ICD-10-CM | POA: Insufficient documentation

## 2024-07-19 MED ORDER — NEOMYCIN-POLYMYXIN-HC OP SUSP: OPHTHALMIC | 0 refills | Status: AC

## 2024-07-19 NOTE — Assessment & Plan Note (Signed)
 Lab Results  Component Value Date   HGBA1C 5.3 10/12/2023   Stable, pt to continue current medical treatment  - diet,wt control

## 2024-07-19 NOTE — Assessment & Plan Note (Signed)
 BP Readings from Last 3 Encounters:  07/19/24 124/60  06/21/24 110/68  02/16/24 138/80   Stable, pt to continue medical treatment losartan  50 mg qd

## 2024-07-19 NOTE — Assessment & Plan Note (Signed)
 Mild to mod, left >> right, for antibx course - cortisporin op qid asd,  to f/u any worsening symptoms or concerns

## 2024-07-19 NOTE — Telephone Encounter (Signed)
 Copied from CRM #[id number]. Topic: Clinical - Prescription Issue >> Jul 19, 2024  1:07 PM Robinson H wrote: Reason for CRM: Garrel with [facility] regarding the prescription sent over for the NEOMYCIN -POLYMYXIN-HC, OPHTH, SUSP, states medication isn't covered through insurance and needs an alternative. Patient states she had something similar happen early this year and medication was changed to something different called Poly trim  [facility] [phone]

## 2024-07-19 NOTE — Progress Notes (Signed)
 Patient ID: Stephanie Andrade, female   DOB: 17-Jan-1956, 68 y.o.   MRN: [id number]        Chief Complaint: follow up left > right conjunctivitis       HPI:  Stephanie Andrade is a 68 y.o. female here with c/o 3 days onset gradually worsening left conjunctival redness, weepy and crusty worse in the AM.  Now right eye is starting to be involved today.  No fever, vision change, HA, sinus pain or ST or cough.        Wt Readings from Last 3 Encounters:  07/19/24 203 lb (92.1 kg)  06/21/24 204 lb 9.6 oz (92.8 kg)  02/16/24 216 lb (98 kg)   BP Readings from Last 3 Encounters:  07/19/24 124/60  06/21/24 110/68  02/16/24 138/80         Past Medical History:  Diagnosis Date   Allergy 2003   Anemia    Arthritis    Asthma    Depression    Family history of adverse reaction to anesthesia    sister, brother - PONV   Family history of ovarian cancer    Fibroids    GERD (gastroesophageal reflux disease)    occ   History of bronchitis    History of sarcoidosis    Hypertension    Pneumonia    hx   PONV (postoperative nausea and vomiting) 03/26/2016   Wears glasses    Past Surgical History:  Procedure Laterality Date   CHOLECYSTECTOMY N/A 09/22/2016   Procedure: LAPAROSCOPIC CHOLECYSTECTOMY WITH INTRAOPERATIVE CHOLANGIOGRAM;  Surgeon: Debby Shipper, MD;  Location: [facility] OR;  Service: General;  Laterality: N/A;   COLONOSCOPY     ESOPHAGOGASTRODUODENOSCOPY     JOINT REPLACEMENT     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   Procedure: RIGHT TOTAL HIP ARTHROPLASTY ANTERIOR APPROACH;  Surgeon: Lonni CINDERELLA Poli, MD;  Location: [facility] OR;  Service: Orthopedics;  Laterality: Right;   TOTAL HIP ARTHROPLASTY Left 08/24/2017   TOTAL HIP ARTHROPLASTY Left 08/24/2017   Procedure: LEFT TOTAL HIP ARTHROPLASTY ANTERIOR APPROACH;  Surgeon: Poli Lonni CINDERELLA, MD;  Location: [facility] OR;  Service: Orthopedics;  Laterality: Left;    reports that she quit smoking about 15 years ago. Her smoking use included  cigarettes. She has never used smokeless tobacco. She reports current alcohol use of about 1.0 standard drink of alcohol per week. She reports that she does not use drugs. family history includes Alcohol abuse in her brother; Arthritis in her brother, father, and sister; Breast cancer in her maternal grandfather; Cancer in her mother; Drug abuse in her brother; Hypertension in her brother and father; Obesity in her brother and sister. Allergies  Allergen Reactions   Mushroom Extract Complex (Obsolete) Anaphylaxis and Hives   Agave Nausea And Vomiting   Codeine Nausea Only   Dextromethorphan Nausea And Vomiting   Levaquin  [Levofloxacin  In D5w] Other (See Comments)    Myalgias   Macrobid  [Nitrofurantoin Macrocrystal] Nausea And Vomiting   Macrobid [Nitrofurantoin] Nausea And Vomiting   Tape Itching and Other (See Comments)    Please use paper tape   Tessalon Perles Nausea And Vomiting   Current Outpatient Medications on File Prior to Visit  Medication Sig Dispense Refill   azelastine  (ASTELIN ) 0.1 % nasal spray Place 1 spray into both nostrils 2 (two) times daily. Use in each nostril as directed 30 mL 3   buPROPion  (WELLBUTRIN  XL) 150 MG 24 hr tablet Take 3 tablets (450 mg total) by mouth daily. 270  tablet 3   celecoxib  (CELEBREX ) 200 MG capsule Take 1 capsule (200 mg total) by mouth 2 (two) times daily as needed. 60 capsule 1   cyclobenzaprine  (FLEXERIL ) 10 MG tablet Take 1 tablet (10 mg total) by mouth at bedtime. 30 tablet 2   diclofenac  sodium (VOLTAREN ) 1 % GEL Apply 2 g topically 4 (four) times daily. 100 g 3   docusate sodium  (COLACE) 100 MG capsule Take 100 mg by mouth daily.     EPINEPHrine  0.3 mg/0.3 mL IJ SOAJ injection Inject 0.3 mg into the muscle as needed for anaphylaxis. 2 each 1   gabapentin  (NEURONTIN ) 300 MG capsule Take 1 capsule (300 mg total) by mouth at bedtime. 90 capsule 3   glucosamine-chondroitin 500-400 MG tablet Take 1 tablet by mouth 3 (three) times daily.      losartan  (COZAAR ) 50 MG tablet Take 1 tablet (50 mg total) by mouth daily. 90 tablet 3   Multiple Vitamin (MULTI-VITAMIN PO) Take 1 tablet by mouth daily.     mupirocin  ointment (BACTROBAN ) 2 % Place 1 application into the nose 2 (two) times daily. 22 g 0   neomycin -polymyxin-hydrocortisone  (CORTISPORIN) 3.5-10000-1 OTIC suspension Place 3 drops into both ears 3 (three) times daily. 10 mL 0   nitroGLYCERIN  (NITRODUR - DOSED IN MG/24 HR) 0.2 mg/hr patch Use 1/4 patch daily to the affected area. 30 patch 1   Olopatadine  HCl 0.2 % SOLN Use 2 drops per eye twice a day 2.5 mL 0   Tirzepatide -Weight Management (ZEPBOUND ) 12.5 MG/0.5ML SOLN Inject 12.5 mg into the skin once a week. 2 mL 2   triamcinolone  cream (KENALOG ) 0.1 % Apply 1 Application topically 2 (two) times daily. 100 g 6   zolpidem  (AMBIEN ) 5 MG tablet Take 1 tablet (5 mg total) by mouth at bedtime as needed. for sleep 90 tablet 0   No current facility-administered medications on file prior to visit.        ROS:  All others reviewed and negative.  Objective        PE:  BP 124/60 (BP Location: Right Arm, Patient Position: Sitting, Cuff Size: Normal)   Pulse 75   Temp 98.2 F (36.8 C) (Oral)   Ht 5' 7 (1.702 m)   Wt 203 lb (92.1 kg)   LMP 04/06/2011 (Exact Date)   SpO2 98%   BMI 31.79 kg/m                 Constitutional: Pt appears in NAD               HENT: Head: NCAT.                Right Ear: External ear normal.  Right conjunctiva mild erythema               Left Ear: External ear normal. Left conjunctiva with marked erythema, weepiness               Eyes: . Pupils are equal, round, and reactive to light. Conjunctivae and EOM are normal               Nose: without d/c or deformity               Neck: Neck supple. Gross normal ROM               Cardiovascular: Normal rate and regular rhythm.                 Pulmonary/Chest: Effort  normal and breath sounds without rales or wheezing.                               Neurological: Pt is alert. At baseline orientation, motor grossly intact               Skin: Skin is warm. No rashes, no other new lesions, LE edema - none               Psychiatric: Pt behavior is normal without agitation   Micro: none  Cardiac tracings I have personally interpreted today:  none  Pertinent Radiological findings (summarize): none   Lab Results  Component Value Date   WBC 7.9 10/12/2023   HGB 13.2 10/12/2023   HCT 40.2 10/12/2023   PLT 272.0 10/12/2023   GLUCOSE 75 10/12/2023   CHOL 166 10/12/2023   TRIG 74.0 10/12/2023   HDL 82.20 10/12/2023   LDLCALC 69 10/12/2023   ALT 9 10/12/2023   AST 13 10/12/2023   NA 140 10/12/2023   K 4.6 10/12/2023   CL 102 10/12/2023   CREATININE 0.87 10/12/2023   BUN 23 10/12/2023   CO2 29 10/12/2023   TSH 0.76 10/12/2023   HGBA1C 5.3 10/12/2023   MICROALBUR 0.3 06/24/2018   Assessment/Plan:  Stephanie Andrade is a 50 y.o. White or Caucasian [1] female with  has a past medical history of Allergy (2003), Anemia, Arthritis, Asthma, Depression, Family history of adverse reaction to anesthesia, Family history of ovarian cancer, Fibroids, GERD (gastroesophageal reflux disease), History of bronchitis, History of sarcoidosis, Hypertension, Pneumonia, PONV (postoperative nausea and vomiting) (03/26/2016), and Wears glasses.  Bilateral conjunctivitis Mild to mod, left >> right, for antibx course - cortisporin op qid asd,  to f/u any worsening symptoms or concerns   Hypertension BP Readings from Last 3 Encounters:  07/19/24 124/60  06/21/24 110/68  02/16/24 138/80   Stable, pt to continue medical treatment losartan  50 mg qd   Prediabetes Lab Results  Component Value Date   HGBA1C 5.3 10/12/2023   Stable, pt to continue current medical treatment  - diet, wt control  Followup: Return if symptoms worsen or fail to improve.  Lynwood Rush, MD 07/19/2024 12:58 PM [facility]

## 2024-07-19 NOTE — Patient Instructions (Signed)
 Please take all new medication as prescribed- the antibiotic solution for the eyes  Please continue all other medications as before, and refills have been done if requested.  Please have the pharmacy call with any other refills you may need.  Please keep your appointments with your specialists as you may have planned

## 2024-07-20 MED ORDER — POLYMYXIN B-TRIMETHOPRIM 10000-0.1 UNIT/ML-% OP SOLN: 1.0000 [drp] | Freq: Four times a day (QID) | OPHTHALMIC | 0 refills | Status: AC

## 2024-07-20 NOTE — Telephone Encounter (Signed)
 LVM for pt to call office

## 2024-07-20 NOTE — Telephone Encounter (Unsigned)
 Copied from CRM #[id number]. Topic: Clinical - Prescription Issue >> Jul 19, 2024  5:02 PM Jasmin G wrote: Reason for CRM: Pt called to check on the status of her request for a med change due to not being covered by [employer], please refer to recent Encounters for more info. Pt requested for this to be done ASAP as she needs her med.

## 2024-07-25 NOTE — Telephone Encounter (Signed)
 This has been resolved. Please see previous note.

## 2024-10-04 ENCOUNTER — Ambulatory Visit: Payer: Medicare Other
